# Patient Record
Sex: Female | Born: 1965 | Race: White | Hispanic: No | Marital: Single | Smoking: Never smoker
Health system: Southern US, Community
[De-identification: ages and names within clinical notes are randomized; demographics above are authoritative.]

## PROBLEM LIST (undated history)

## (undated) DIAGNOSIS — D472 Monoclonal gammopathy: Secondary | ICD-10-CM

## (undated) DIAGNOSIS — H8109 Meniere's disease, unspecified ear: Secondary | ICD-10-CM

## (undated) DIAGNOSIS — I2510 Atherosclerotic heart disease of native coronary artery without angina pectoris: Secondary | ICD-10-CM

## (undated) DIAGNOSIS — N390 Urinary tract infection, site not specified: Secondary | ICD-10-CM

## (undated) DIAGNOSIS — R7303 Prediabetes: Secondary | ICD-10-CM

## (undated) DIAGNOSIS — G5603 Carpal tunnel syndrome, bilateral upper limbs: Secondary | ICD-10-CM

## (undated) DIAGNOSIS — M791 Myalgia, unspecified site: Secondary | ICD-10-CM

## (undated) DIAGNOSIS — N200 Calculus of kidney: Secondary | ICD-10-CM

## (undated) DIAGNOSIS — G8929 Other chronic pain: Secondary | ICD-10-CM

## (undated) DIAGNOSIS — G43909 Migraine, unspecified, not intractable, without status migrainosus: Secondary | ICD-10-CM

## (undated) HISTORY — DX: Calculus of kidney: N20.0

## (undated) HISTORY — PX: CHOLECYSTECTOMY: SHX55

## (undated) HISTORY — DX: Meniere's disease, unspecified ear: H81.09

## (undated) HISTORY — DX: Myalgia, unspecified site: M79.10

## (undated) HISTORY — PX: APPENDECTOMY: SHX54

## (undated) HISTORY — DX: Monoclonal gammopathy: D47.2

## (undated) HISTORY — DX: Migraine, unspecified, not intractable, without status migrainosus: G43.909

## (undated) HISTORY — PX: ESOPHAGOGASTRODUODENOSCOPY: SHX1529

---
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
## 1998-04-22 ENCOUNTER — Other Ambulatory Visit: Admission: RE | Admit: 1998-04-22 | Discharge: 1998-04-22 | Payer: Self-pay | Admitting: Obstetrics and Gynecology

## 2000-07-09 ENCOUNTER — Other Ambulatory Visit: Admission: RE | Admit: 2000-07-09 | Discharge: 2000-07-09 | Payer: Self-pay | Admitting: Obstetrics and Gynecology

## 2005-06-30 ENCOUNTER — Ambulatory Visit (HOSPITAL_COMMUNITY): Admission: RE | Admit: 2005-06-30 | Discharge: 2005-06-30 | Payer: Self-pay | Admitting: Family Medicine

## 2005-07-02 ENCOUNTER — Ambulatory Visit (HOSPITAL_COMMUNITY): Admission: RE | Admit: 2005-07-02 | Discharge: 2005-07-02 | Payer: Self-pay | Admitting: Family Medicine

## 2005-11-09 ENCOUNTER — Other Ambulatory Visit: Admission: RE | Admit: 2005-11-09 | Discharge: 2005-11-09 | Payer: Self-pay | Admitting: Obstetrics & Gynecology

## 2005-11-17 ENCOUNTER — Encounter: Admission: RE | Admit: 2005-11-17 | Discharge: 2005-11-17 | Payer: Self-pay | Admitting: Orthopedic Surgery

## 2005-11-27 ENCOUNTER — Ambulatory Visit (HOSPITAL_COMMUNITY): Admission: RE | Admit: 2005-11-27 | Discharge: 2005-11-27 | Payer: Self-pay | Admitting: Obstetrics & Gynecology

## 2006-04-26 ENCOUNTER — Encounter: Admission: RE | Admit: 2006-04-26 | Discharge: 2006-04-26 | Payer: Self-pay | Admitting: Obstetrics & Gynecology

## 2006-07-28 ENCOUNTER — Emergency Department (HOSPITAL_COMMUNITY): Admission: EM | Admit: 2006-07-28 | Discharge: 2006-07-28 | Payer: Self-pay | Admitting: Emergency Medicine

## 2006-09-27 HISTORY — PX: COLONOSCOPY W/ ENDOSCOPIC US: SHX1379

## 2007-05-02 ENCOUNTER — Ambulatory Visit (HOSPITAL_COMMUNITY): Admission: RE | Admit: 2007-05-02 | Discharge: 2007-05-02 | Payer: Self-pay | Admitting: Family Medicine

## 2007-09-06 ENCOUNTER — Other Ambulatory Visit: Admission: RE | Admit: 2007-09-06 | Discharge: 2007-09-06 | Payer: Self-pay | Admitting: Obstetrics & Gynecology

## 2007-12-05 ENCOUNTER — Ambulatory Visit: Payer: Self-pay | Admitting: Hematology and Oncology

## 2007-12-16 LAB — CBC WITH DIFFERENTIAL/PLATELET
BASO%: 0.6 % (ref 0.0–2.0)
EOS%: 2.1 % (ref 0.0–7.0)
LYMPH%: 38.1 % (ref 14.0–48.0)
MCH: 26.5 pg (ref 26.0–34.0)
MCHC: 34.1 g/dL (ref 32.0–36.0)
MONO#: 0.4 10*3/uL (ref 0.1–0.9)
RBC: 4.97 10*6/uL (ref 3.70–5.32)
WBC: 6 10*3/uL (ref 3.9–10.0)
lymph#: 2.3 10*3/uL (ref 0.9–3.3)

## 2007-12-16 LAB — ERYTHROCYTE SEDIMENTATION RATE: Sed Rate: 5 mm/hr (ref 0–30)

## 2007-12-21 ENCOUNTER — Ambulatory Visit (HOSPITAL_COMMUNITY): Admission: RE | Admit: 2007-12-21 | Discharge: 2007-12-21 | Payer: Self-pay | Admitting: Hematology and Oncology

## 2007-12-21 LAB — SPEP & IFE WITH QIG
Albumin ELP: 63.2 % (ref 55.8–66.1)
Beta 2: 5.3 % (ref 3.2–6.5)
Beta Globulin: 5.9 % (ref 4.7–7.2)
Gamma Globulin: 11.7 % (ref 11.1–18.8)
IgA: 236 mg/dL (ref 68–378)
IgG (Immunoglobin G), Serum: 860 mg/dL (ref 694–1618)
IgM, Serum: 67 mg/dL (ref 60–263)
M-Spike, %: 0.23 g/dL

## 2007-12-21 LAB — COMPREHENSIVE METABOLIC PANEL
ALT: 28 U/L (ref 0–35)
AST: 25 U/L (ref 0–37)
Alkaline Phosphatase: 44 U/L (ref 39–117)
Calcium: 9.2 mg/dL (ref 8.4–10.5)
Chloride: 105 mEq/L (ref 96–112)
Creatinine, Ser: 0.77 mg/dL (ref 0.40–1.20)
Potassium: 4.3 mEq/L (ref 3.5–5.3)

## 2007-12-21 LAB — KAPPA/LAMBDA LIGHT CHAINS
Kappa:Lambda Ratio: 0.99 (ref 0.26–1.65)
Lambda Free Lght Chn: 1.05 mg/dL (ref 0.57–2.63)

## 2007-12-23 LAB — UIFE/LIGHT CHAINS/TP QN, 24-HR UR
Albumin, U: DETECTED
Total Protein, Urine-Ur/day: 14 mg/d (ref 10–140)

## 2007-12-25 ENCOUNTER — Emergency Department (HOSPITAL_COMMUNITY): Admission: EM | Admit: 2007-12-25 | Discharge: 2007-12-25 | Payer: Self-pay | Admitting: Emergency Medicine

## 2008-05-10 ENCOUNTER — Encounter: Admission: RE | Admit: 2008-05-10 | Discharge: 2008-05-10 | Payer: Self-pay | Admitting: Orthopedic Surgery

## 2008-05-24 ENCOUNTER — Encounter: Admission: RE | Admit: 2008-05-24 | Discharge: 2008-05-24 | Payer: Self-pay | Admitting: Orthopedic Surgery

## 2008-07-27 ENCOUNTER — Ambulatory Visit: Payer: Self-pay | Admitting: Cardiovascular Disease

## 2008-07-27 ENCOUNTER — Ambulatory Visit: Payer: Self-pay

## 2008-08-06 ENCOUNTER — Encounter: Payer: Self-pay | Admitting: Cardiovascular Disease

## 2008-08-06 ENCOUNTER — Ambulatory Visit: Payer: Self-pay

## 2008-08-10 ENCOUNTER — Ambulatory Visit (HOSPITAL_COMMUNITY): Admission: RE | Admit: 2008-08-10 | Discharge: 2008-08-10 | Payer: Self-pay | Admitting: Cardiovascular Disease

## 2008-08-10 ENCOUNTER — Ambulatory Visit: Payer: Self-pay | Admitting: Internal Medicine

## 2008-09-03 ENCOUNTER — Ambulatory Visit: Payer: Self-pay | Admitting: Cardiovascular Disease

## 2008-09-10 ENCOUNTER — Ambulatory Visit: Payer: Self-pay | Admitting: Cardiovascular Disease

## 2008-12-12 ENCOUNTER — Ambulatory Visit: Payer: Self-pay | Admitting: Hematology and Oncology

## 2008-12-19 LAB — CBC WITH DIFFERENTIAL/PLATELET
EOS%: 2.2 % (ref 0.0–7.0)
Eosinophils Absolute: 0.2 10*3/uL (ref 0.0–0.5)
MCH: 26.2 pg (ref 25.1–34.0)
MCHC: 32.8 g/dL (ref 31.5–36.0)
MONO%: 6.2 % (ref 0.0–14.0)
NEUT#: 5.3 10*3/uL (ref 1.5–6.5)
RBC: 4.85 10*6/uL (ref 3.70–5.45)
RDW: 14.9 % — ABNORMAL HIGH (ref 11.2–14.5)
WBC: 8.5 10*3/uL (ref 3.9–10.3)
lymph#: 2.5 10*3/uL (ref 0.9–3.3)

## 2008-12-21 LAB — SPEP & IFE WITH QIG
Beta 2: 4.4 % (ref 3.2–6.5)
Beta Globulin: 6.8 % (ref 4.7–7.2)
Gamma Globulin: 11.6 % (ref 11.1–18.8)
IgA: 201 mg/dL (ref 68–378)

## 2008-12-21 LAB — COMPREHENSIVE METABOLIC PANEL
ALT: 39 U/L — ABNORMAL HIGH (ref 0–35)
AST: 25 U/L (ref 0–37)
Albumin: 4.5 g/dL (ref 3.5–5.2)
BUN: 12 mg/dL (ref 6–23)
CO2: 23 mEq/L (ref 19–32)
Chloride: 103 mEq/L (ref 96–112)
Glucose, Bld: 113 mg/dL — ABNORMAL HIGH (ref 70–99)
Potassium: 4.4 mEq/L (ref 3.5–5.3)
Total Protein: 6.8 g/dL (ref 6.0–8.3)

## 2009-02-08 ENCOUNTER — Encounter: Admission: RE | Admit: 2009-02-08 | Discharge: 2009-02-08 | Payer: Self-pay | Admitting: Sports Medicine

## 2009-03-05 ENCOUNTER — Encounter: Admission: RE | Admit: 2009-03-05 | Discharge: 2009-03-05 | Payer: Self-pay | Admitting: Sports Medicine

## 2009-03-14 ENCOUNTER — Encounter (INDEPENDENT_AMBULATORY_CARE_PROVIDER_SITE_OTHER): Payer: Self-pay | Admitting: *Deleted

## 2009-07-10 ENCOUNTER — Encounter: Admission: RE | Admit: 2009-07-10 | Discharge: 2009-07-10 | Payer: Self-pay | Admitting: Sports Medicine

## 2010-02-13 ENCOUNTER — Ambulatory Visit: Payer: Self-pay | Admitting: Hematology and Oncology

## 2010-03-06 ENCOUNTER — Ambulatory Visit (HOSPITAL_COMMUNITY): Admission: RE | Admit: 2010-03-06 | Discharge: 2010-03-06 | Payer: Self-pay | Admitting: Family Medicine

## 2010-04-29 ENCOUNTER — Ambulatory Visit: Payer: Self-pay | Admitting: Hematology and Oncology

## 2010-05-01 LAB — CBC WITH DIFFERENTIAL/PLATELET
BASO%: 0.9 % (ref 0.0–2.0)
EOS%: 1.6 % (ref 0.0–7.0)
Eosinophils Absolute: 0.1 10*3/uL (ref 0.0–0.5)
HCT: 41.1 % (ref 34.8–46.6)
HGB: 13.8 g/dL (ref 11.6–15.9)
LYMPH%: 35.6 % (ref 14.0–49.7)
MCH: 27.5 pg (ref 25.1–34.0)
MCHC: 33.7 g/dL (ref 31.5–36.0)
NEUT#: 3.5 10*3/uL (ref 1.5–6.5)
RBC: 5.03 10*6/uL (ref 3.70–5.45)
WBC: 6.3 10*3/uL (ref 3.9–10.3)

## 2010-05-05 LAB — IGG, IGA, IGM
IgA: 190 mg/dL (ref 68–378)
IgG (Immunoglobin G), Serum: 900 mg/dL (ref 694–1618)
IgM, Serum: 66 mg/dL (ref 60–263)

## 2010-05-05 LAB — PROTEIN ELECTROPHORESIS, SERUM
Albumin ELP: 61.7 % (ref 55.8–66.1)
Alpha-1-Globulin: 4 % (ref 2.9–4.9)
Beta 2: 5.1 % (ref 3.2–6.5)
Beta Globulin: 6.1 % (ref 4.7–7.2)
Gamma Globulin: 12.4 % (ref 11.1–18.8)

## 2010-05-05 LAB — COMPREHENSIVE METABOLIC PANEL
AST: 20 U/L (ref 0–37)
BUN: 10 mg/dL (ref 6–23)
Calcium: 9.4 mg/dL (ref 8.4–10.5)
Potassium: 4.1 mEq/L (ref 3.5–5.3)
Total Bilirubin: 0.4 mg/dL (ref 0.3–1.2)

## 2010-11-01 ENCOUNTER — Encounter: Payer: Self-pay | Admitting: Obstetrics & Gynecology

## 2010-11-02 ENCOUNTER — Encounter: Payer: Self-pay | Admitting: Obstetrics & Gynecology

## 2010-11-02 ENCOUNTER — Encounter: Payer: Self-pay | Admitting: Cardiovascular Disease

## 2010-11-03 ENCOUNTER — Encounter: Payer: Self-pay | Admitting: Orthopedic Surgery

## 2010-12-11 ENCOUNTER — Ambulatory Visit (HOSPITAL_COMMUNITY)
Admission: RE | Admit: 2010-12-11 | Discharge: 2010-12-11 | Disposition: A | Discharge status: Home / Self Care | Payer: BC Managed Care – PPO | Source: Ambulatory Visit | Attending: Family Medicine | Admitting: Family Medicine

## 2010-12-11 ENCOUNTER — Other Ambulatory Visit (HOSPITAL_COMMUNITY): Payer: Self-pay | Admitting: Family Medicine

## 2010-12-11 DIAGNOSIS — R109 Unspecified abdominal pain: Secondary | ICD-10-CM | POA: Insufficient documentation

## 2010-12-11 DIAGNOSIS — K5909 Other constipation: Secondary | ICD-10-CM | POA: Insufficient documentation

## 2011-02-24 NOTE — Assessment & Plan Note (Signed)
[facility]                            CARDIOLOGY OFFICE NOTE   DER, GAGLIANO                      MRN:          [id number]  DATE:09/03/2008                            DOB:          12/24/1965    Lisa Li is seen today in followup.  She has been seen for  palpitations, rapid heart rate, hypertension, and atypical chest pain.  Her cardiac tests were normal.  She has normal stress test.  Her CPX  test that showed that she had no cardiopulmonary limitations to her  exercise.   Her urine test did show elevated catechols.  Her total 24-hour urine  catechols were 128 with an upper limit of normal being 100.  Her  norepinephrine level was 125 with an upper limit of normal being less  than 80.  Total urine volume was 520, which was good.  Her total urine  volume was 2500, which was good.  A 24-hour urine dopamine was also  elevated at 520.   I told the patient that we needed to work this up further since she  tends to have hypertension and palpitations.  She will get a CT scan to  check her renals.  We will try to refer her to an endocrinologist.  I  would like to repeat her 24-hour urine to make sure these results are  reproducible and accurate.   I had a long discussion with Lisa Li about this.  She tends to have a lot  of questions.  I explained to her that I was not sure of any particular  diagnosis regarding FEO, but certainly we need to look into it further.  Her chest pain and palpitations seemed benign.  She does not appear to  have significant cardiac disease.  She had been on a beta-blocker before  for her blood pressure, but it caused alopecia and this was stopped.  Her blood pressure seems stable at this point.   REVIEW OF SYSTEMS:  Otherwise negative.   MEDICATIONS:  1. [name].  2. Vitamin D.  3. Fish oil.  4. Flexeril.  5. NuvaRing.   PHYSICAL EXAMINATION:  GENERAL:  Remarkable for young white female in no  distress.  She does have a bit of a flushed face. VITAL SIGNS:  Blood  pressure is 134/88, pulse 90 and regular, respiratory rate 14, and  afebrile.  HEENT:  Unremarkable.  NECK:  Carotids are normal without bruit.  No lymphadenopathy,  thyromegaly, or JVP elevation.  LUNGS:  Clear.  Good diaphragmatic motion.  No wheezing.  S1 and S2.  Normal heart sounds.  PMI normal.  ABDOMEN:  Benign.  Bowel sounds positive.  No AAA.  No tenderness.  No  bruit.  No hepatosplenomegaly.  No hepatojugular reflux.  EXTREMITIES:  Distal pulses are intact.  No edema.  NEURO:  Nonfocal.  SKIN:  Warm and dry.  MUSCULOSKELETAL:  No muscular weakness.   IMPRESSION:  1. Chest pain, atypical.  No evidence of significant coronary artery      disease.  Continue baby aspirin a day.  2. Dyspnea with cardiopulmonary  exercise showing no cardiopulmonary      limitation.  Continue exercise program.  3. Elevated catecholamines in the urine with a tendency towards      hypertension and palpitations.  At some point, we may try to get      her back on a beta-blocker that will not cause alopecia.  She will      have a repeat 24-hour urine and followup with endocrine and we will      do a CT of her adrenals.  4. Anxiety and depression.  Followup primary care MD.  5. History of reflux.  Continue Nexium.  6. Musculoskeletal pain.  Continue Flexeril p.r.n.  7. Birth control.  Continue a NuvaRing.     Noralyn Pick. Eden Emms, MD, [facility]  Electronically Signed    PCN/MedQ  DD: 09/03/2008  DT: 09/04/2008  Job #: [id number]

## 2011-02-24 NOTE — Assessment & Plan Note (Signed)
[facility]                            CARDIOLOGY OFFICE NOTE   Lisa Li, Lisa Li                      MRN:          [id number]  DATE:07/27/2008                            DOB:          [birth year]    [name] is a 45 year old patient referred by Lilyan Punt, I  actually took care of her mother while in the hospital up at [facility],  she had end-stage COPD.  Unfortunately, her father currently has  prostate cancer.   Lisa Li is 45 years old and she has been having issues with palpitations,  rapid heart rate, and hypertension.   She dates the issues with her heart rate and blood pressure back to you  in September 2006.  At that point she noticed a pounding in her ears at  night and thought that her heart rate was higher than usual.  She  indicates that she would always been physically active working multiple  jobs, particularly in the nursery.  She has since had progressive  fatigue and shortness of breath.  She has had dizziness.   She subsequently developed some paresthesias in her legs and some  chronic hip pain.  She saw Dr. Thurston Hole for her hip issues and saw Dr.  Sandria Manly.  She had a workup for what appears to be some sort of polyclonal  myopathy, but I do not have any of the notes.   In regards to her heart, she has had some atypical chest pain.  She  feels a squeezing sensation in her chest.  It is not necessarily  exertional and can radiate to the shoulders.   She feels that she gets dyspnea which is disproportionate to her  activity levels.   She also has had palpitations.  She feels that her heart beats too  quickly.   She has been seen by Dr. Gerda Diss and her blood pressure has been on the  high side.  She seems a bit sensitive to medication.  She only tolerates  half of a metoprolol 50 mg tablet.  Apparently, on higher doses, she  felt too fatigue.  She has not been tried on other blood pressure  medicines.   Her review of systems  is otherwise remarkable for some question of  recent onset of cognitive problems in regards to forgetting transferring  money to back accounts and slower mentation.   Otherwise, she has not had any frank syncope.  She has not had focal  neurological deficits.   Her past medical history is otherwise remarkable for previous  appendectomy, previous cholecystectomy, previous tonsillectomy.   The patient is single.  She checks in on her father every day.  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  She  works part-time as Copy at a coffee shop and is currently trying to  take courses to get into the [place] tech [employer] program.   FAMILY HISTORY:  Remarkable for mother dying at age 33 of emphysema.  Father is alive and has prostate cancer.   She is currently on [name], vitamin  D, metoprolol 25 a day,  fish oil, Flexeril, and NuvaRing.   PHYSICAL EXAMINATION:  GENERAL:  Her exam is remarkable for somewhat  anxious white female with a flushed face with pressurized speech.  VITAL SIGNS:  Her blood pressure is 140/88, pulse is 95-100, respiratory  rate 14, afebrile, weight 183.  HEENT:  Unremarkable.  Carotids are without bruit.  No lymphadenopathy  or thyromegaly, JVP elevation.  LUNGS:  Clear to diaphragmatic motion.  No wheezing.  S1 and S2, normal  heart sounds.  PMI normal.  ABDOMEN:  Benign.  Bowel sounds positive.  No AAA, no tenderness, no  bruit, no hepatosplenomegaly, hepatojugular reflux, no tenderness.  EXTREMITIES:  Distal pulse was intact.  No edema.  NEURO:  Nonfocal.  SKIN:  Warm and dry.  No muscular weakness.  Dr. Lorin Picket Luking's notes  were reviewed.  Lab work from his office was reviewed.  Her EKG is  totally normal.   Her lab work shows TSH of 3.  She is not anemic.  There is no renal  disease.   IMPRESSION:  1. Palpitations.  Check event monitor for 4 weeks.  Check 24-hour      urine for metanephrines.  2. Dyspnea.   Disproportionate to exam.  Check 2-D echocardiogram,      assess RV and LV function, rule out occult pulmonary hypertension.      Check CPX to see if she has a deconditioning response, the O2 max,      limitation or cardiac output limitation.  3. Relative tachycardia.  I think this is benign.  Her TSH is fine.      She is not anemic.  Continue low-dose beta-blocker.  4. Question recurrent neurological issues.  She may need to see Dr.      Sandria Manly again.  I wonder at some point if she should have an MRI of      her head given some of her neurological symptoms and possible more      central etiology to her high blood pressure.  5. History of reflux.  Continue [name].  6. Chronic hip pain.  Follow with Dr. Thurston Hole.  Continue Flexeril      p.r.n.  7. Likely anxiety and depression.  In talking to the patient she      really does seem that she has some anxiety about her health.  It      seems disproportionate to any actual physical findings.  I wonder      if this is part of the issue in regards to her blood pressure and      heart rate swings, hopefully Dr. Gerda Diss will address this with her      further after her testing is done.  I will see her back in 6 weeks.     Noralyn Pick. Eden Emms, MD, [facility]  Electronically Signed    PCN/MedQ  DD: 07/27/2008  DT: 07/28/2008  Job #: [id number]

## 2011-06-17 ENCOUNTER — Other Ambulatory Visit: Payer: Self-pay | Admitting: Hematology and Oncology

## 2011-06-17 ENCOUNTER — Encounter (HOSPITAL_BASED_OUTPATIENT_CLINIC_OR_DEPARTMENT_OTHER): Payer: BC Managed Care – PPO | Admitting: Hematology and Oncology

## 2011-06-17 DIAGNOSIS — D472 Monoclonal gammopathy: Secondary | ICD-10-CM

## 2011-06-17 DIAGNOSIS — G609 Hereditary and idiopathic neuropathy, unspecified: Secondary | ICD-10-CM

## 2011-06-17 LAB — CBC WITH DIFFERENTIAL/PLATELET
BASO%: 0.2 % (ref 0.0–2.0)
Eosinophils Absolute: 0.2 10*3/uL (ref 0.0–0.5)
HCT: 41.4 % (ref 34.8–46.6)
LYMPH%: 30.4 % (ref 14.0–49.7)
MCHC: 33.2 g/dL (ref 31.5–36.0)
MONO#: 0.6 10*3/uL (ref 0.1–0.9)
NEUT#: 5.3 10*3/uL (ref 1.5–6.5)
NEUT%: 60.3 % (ref 38.4–76.8)
Platelets: 237 10*3/uL (ref 145–400)
WBC: 8.9 10*3/uL (ref 3.9–10.3)
lymph#: 2.7 10*3/uL (ref 0.9–3.3)

## 2011-06-19 LAB — COMPREHENSIVE METABOLIC PANEL
ALT: 27 U/L (ref 0–35)
Albumin: 4.6 g/dL (ref 3.5–5.2)
CO2: 24 mEq/L (ref 19–32)
Calcium: 9.7 mg/dL (ref 8.4–10.5)
Chloride: 103 mEq/L (ref 96–112)
Creatinine, Ser: 0.74 mg/dL (ref 0.50–1.10)
Sodium: 137 mEq/L (ref 135–145)
Total Protein: 6.9 g/dL (ref 6.0–8.3)

## 2011-06-19 LAB — SPEP & IFE WITH QIG
Alpha-2-Globulin: 10.5 % (ref 7.1–11.8)
Beta 2: 5.1 % (ref 3.2–6.5)
Beta Globulin: 6.5 % (ref 4.7–7.2)
Gamma Globulin: 12.8 % (ref 11.1–18.8)
IgG (Immunoglobin G), Serum: 999 mg/dL (ref 690–1700)
M-Spike, %: 0.46 g/dL

## 2011-06-23 ENCOUNTER — Encounter (HOSPITAL_BASED_OUTPATIENT_CLINIC_OR_DEPARTMENT_OTHER): Payer: BC Managed Care – PPO | Admitting: Hematology and Oncology

## 2011-06-23 DIAGNOSIS — D472 Monoclonal gammopathy: Secondary | ICD-10-CM

## 2011-07-06 LAB — CBC
HCT: 33.8 — ABNORMAL LOW
Hemoglobin: 11.7 — ABNORMAL LOW
RBC: 4.3
RDW: 14
WBC: 7.6

## 2011-07-06 LAB — BASIC METABOLIC PANEL
GFR calc Af Amer: >60
GFR calc non Af Amer: >60
Glucose, Bld: 153 — ABNORMAL HIGH
Potassium: 3.3 — ABNORMAL LOW
Sodium: 139

## 2011-07-06 LAB — DIFFERENTIAL
Eosinophils Relative: 2
Lymphocytes Relative: 36
Lymphs Abs: 2.8
Monocytes Absolute: 0.5
Monocytes Relative: 7
Neutro Abs: 4.2

## 2011-07-06 LAB — URINE MICROSCOPIC-ADD ON

## 2011-07-06 LAB — URINALYSIS, ROUTINE W REFLEX MICROSCOPIC
Bilirubin Urine: NEGATIVE
Ketones, ur: NEGATIVE
Nitrite: NEGATIVE
Urobilinogen, UA: 0.2
pH: 5.5

## 2011-07-06 LAB — PREGNANCY, URINE: Preg Test, Ur: NEGATIVE

## 2012-03-24 ENCOUNTER — Encounter (INDEPENDENT_AMBULATORY_CARE_PROVIDER_SITE_OTHER): Payer: Self-pay | Admitting: Surgery

## 2012-03-28 ENCOUNTER — Encounter (INDEPENDENT_AMBULATORY_CARE_PROVIDER_SITE_OTHER): Payer: Self-pay | Admitting: Surgery

## 2012-03-28 ENCOUNTER — Ambulatory Visit (INDEPENDENT_AMBULATORY_CARE_PROVIDER_SITE_OTHER): Payer: BC Managed Care – PPO | Admitting: Surgery

## 2012-03-28 VITALS — BP 130/88 | HR 76 | Temp 98.2°F | Resp 14 | Ht 63.0 in | Wt 178.0 lb

## 2012-03-28 DIAGNOSIS — K648 Other hemorrhoids: Secondary | ICD-10-CM | POA: Insufficient documentation

## 2012-03-28 DIAGNOSIS — K601 Chronic anal fissure: Secondary | ICD-10-CM

## 2012-03-28 DIAGNOSIS — K602 Anal fissure, unspecified: Secondary | ICD-10-CM

## 2012-03-28 NOTE — Progress Notes (Signed)
Patient ID: Lisa Li, female   DOB: 11-13-1965, 46 y.o.   MRN: [id number]  Chief Complaint  Patient presents with  . Rectal Problems    hemorrhoids    HPI Lisa Li is a 46 y.o. female.  This is a pleasant female referred by Dr. Gerda Diss for evaluation of bleeding hemorrhoids. She has had hemorrhoids and fissures for at least 10 years. She has chronic constipation. Most of time he will improve with steroids. She still has intermittent bleeding but minimal discomfort so she came in for evaluation. She has no issues with continence. She has improved with her last dose of steroid suppositories. HPI  Past Medical History  Diagnosis Date  . Migraine headache     Past Surgical History  Procedure Date  . Appendectomy   . Cholecystectomy   . Tonsillectomy 1973    Family History  Problem Relation Age of Onset  . Cancer Father     Prostate    Social History History  Substance Use Topics  . Smoking status: Never Smoker   . Smokeless tobacco: Never Used  . Alcohol Use: No    Allergies  Allergen Reactions  . Triptans Hives    Current Outpatient Prescriptions  Medication Sig Dispense Refill  . AMITIZA 24 MCG capsule       . DEXILANT 60 MG capsule       . diazepam (VALIUM) 5 MG tablet       . docusate sodium (COLACE) 100 MG capsule Take 100 mg by mouth 2 (two) times daily.      Marland Kitchen ERRIN 0.35 MG tablet       . FLUoxetine (PROZAC) 10 MG tablet Take 10 mg by mouth daily.      . hydrocortisone (ANUSOL-HC) 25 MG suppository Place 25 mg rectally 2 (two) times daily.      Marland Kitchen triamcinolone (NASACORT) 55 MCG/ACT nasal inhaler         Review of Systems Review of Systems  Constitutional: Negative for fever, chills and unexpected weight change.  HENT: Negative for hearing loss, congestion, sore throat, trouble swallowing and voice change.   Eyes: Negative for visual disturbance.  Respiratory: Negative for cough and wheezing.   Cardiovascular: Negative for chest pain,  palpitations and leg swelling.  Gastrointestinal: Negative for nausea, vomiting, abdominal pain, diarrhea, constipation, blood in stool, abdominal distention and anal bleeding.  Genitourinary: Negative for hematuria, vaginal bleeding and difficulty urinating.  Musculoskeletal: Negative for arthralgias.  Skin: Negative for rash and wound.  Neurological: Negative for seizures, syncope and headaches.  Hematological: Negative for adenopathy. Does not bruise/bleed easily.  Psychiatric/Behavioral: Negative for confusion.    Blood pressure 130/88, pulse 76, temperature 98.2 F (36.8 C), temperature source Temporal, resp. rate 14, height 5\' 3"  (1.6 m), weight 178 lb (80.74 kg).  Physical Exam Physical Exam  Constitutional: She is oriented to person, place, and time. She appears well-developed and well-nourished. No distress.  HENT:  Head: Normocephalic and atraumatic.  Right Ear: External ear normal.  Left Ear: External ear normal.  Nose: Nose normal.  Mouth/Throat: Oropharynx is clear and moist. No oropharyngeal exudate.  Eyes: Conjunctivae are normal. Pupils are equal, round, and reactive to light.  Neck: Normal range of motion. Neck supple. No tracheal deviation present. No thyromegaly present.  Cardiovascular: Normal rate, regular rhythm, normal heart sounds and intact distal pulses.   No murmur heard. Pulmonary/Chest: Effort normal and breath sounds normal. No respiratory distress. She has no wheezes. She has no rales.  Genitourinary:       An external, distal, and Anoscopy exam were performed.  She has a chronic anterior fissure as well as a hemorrhoid at the 5:00 position. This was an internal hemorrhoid. I was able to band it.  Musculoskeletal: Normal range of motion. She exhibits no edema and no tenderness.  Lymphadenopathy:    She has no cervical adenopathy.  Neurological: She is alert and oriented to person, place, and time.  Skin: Skin is warm and dry. No rash noted. No erythema.   Psychiatric: Her behavior is normal. Thought content normal.    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 She will continue her stool softeners. I will see her back in a month to see if the banding his health. We will hold on any surgery regarding a fissure or hemorrhoid for now.       Leaf Kernodle A 03/28/2012, 8:55 AM

## 2012-05-03 ENCOUNTER — Encounter (INDEPENDENT_AMBULATORY_CARE_PROVIDER_SITE_OTHER): Payer: Self-pay | Admitting: Surgery

## 2012-05-03 ENCOUNTER — Ambulatory Visit (INDEPENDENT_AMBULATORY_CARE_PROVIDER_SITE_OTHER): Payer: BC Managed Care – PPO | Admitting: Surgery

## 2012-05-03 ENCOUNTER — Encounter (INDEPENDENT_AMBULATORY_CARE_PROVIDER_SITE_OTHER): Payer: BC Managed Care – PPO | Admitting: Surgery

## 2012-05-03 VITALS — BP 130/84 | HR 78 | Temp 97.2°F | Resp 16 | Ht 63.0 in | Wt 180.6 lb

## 2012-05-03 DIAGNOSIS — K602 Anal fissure, unspecified: Secondary | ICD-10-CM

## 2012-05-03 DIAGNOSIS — K601 Chronic anal fissure: Secondary | ICD-10-CM

## 2012-05-03 NOTE — Progress Notes (Signed)
Subjective:     Patient ID: Lisa Li, female   DOB: [birth year], 46 y.o.   MRN: [id number]  HPI She is here for a followup of her anal fissure and hemorrhoids. She still has discomfort and intermittent bleeding  Review of Systems     Objective:   Physical Exam On exam, the posterior anal fissure remains present with a little bit of bleeding scan. On digital exam the internal hemorrhoid has regressed    Assessment:     Chronic anal fissure and internal hemorrhoids    Plan:     We again had discussion about her rectal problems. We again discussed conservative measures for surgery. She was to continue conservatively or to fill is reasonable given her mild discomfort and symptoms. I am going to try her on nitroglycerin ointment and lidocaine cream. I will see her back in one month

## 2012-05-10 ENCOUNTER — Encounter (INDEPENDENT_AMBULATORY_CARE_PROVIDER_SITE_OTHER): Payer: BC Managed Care – PPO | Admitting: Surgery

## 2012-06-03 ENCOUNTER — Encounter (INDEPENDENT_AMBULATORY_CARE_PROVIDER_SITE_OTHER): Payer: BC Managed Care – PPO | Admitting: Surgery

## 2012-06-09 ENCOUNTER — Telehealth: Payer: Self-pay | Admitting: Hematology and Oncology

## 2012-06-09 NOTE — Telephone Encounter (Signed)
lmonvm for pt re appts for 9/18 and 9/20.

## 2012-06-09 NOTE — Telephone Encounter (Signed)
Add to previous note. Schedule mailed.

## 2012-06-29 ENCOUNTER — Other Ambulatory Visit: Payer: BC Managed Care – PPO | Admitting: Lab

## 2012-06-29 ENCOUNTER — Other Ambulatory Visit: Payer: Self-pay | Admitting: Hematology and Oncology

## 2012-07-01 ENCOUNTER — Ambulatory Visit: Payer: BC Managed Care – PPO | Admitting: Hematology and Oncology

## 2012-07-19 ENCOUNTER — Encounter (INDEPENDENT_AMBULATORY_CARE_PROVIDER_SITE_OTHER): Payer: BC Managed Care – PPO | Admitting: Surgery

## 2012-09-01 ENCOUNTER — Telehealth: Payer: Self-pay | Admitting: *Deleted

## 2012-09-01 NOTE — Telephone Encounter (Signed)
Received call from pt requesting f/u appt with md.   Pt stated she missed f/u appt in Sept. Due to school issue.   Pt stated she would be out of school  On 09/26/12.   Pt would like to reschedule f/u appt . Pt's   Cell   Phone      [phone]    ;      Home      [phone].

## 2012-09-06 ENCOUNTER — Telehealth: Payer: Self-pay | Admitting: Hematology and Oncology

## 2012-09-06 NOTE — Telephone Encounter (Signed)
Called cell and lmonvm re appts for 10/10/13 and 10/14/12. Not able to reach pt on home phone or leave complete message. Schedule also mailed.

## 2012-10-01 ENCOUNTER — Telehealth: Payer: Self-pay | Admitting: Oncology

## 2012-10-01 NOTE — Telephone Encounter (Signed)
lmonvm adviisng the pt to call me back regarding her jan appt needs to be rescheduled.

## 2012-10-07 ENCOUNTER — Other Ambulatory Visit: Payer: Self-pay | Admitting: *Deleted

## 2012-10-10 ENCOUNTER — Telehealth: Payer: Self-pay | Admitting: Oncology

## 2012-10-10 ENCOUNTER — Other Ambulatory Visit: Payer: Self-pay | Admitting: Oncology

## 2012-10-10 ENCOUNTER — Other Ambulatory Visit (HOSPITAL_BASED_OUTPATIENT_CLINIC_OR_DEPARTMENT_OTHER): Payer: BC Managed Care – PPO | Admitting: Lab

## 2012-10-10 DIAGNOSIS — D472 Monoclonal gammopathy: Secondary | ICD-10-CM

## 2012-10-10 LAB — CBC WITH DIFFERENTIAL/PLATELET
BASO%: 0.5 % (ref 0.0–2.0)
Basophils Absolute: 0 10*3/uL (ref 0.0–0.1)
EOS%: 1.8 % (ref 0.0–7.0)
HCT: 36.9 % (ref 34.8–46.6)
HGB: 12.3 g/dL (ref 11.6–15.9)
LYMPH%: 32.3 % (ref 14.0–49.7)
MCH: 26.2 pg (ref 25.1–34.0)
MCHC: 33.3 g/dL (ref 31.5–36.0)
MCV: 78.8 fL — ABNORMAL LOW (ref 79.5–101.0)
NEUT%: 59.2 % (ref 38.4–76.8)
Platelets: 224 10*3/uL (ref 145–400)

## 2012-10-10 LAB — COMPREHENSIVE METABOLIC PANEL (CC13)
ALT: 16 U/L (ref 0–55)
AST: 13 U/L (ref 5–34)
BUN: 11 mg/dL (ref 7.0–26.0)
Calcium: 9.3 mg/dL (ref 8.4–10.4)
Chloride: 108 mEq/L — ABNORMAL HIGH (ref 98–107)
Creatinine: 0.9 mg/dL (ref 0.6–1.1)
Total Bilirubin: 0.36 mg/dL (ref 0.20–1.20)

## 2012-10-10 NOTE — Telephone Encounter (Signed)
gv and printed appt schedule for pt for Dec and Jan ..the patient needed to r/s Jan 2014 appt due to school.Marland KitchenMarland KitchenDone

## 2012-10-13 LAB — SPEP & IFE WITH QIG
Alpha-1-Globulin: 3.8 % (ref 2.9–4.9)
Alpha-2-Globulin: 9.4 % (ref 7.1–11.8)
Gamma Globulin: 14 % (ref 11.1–18.8)
IgG (Immunoglobin G), Serum: 1100 mg/dL (ref 690–1700)
IgM, Serum: 64 mg/dL (ref 52–322)
M-Spike, %: 0.52 g/dL
Total Protein, Serum Electrophoresis: 6.6 g/dL (ref 6.0–8.3)

## 2012-10-13 LAB — KAPPA/LAMBDA LIGHT CHAINS
Kappa free light chain: 1.3 mg/dL (ref 0.33–1.94)
Lambda Free Lght Chn: 0.87 mg/dL (ref 0.57–2.63)

## 2012-10-14 ENCOUNTER — Ambulatory Visit: Payer: BC Managed Care – PPO | Admitting: Hematology and Oncology

## 2012-10-18 ENCOUNTER — Ambulatory Visit: Payer: BC Managed Care – PPO | Admitting: Oncology

## 2012-10-20 ENCOUNTER — Ambulatory Visit: Payer: BC Managed Care – PPO | Admitting: Oncology

## 2012-10-20 ENCOUNTER — Telehealth: Payer: Self-pay | Admitting: Oncology

## 2012-10-20 NOTE — Telephone Encounter (Signed)
Pt came in and needed to r/s appt ....done

## 2012-10-21 ENCOUNTER — Ambulatory Visit: Payer: BC Managed Care – PPO | Admitting: Oncology

## 2012-11-15 ENCOUNTER — Telehealth: Payer: Self-pay | Admitting: Oncology

## 2012-11-16 ENCOUNTER — Ambulatory Visit: Payer: BC Managed Care – PPO | Admitting: Oncology

## 2012-11-18 ENCOUNTER — Encounter (HOSPITAL_COMMUNITY): Payer: Self-pay

## 2012-11-18 ENCOUNTER — Ambulatory Visit (HOSPITAL_COMMUNITY)
Admission: RE | Admit: 2012-11-18 | Discharge: 2012-11-18 | Disposition: A | Discharge status: Home / Self Care | Payer: BC Managed Care – PPO | Source: Ambulatory Visit | Attending: Family Medicine | Admitting: Family Medicine

## 2012-11-18 ENCOUNTER — Other Ambulatory Visit: Payer: Self-pay | Admitting: Family Medicine

## 2012-11-18 DIAGNOSIS — R319 Hematuria, unspecified: Secondary | ICD-10-CM

## 2012-11-18 DIAGNOSIS — R109 Unspecified abdominal pain: Secondary | ICD-10-CM

## 2012-11-18 DIAGNOSIS — N201 Calculus of ureter: Secondary | ICD-10-CM | POA: Insufficient documentation

## 2012-11-18 DIAGNOSIS — R1032 Left lower quadrant pain: Secondary | ICD-10-CM | POA: Insufficient documentation

## 2012-11-18 MED ORDER — IOHEXOL 300 MG/ML  SOLN
125.0000 mL | Freq: Once | INTRAMUSCULAR | Status: AC | PRN
  Administered 2012-11-18: 125 mL via INTRAVENOUS

## 2012-12-26 ENCOUNTER — Other Ambulatory Visit: Payer: Self-pay | Admitting: Gynecology

## 2012-12-26 DIAGNOSIS — R1030 Lower abdominal pain, unspecified: Secondary | ICD-10-CM

## 2013-02-10 ENCOUNTER — Encounter: Payer: Self-pay | Admitting: *Deleted

## 2013-02-14 ENCOUNTER — Other Ambulatory Visit: Payer: Self-pay | Admitting: Gynecology

## 2013-02-14 ENCOUNTER — Ambulatory Visit (INDEPENDENT_AMBULATORY_CARE_PROVIDER_SITE_OTHER): Payer: BC Managed Care – PPO

## 2013-02-14 ENCOUNTER — Ambulatory Visit (INDEPENDENT_AMBULATORY_CARE_PROVIDER_SITE_OTHER): Payer: BC Managed Care – PPO | Admitting: Gynecology

## 2013-02-14 DIAGNOSIS — R1030 Lower abdominal pain, unspecified: Secondary | ICD-10-CM

## 2013-02-14 DIAGNOSIS — D3912 Neoplasm of uncertain behavior of left ovary: Secondary | ICD-10-CM

## 2013-02-14 DIAGNOSIS — N838 Other noninflammatory disorders of ovary, fallopian tube and broad ligament: Secondary | ICD-10-CM

## 2013-02-14 DIAGNOSIS — N839 Noninflammatory disorder of ovary, fallopian tube and broad ligament, unspecified: Secondary | ICD-10-CM

## 2013-02-14 DIAGNOSIS — R51 Headache: Secondary | ICD-10-CM

## 2013-02-14 DIAGNOSIS — D252 Subserosal leiomyoma of uterus: Secondary | ICD-10-CM

## 2013-02-14 DIAGNOSIS — N8300 Follicular cyst of ovary, unspecified side: Secondary | ICD-10-CM

## 2013-02-14 DIAGNOSIS — D259 Leiomyoma of uterus, unspecified: Secondary | ICD-10-CM

## 2013-02-14 DIAGNOSIS — R109 Unspecified abdominal pain: Secondary | ICD-10-CM

## 2013-02-14 DIAGNOSIS — G43909 Migraine, unspecified, not intractable, without status migrainosus: Secondary | ICD-10-CM

## 2013-02-14 MED ORDER — PROPRANOLOL HCL 10 MG PO TABS: 10.0000 mg | ORAL_TABLET | Freq: Three times a day (TID) | ORAL | Status: DC

## 2013-02-14 NOTE — Progress Notes (Signed)
Pt here for u/s done for pelvic pain.  Pt reports pain is intermittent but unchanged in severity.   U/S findings were reviewed with Pt.  A posterior subsersal fibroid was noted.  The left adnexa was remarkable for a cyst rooughly 3x3x3cm with single septation and an echogenic foci noted.  Mass is avascular, normal ovarian blood flow was noted, no free fluid in cul de sac. 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  Discussed surgical removal of left cyst or adnexa. Pt is agreeable.  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  Pt would prefer to have surgery after graduation in [facility] 2014.  We discussed signs and symptoms of torsion which would make delay impossible, she understands.  She is aware if her Ca125 is elevated, surgery should not be delayed as well.  Pt will present back to office for more formal discussion of risks and benefits of surgery. Questions were addressed.  In addition pt reports on errin for management of her headaches, she is allergic to tripans, pt also states that she has had borderline elevated BP'S at her other MD's offices, we suggested she try inderal to treat both, she is agreeable, we have asked to f/u her for a BP/headache check in 3w Length of time, >50% face to face discussing differential diagnosis and treatment of adnexal mass 

## 2013-02-15 DIAGNOSIS — N838 Other noninflammatory disorders of ovary, fallopian tube and broad ligament: Secondary | ICD-10-CM | POA: Insufficient documentation

## 2013-02-15 DIAGNOSIS — D252 Subserosal leiomyoma of uterus: Secondary | ICD-10-CM | POA: Insufficient documentation

## 2013-02-15 DIAGNOSIS — G43909 Migraine, unspecified, not intractable, without status migrainosus: Secondary | ICD-10-CM | POA: Insufficient documentation

## 2013-02-20 ENCOUNTER — Telehealth: Payer: Self-pay | Admitting: Gynecology

## 2013-02-20 NOTE — Telephone Encounter (Signed)
Patient wants to schedule a consultation with Dr. Hyacinth Li about recent ultrasound results she had with Dr. Farrel Gobble. Please advise patient and schedule.

## 2013-02-20 NOTE — Telephone Encounter (Signed)
Patient returning Lisa Li's call.

## 2013-02-21 ENCOUNTER — Ambulatory Visit (INDEPENDENT_AMBULATORY_CARE_PROVIDER_SITE_OTHER): Payer: BC Managed Care – PPO | Admitting: Obstetrics & Gynecology

## 2013-02-21 ENCOUNTER — Encounter: Payer: Self-pay | Admitting: Obstetrics & Gynecology

## 2013-02-21 VITALS — BP 118/80 | HR 60 | Resp 16

## 2013-02-21 DIAGNOSIS — D250 Submucous leiomyoma of uterus: Secondary | ICD-10-CM

## 2013-02-21 DIAGNOSIS — R198 Other specified symptoms and signs involving the digestive system and abdomen: Secondary | ICD-10-CM

## 2013-02-21 DIAGNOSIS — M25559 Pain in unspecified hip: Secondary | ICD-10-CM

## 2013-02-26 ENCOUNTER — Encounter: Payer: Self-pay | Admitting: Obstetrics & Gynecology

## 2013-02-26 NOTE — Patient Instructions (Signed)
We will try to schedule the ultrasound on June 20th to work with your scheduled

## 2013-02-26 NOTE — Progress Notes (Signed)
47 y.o. Partnered Caucasian female G0P0 here for follow up of recent ultrasound.  She recently saw Dr. Farrel Gobble for discussion of results but wants my opinion as well.  Patient has been experiencing some pelvic pain/pressure as well as some rectal pressure.  She feels that her stool has changed as well.  It seems more ribbon-like, almost like there is pressure on her rectum.    Images of ultrasound reviewed with patient and her partner.  She has a 4.7cmm posterior subserosal fibroid coming off the backside of the uterus that could account for her symptoms.  Her most recent pelvic ultrasound was 10/11 where she had what looked like two intramural fibroids--13 x 17mm and 13 x 14mm.  There has been growth since this ultrasound.    Also noted was a 3cm left septated cystic mass and an adjacent small, solid, avascular area.  Ca-125 was normal.  Follow-up ultrasound was recommended.  I reviewed images with patient.  I agree with current recommendation.  Questions answered.    Patient reports she has stopped her micronor and she feels like maybe her headaches are better .  Unfortunately, her cycles are worse.  She has been intolerant of estrogen containing methods for cycle control due to headaches.  She is not interested in an IUD.  We have discussed endometrial ablations in the past.    She is contemplating hysterectomy due to the fibroid.  She is not interested in a myomectomy.  She wants to discuss the surgery.  Laparoscopic approach discussed.  Risks including bleeding, infection, DVT/PE, bowel/bladder/ureteral/vascular injury discussed.  Hospital stay, recovery discussed.  Patient would not be able to do this until later in August.  We discussed pros/cons of ovary removal.  Current literature reviewed with patient recommending keeping ovaries.  She is very concerned about ovarian cancer and has been for years.  I do think her headaches would get better as well but I feel she would benefit from HRT at least for  a few years if she decided to have both ovaries removed.  We also discussed removal of tubes only and keeping ovaries.  Of course all of this would depend on whether the ovarian finding resolves or not.  She will think about all of this.    O: Healthy WD,WN female Affect:  No other physical exam performed  Assessment:  enlarging uterine fibroid Left septated ovarian cyst Pelvic pain/pressure DUB Headaches  Plan:  Repeat ultrasound in June to see if any change has occurred.  If ovary looks the same or fibroid enlarges, patient is definitely going to proceed.  For now, she will stay off her micronor and manage any pain with OTC meds.  Partner asked for RX for narcotics for patient but patient declines.  Feels she doesn't need this.  ~30 minutes spent with patient >50% of time was in face to face discussion of above.

## 2013-03-08 ENCOUNTER — Other Ambulatory Visit: Payer: Self-pay | Admitting: *Deleted

## 2013-03-08 ENCOUNTER — Telehealth: Payer: Self-pay | Admitting: *Deleted

## 2013-03-08 DIAGNOSIS — N83209 Unspecified ovarian cyst, unspecified side: Secondary | ICD-10-CM

## 2013-03-08 NOTE — Telephone Encounter (Signed)
Call to patient to schedule PUS appt. LMTCB

## 2013-03-14 ENCOUNTER — Telehealth: Payer: Self-pay | Admitting: Obstetrics & Gynecology

## 2013-03-14 NOTE — Telephone Encounter (Signed)
Patient was returning call to [facility]. She is not available Friday 6/20 in the AM due to her school schedule. She is available after noon on 6/20. Please call the patient.

## 2013-03-16 NOTE — Telephone Encounter (Signed)
Patient states she discussed with "Rosalita Chessman" that she is in class till 1pm.  Advised that DR Hyacinth Meeker has made an exception for her and opened the ultrasound schedule on a Friday to assist patient with schedule but that 12pm is the latest we can do unless she would like a MON, Tues or WED.  Patient states she will be here for 1200 PUS on 03-31-13.

## 2013-03-17 ENCOUNTER — Ambulatory Visit: Payer: BC Managed Care – PPO | Admitting: Gynecology

## 2013-03-23 ENCOUNTER — Other Ambulatory Visit: Payer: Self-pay | Admitting: Family Medicine

## 2013-03-23 NOTE — Telephone Encounter (Signed)
Ok for both plus 2 ref

## 2013-03-31 ENCOUNTER — Ambulatory Visit (INDEPENDENT_AMBULATORY_CARE_PROVIDER_SITE_OTHER): Payer: BC Managed Care – PPO

## 2013-03-31 ENCOUNTER — Ambulatory Visit (INDEPENDENT_AMBULATORY_CARE_PROVIDER_SITE_OTHER): Payer: BC Managed Care – PPO | Admitting: Obstetrics & Gynecology

## 2013-03-31 DIAGNOSIS — N926 Irregular menstruation, unspecified: Secondary | ICD-10-CM

## 2013-03-31 DIAGNOSIS — N83209 Unspecified ovarian cyst, unspecified side: Secondary | ICD-10-CM

## 2013-03-31 DIAGNOSIS — D252 Subserosal leiomyoma of uterus: Secondary | ICD-10-CM

## 2013-03-31 DIAGNOSIS — N939 Abnormal uterine and vaginal bleeding, unspecified: Secondary | ICD-10-CM

## 2013-03-31 DIAGNOSIS — R51 Headache: Secondary | ICD-10-CM

## 2013-03-31 DIAGNOSIS — N949 Unspecified condition associated with female genital organs and menstrual cycle: Secondary | ICD-10-CM

## 2013-03-31 DIAGNOSIS — R102 Pelvic and perineal pain: Secondary | ICD-10-CM

## 2013-03-31 DIAGNOSIS — D259 Leiomyoma of uterus, unspecified: Secondary | ICD-10-CM

## 2013-03-31 DIAGNOSIS — D251 Intramural leiomyoma of uterus: Secondary | ICD-10-CM

## 2013-03-31 MED ORDER — PROPRANOLOL HCL 10 MG PO TABS: 20.0000 mg | ORAL_TABLET | Freq: Two times a day (BID) | ORAL | Status: DC

## 2013-03-31 NOTE — Progress Notes (Signed)
47 y.o.Singlefemale here for a pelvic ultrasound to recheck ovary and fibroid uterus.  Patient still contemplating a hysterectomy.  Bleeding has been more irregular off of the Micronor.  She doesn't want to restart it at this time.If ovary still abnormal, she is definitively going to proceed.  Major issue with patient at this time is rectal pressure and concern about headaches.  She feels these are hormonally mediated and would consider removal of both ovaries if proceeds.  Would go on HRT at that time because of age.  Just not completely decided.  Partner broke femur last week--5 hour surgery and is non-weight bearing for 12 weeks.  Patient could not proceed with surgery for weeks if decides.  Also has question about propranolol.  Would like ER medication because of ease of use but I feel dosage is too big.  Will try to change to 20mg  BID.  Pt will let me know if having any side effects.  Patient's last menstrual period was 03/23/2013.  Sexually active:  yes  Contraception: no method, but not necessary  FINDINGS: UTERUS:  8.9 x 4.5 x 4.9cm with subserosal posterior fibroid ~4.5cm (no change) EMS: 3.38mm ADNEXA:   Left ovary:  2.7 x 1.7 x 1.8cm with 16 x 15mm avascular cyst, decreased in size, appears collapsed.  Periferal calcifications present but also avascular   Right ovary 2.6 x 1.9 x 1.9cm with corpus luteal cyst 18 x 13mm CUL DE SAC: neg  Images and findings reviewed personally with patient.  All questions answered.  She would like hormones checked and feels she will proceed with surgery.  Will precert with insurance.  Planning mid September  Assessment:  Fibroid uterus, ovarian calcifications, rectal pressure Plan: Robotic assisted TLH with possible BSO  ~15 minutes spent with patient >50% of time was in face to face discussion of above.

## 2013-04-02 ENCOUNTER — Encounter: Payer: Self-pay | Admitting: Obstetrics & Gynecology

## 2013-04-02 NOTE — Patient Instructions (Signed)
Kennon Rounds will call you with further information regarding surgery.

## 2013-04-03 LAB — FOLLICLE STIMULATING HORMONE: FSH: 10.1 m[IU]/mL

## 2013-04-04 ENCOUNTER — Telehealth: Payer: Self-pay

## 2013-04-04 NOTE — Telephone Encounter (Signed)
Patient notified

## 2013-04-04 NOTE — Telephone Encounter (Signed)
6/24 [email or web address]//[email or web address] 

## 2013-04-04 NOTE — Telephone Encounter (Signed)
Message copied by Elisha Headland on Tue Apr 04, 2013 11:10 AM ------      Message from: Jerene Bears      Created: Tue Apr 04, 2013 10:41 AM       Please inform this is not showing menopause.  If cycle is heavy when starts, she needs to call and I will start progesterone to make it lessen. ------

## 2013-04-05 ENCOUNTER — Telehealth: Payer: Self-pay | Admitting: Obstetrics & Gynecology

## 2013-04-05 NOTE — Telephone Encounter (Signed)
LMTCB to discuss insurance benefits for surgery and to proceed with scheduling.

## 2013-04-26 ENCOUNTER — Telehealth: Payer: Self-pay | Admitting: Obstetrics & Gynecology

## 2013-04-26 NOTE — Telephone Encounter (Signed)
LMTCB to discuss insurance benefits and set payment date for surgery prepayment.

## 2013-04-28 ENCOUNTER — Telehealth: Payer: Self-pay | Admitting: *Deleted

## 2013-04-28 NOTE — Telephone Encounter (Signed)
LMTCB to confirm date for surgery.

## 2013-05-02 NOTE — Telephone Encounter (Signed)
Call to patient regarding surgery date of 07-03-13. LMTCB.

## 2013-05-12 NOTE — Telephone Encounter (Signed)
Patient notified of surgery date of 07-03-13 based on her request of mid Sept to October date.  Patient states her partner has broken femur and that date was dependent on partner's recovery.  She will need to see if this date will work or if will need to delay till Oct.  Advised that we can leave it scheduled for now and she will call back end of next week with update.

## 2013-05-12 NOTE — Telephone Encounter (Signed)
Pt returning your call

## 2013-05-17 ENCOUNTER — Ambulatory Visit (INDEPENDENT_AMBULATORY_CARE_PROVIDER_SITE_OTHER): Payer: BC Managed Care – PPO | Admitting: Family Medicine

## 2013-05-17 ENCOUNTER — Encounter: Payer: Self-pay | Admitting: Family Medicine

## 2013-05-17 VITALS — BP 108/80 | Temp 98.3°F | Ht 63.0 in | Wt 186.0 lb

## 2013-05-17 DIAGNOSIS — I10 Essential (primary) hypertension: Secondary | ICD-10-CM

## 2013-05-17 DIAGNOSIS — R7303 Prediabetes: Secondary | ICD-10-CM

## 2013-05-17 DIAGNOSIS — N390 Urinary tract infection, site not specified: Secondary | ICD-10-CM

## 2013-05-17 DIAGNOSIS — Z79899 Other long term (current) drug therapy: Secondary | ICD-10-CM

## 2013-05-17 DIAGNOSIS — R7309 Other abnormal glucose: Secondary | ICD-10-CM

## 2013-05-17 DIAGNOSIS — E785 Hyperlipidemia, unspecified: Secondary | ICD-10-CM

## 2013-05-17 LAB — POCT URINALYSIS DIPSTICK: Glucose, UA: 50

## 2013-05-17 MED ORDER — FLUOXETINE HCL 10 MG PO TABS: 10.0000 mg | ORAL_TABLET | Freq: Every day | ORAL | Status: DC

## 2013-05-17 MED ORDER — SULFAMETHOXAZOLE-TRIMETHOPRIM 800-160 MG PO TABS: 1.0000 | ORAL_TABLET | Freq: Two times a day (BID) | ORAL | Status: DC

## 2013-05-17 MED ORDER — TRIAMCINOLONE ACETONIDE(NASAL) 55 MCG/ACT NA INHA: 2.0000 | Freq: Every day | NASAL | Status: DC

## 2013-05-17 MED ORDER — HYDROCODONE-ACETAMINOPHEN 5-325 MG PO TABS: 1.0000 | ORAL_TABLET | Freq: Four times a day (QID) | ORAL | Status: DC | PRN

## 2013-05-17 NOTE — Progress Notes (Signed)
  Subjective:    Patient ID: Lisa Li, female    DOB: [birth year], 47 y.o.   MRN: [id number]  Urinary Tract Infection  This is a new problem. The current episode started 1 to 4 weeks ago. The problem occurs intermittently. The problem has been unchanged. The pain is at a severity of 8/10 (abdominal pain). The pain is mild. There has been no fever. Associated symptoms comments: Abdominal pain. She has tried home medications for the symptoms. The treatment provided mild relief.  Also needs refills on medications She also has complex findings on uterine ultrasound showing complex ovarian problems as well as fibroids her gynecologist recommended that she have a robotic-assisted hysterectomy she is considering this. She also needs to do her lab work him followup for regular checkup but she does need refills on her medicines.   Review of Systems See above.    Objective:   Physical Exam Lungs are clear hearts regular abdomen soft flanks nontender UA with wbc's       Assessment & Plan:  UTI-Cefzil 7 days as directed plus also urine culture. Complex uterine findings-I. believe it is in the patient's best interest to get a hysterectomy she will consider it further and followup with gynecology Patient will get her lab work and followup with [place] regarding her chronic health problems refills on her medicines were given. She does use hydrocodone for intermittent pain but she does not abuse it.

## 2013-05-18 ENCOUNTER — Encounter: Payer: Self-pay | Admitting: *Deleted

## 2013-05-19 LAB — URINE CULTURE: Colony Count: NO GROWTH

## 2013-06-05 ENCOUNTER — Other Ambulatory Visit: Payer: Self-pay

## 2013-06-08 ENCOUNTER — Telehealth: Payer: Self-pay | Admitting: *Deleted

## 2013-06-08 NOTE — Telephone Encounter (Signed)
Call to patient to follow up on decesion for surgery that is currently scheduled for 07-03-13.  LMTCB.

## 2013-06-09 ENCOUNTER — Other Ambulatory Visit: Payer: Self-pay

## 2013-06-09 DIAGNOSIS — Z1231 Encounter for screening mammogram for malignant neoplasm of breast: Secondary | ICD-10-CM

## 2013-06-09 LAB — BASIC METABOLIC PANEL
BUN: 14 mg/dL (ref 6–23)
Calcium: 9.1 mg/dL (ref 8.4–10.5)
Potassium: 4.1 mEq/L (ref 3.5–5.3)
Sodium: 136 mEq/L (ref 135–145)

## 2013-06-09 LAB — HEPATIC FUNCTION PANEL
ALT: 25 U/L (ref 0–35)
AST: 22 U/L (ref 0–37)
Indirect Bilirubin: 0.4 mg/dL (ref 0.0–0.9)
Total Protein: 6.8 g/dL (ref 6.0–8.3)

## 2013-06-09 LAB — LIPID PANEL
Cholesterol: 185 mg/dL (ref 0–200)
Triglycerides: 160 mg/dL — ABNORMAL HIGH (ref <150)
VLDL: 32 mg/dL (ref 0–40)

## 2013-06-09 LAB — HEMOGLOBIN A1C: Mean Plasma Glucose: 131 mg/dL — ABNORMAL HIGH (ref <117)

## 2013-06-13 ENCOUNTER — Encounter (HOSPITAL_COMMUNITY): Payer: Self-pay | Admitting: Pharmacist

## 2013-06-13 ENCOUNTER — Encounter: Payer: Self-pay | Admitting: Family Medicine

## 2013-06-19 ENCOUNTER — Telehealth: Payer: Self-pay | Admitting: *Deleted

## 2013-06-19 NOTE — Telephone Encounter (Signed)
Patient calling and confirms surgery on 07-03-13 will work for her.  Robotic TLH with poss BSO scheduled for 07-03-13 at 0730 at [facility]/  Surgery instructions reviewed and pre/post op appts scheduled.  Will mail instruction sheet.

## 2013-06-26 ENCOUNTER — Encounter (HOSPITAL_COMMUNITY): Payer: Self-pay

## 2013-06-26 ENCOUNTER — Encounter (HOSPITAL_COMMUNITY)
Admission: RE | Admit: 2013-06-26 | Discharge: 2013-06-26 | Disposition: A | Discharge status: Home / Self Care | Payer: BC Managed Care – PPO | Source: Ambulatory Visit | Attending: Obstetrics & Gynecology | Admitting: Obstetrics & Gynecology

## 2013-06-26 ENCOUNTER — Ambulatory Visit (INDEPENDENT_AMBULATORY_CARE_PROVIDER_SITE_OTHER): Payer: BC Managed Care – PPO | Admitting: Obstetrics & Gynecology

## 2013-06-26 VITALS — BP 138/88 | HR 60 | Resp 16 | Ht 63.75 in | Wt 183.4 lb

## 2013-06-26 DIAGNOSIS — Z01812 Encounter for preprocedural laboratory examination: Secondary | ICD-10-CM | POA: Insufficient documentation

## 2013-06-26 DIAGNOSIS — D219 Benign neoplasm of connective and other soft tissue, unspecified: Secondary | ICD-10-CM

## 2013-06-26 DIAGNOSIS — N9489 Other specified conditions associated with female genital organs and menstrual cycle: Secondary | ICD-10-CM

## 2013-06-26 DIAGNOSIS — R102 Pelvic and perineal pain: Secondary | ICD-10-CM

## 2013-06-26 DIAGNOSIS — D259 Leiomyoma of uterus, unspecified: Secondary | ICD-10-CM

## 2013-06-26 DIAGNOSIS — Z01818 Encounter for other preprocedural examination: Secondary | ICD-10-CM | POA: Insufficient documentation

## 2013-06-26 HISTORY — DX: Urinary tract infection, site not specified: N39.0

## 2013-06-26 HISTORY — DX: Prediabetes: R73.03

## 2013-06-26 LAB — CBC
MCH: 26.2 pg (ref 26.0–34.0)
MCHC: 32.5 g/dL (ref 30.0–36.0)
Platelets: 214 10*3/uL (ref 150–400)
RDW: 13.7 % (ref 11.5–15.5)

## 2013-06-26 MED ORDER — MUPIROCIN 2 % EX OINT: TOPICAL_OINTMENT | Freq: Every day | CUTANEOUS | Status: DC

## 2013-06-26 NOTE — Progress Notes (Signed)
47 y.o. G0P0 SingleCaucasian female here for discussion of upcoming procedure.  Robotic TLH/BSO planned due to enlarging uterine fibroid, pelvic pressure, pelvic pain, and heavier cycles.  As well, pt had ultrasound 02/14/13 with  4.3 x 3.5cm posterior fibroid (uterus 8.9 x 4.5 x 4.5cm) and 3cm left septate cystic area with echogenic foci.  Ca-25 was normal.  Patient has h/o menstrual related migraines that has required narcotic treatment.  She has been on multiple regimens for headache treatment.  She had thought seriously about options and would like ovaries removed.  She has been clearly counseled about increased risk of cardiovascular disease with women with ovary removal before 65.  She really feels that ovary removal will help her headaches and I am planning on starting her on HRT post-operatively.   Procedure discussed with patient.  Recovery and pain management discussed.  Risks discussed including but not limited to bleeding, rare risk of transfusion, infection, 1% risk of uterine perforation with risks of fluid deficit causing cardiac arrythmia, cerebral swelling and/or need to stop procedure early.  Fluid emboli and rare risk of death discussed.  DVT/PE, rare risk of risk of bowel/bladder/ureteral/vascular injury.  Patient aware if pathology abnormal she may need additional treatment.  All questions answered.    Considerations for this surgery:  Patient would like pictures of specimen taken.  Patient would like estradiol level drawn pre-op.  Advised I will not treat her hormonal symptoms to a level but she still desires this to be done.  Ob Hx:   Patient's last menstrual period was 05/31/2013.          Sexually active: yes Birth control: not needed due to sexual preference Last pap: 1/12.  Normal.  No hx of abnormal. Tobacco: no  Past Medical History  Diagnosis Date  . Migraine headache   . Myalgia     Chronic  . Frequent UTI   . Pre-diabetes     Past Surgical History  Procedure  Laterality Date  . Appendectomy    . Cholecystectomy    . Tonsillectomy  1973  . Colonoscopy w/ endoscopic [place]  09/27/06    02/2011   . Esophagogastroduodenoscopy      Allergies: Triptans  Current Outpatient Prescriptions  Medication Sig Dispense Refill  . AMITIZA 24 MCG capsule Take 24 mcg by mouth daily with breakfast.       . diazepam (VALIUM) 5 MG tablet TAKE ONE TABLET BY MOUTH AT BEDTIME AS NEEDED.  30 tablet  1  . docusate sodium (COLACE) 100 MG capsule Take 100 mg by mouth 2 (two) times daily.      Marland Kitchen ERRIN 0.35 MG tablet Take 1 tablet by mouth daily.       Marland Kitchen FLUoxetine (PROZAC) 10 MG tablet Take 10 mg by mouth daily. Pt. Sometimes takes 5mg .      . HYDROcodone-acetaminophen (NORCO/VICODIN) 5-325 MG per tablet Take 1 tablet by mouth every 6 (six) hours as needed for pain.  40 tablet  1  . lansoprazole (PREVACID) 15 MG capsule Take 15-30 mg by mouth daily.      Marland Kitchen loratadine (CLARITIN) 10 MG tablet Take 10 mg by mouth daily.      Marland Kitchen triamcinolone (NASACORT) 55 MCG/ACT nasal inhaler Place 2 sprays into the nose daily.  1 Inhaler  12  . lidocaine (XYLOCAINE) 5 % ointment Apply 1 application topically daily as needed.       . naproxen (NAPROSYN) 500 MG tablet TAKE 1 TABLET BY MOUTH TWICE DAILY AS  NEEDED FOR HEADACHE. (STOP MOBIC WHEN USING NAPROXEN.)  60 tablet  1  . propranolol (INDERAL) 10 MG tablet Take 20 mg by mouth 2 (two) times daily as needed.       No current facility-administered medications for this visit.    ROS: A comprehensive review of systems was negative.  Exam:    BP 138/88  Pulse 60  Resp 16  Ht 5' 3.75" (1.619 m)  Wt 183 lb 6.4 oz (83.19 kg)  BMI 31.74 kg/m2  LMP 05/31/2013  General appearance: alert and cooperative Head: Normocephalic, without obvious abnormality, atraumatic Neck: no adenopathy, supple, symmetrical, trachea midline and thyroid not enlarged, symmetric, no tenderness/mass/nodules Lungs: clear to auscultation bilaterally Heart: regular  rate and rhythm, S1, S2 normal, no murmur, click, rub or gallop Abdomen: soft, non-tender; bowel sounds normal; no masses,  no organomegaly Extremities: extremities normal, atraumatic, no cyanosis or edema Skin: Skin color, texture, turgor normal. No rashes or lesions Lymph nodes: Cervical, supraclavicular, and axillary nodes normal. no inguinal nodes palpated Neurologic: Alert and oriented X 3, normal strength and tone. Normal symmetric reflexes. Normal coordination and gait  Pelvic: External genitalia:  no lesions              Urethra: normal appearing urethra with no masses, tenderness or lesions              Bartholins and Skenes: normal                 Vagina: normal appearing vagina with normal color and discharge, no lesions              Cervix: normal appearance              Pap taken: no        Bimanual Exam:  Uterus:  uterus is normal size, shape, consistency and nontender, enlarged to 8 week's size                                      Adnexa:    normal adnexa in size, nontender and no masses                                      Rectovaginal: Deferred                                      Anus:  normal sphincter tone, no lesions  A: Enlarging uterine fibroid, symptomatic Migraine headaches HbA1C 6.2 8/29 with PCP     P: Robotic TLH/BSO planned Rx for Naproxin and Percocet given. 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  Requested MRSA testing pre-operatively but was informed not needed.  Rx given. Will check estradiol pre-operatively.  Lengthy visit with pt and partner.  ~45 minutes spent with patient >50% of time was in face to face discussion of above.

## 2013-06-26 NOTE — Patient Instructions (Signed)
Your procedure is scheduled on:07/03/13  Enter through the Main Entrance at :6am Pick up desk phone and dial [phone] and inform us of your arrival.  Please call [phone] if you have any problems the morning of surgery.  Remember: Do not eat food or drink liquids, including water, after midnight: Sunday  You may brush your teeth the morning of surgery.  Take these meds the morning of surgery with a sip of water: Prevacid  DO NOT wear jewelry, eye make-up, lipstick,body lotion, or dark fingernail polish.  (Polished toes are ok) You may wear deodorant.  If you are to be admitted after surgery, leave suitcase in car until your room has been assigned. Patients discharged on the day of surgery will not be allowed to drive home. Wear loose fitting, comfortable clothes for your ride home.

## 2013-06-29 ENCOUNTER — Ambulatory Visit
Admission: RE | Admit: 2013-06-29 | Discharge: 2013-06-29 | Disposition: A | Discharge status: Home / Self Care | Payer: BC Managed Care – PPO | Source: Ambulatory Visit

## 2013-06-29 DIAGNOSIS — Z1231 Encounter for screening mammogram for malignant neoplasm of breast: Secondary | ICD-10-CM

## 2013-07-02 ENCOUNTER — Encounter: Payer: Self-pay | Admitting: Obstetrics & Gynecology

## 2013-07-02 MED ORDER — DEXTROSE 5 % IV SOLN
2.0000 g | INTRAVENOUS | Status: AC
  Administered 2013-07-03: 2 g via INTRAVENOUS
  Filled 2013-07-02: qty 2

## 2013-07-02 NOTE — Patient Instructions (Signed)
Review brochure regarding surgery.

## 2013-07-03 ENCOUNTER — Encounter (HOSPITAL_COMMUNITY)
Admission: RE | Disposition: A | Discharge status: Home / Self Care | Payer: Self-pay | Source: Ambulatory Visit | Attending: Obstetrics & Gynecology

## 2013-07-03 ENCOUNTER — Encounter (HOSPITAL_COMMUNITY): Payer: Self-pay | Admitting: Anesthesiology

## 2013-07-03 ENCOUNTER — Ambulatory Visit (HOSPITAL_COMMUNITY): Payer: BC Managed Care – PPO | Admitting: Anesthesiology

## 2013-07-03 ENCOUNTER — Encounter (HOSPITAL_COMMUNITY): Payer: Self-pay

## 2013-07-03 ENCOUNTER — Ambulatory Visit (HOSPITAL_COMMUNITY)
Admission: RE | Admit: 2013-07-03 | Discharge: 2013-07-04 | Disposition: A | Discharge status: Home / Self Care | Payer: BC Managed Care – PPO | Source: Ambulatory Visit | Attending: Obstetrics & Gynecology | Admitting: Obstetrics & Gynecology

## 2013-07-03 DIAGNOSIS — N8000 Endometriosis of the uterus, unspecified: Secondary | ICD-10-CM | POA: Insufficient documentation

## 2013-07-03 DIAGNOSIS — D259 Leiomyoma of uterus, unspecified: Secondary | ICD-10-CM

## 2013-07-03 DIAGNOSIS — N949 Unspecified condition associated with female genital organs and menstrual cycle: Secondary | ICD-10-CM | POA: Insufficient documentation

## 2013-07-03 DIAGNOSIS — D251 Intramural leiomyoma of uterus: Secondary | ICD-10-CM | POA: Insufficient documentation

## 2013-07-03 DIAGNOSIS — N8300 Follicular cyst of ovary, unspecified side: Secondary | ICD-10-CM | POA: Insufficient documentation

## 2013-07-03 DIAGNOSIS — N72 Inflammatory disease of cervix uteri: Secondary | ICD-10-CM | POA: Insufficient documentation

## 2013-07-03 HISTORY — PX: ROBOTIC ASSISTED TOTAL HYSTERECTOMY WITH BILATERAL SALPINGO OOPHERECTOMY: SHX6086

## 2013-07-03 HISTORY — PX: CYSTOSCOPY: SHX5120

## 2013-07-03 LAB — CBC
HCT: 39.4 % (ref 36.0–46.0)
Hemoglobin: 12.8 g/dL (ref 12.0–15.0)
MCH: 26.5 pg (ref 26.0–34.0)
MCHC: 32.5 g/dL (ref 30.0–36.0)
MCV: 81.6 fL (ref 78.0–100.0)
Platelets: 219 10*3/uL (ref 150–400)
RBC: 4.83 MIL/uL (ref 3.87–5.11)

## 2013-07-03 LAB — PREGNANCY, URINE: Preg Test, Ur: NEGATIVE

## 2013-07-03 SURGERY — ROBOTIC ASSISTED TOTAL HYSTERECTOMY WITH BILATERAL SALPINGO OOPHORECTOMY
Anesthesia: General | Site: Urethra | Wound class: Clean Contaminated

## 2013-07-03 MED ORDER — ONDANSETRON HCL 4 MG/2ML IJ SOLN
INTRAMUSCULAR | Status: DC | PRN
  Administered 2013-07-03: 4 mg via INTRAVENOUS

## 2013-07-03 MED ORDER — EPHEDRINE SULFATE 50 MG/ML IJ SOLN
INTRAMUSCULAR | Status: DC | PRN
  Administered 2013-07-03: 5 mg via INTRAVENOUS
  Administered 2013-07-03 (×2): 10 mg via INTRAVENOUS

## 2013-07-03 MED ORDER — PROPOFOL 10 MG/ML IV BOLUS
INTRAVENOUS | Status: DC | PRN
  Administered 2013-07-03: 180 mg via INTRAVENOUS

## 2013-07-03 MED ORDER — ROPIVACAINE HCL 5 MG/ML IJ SOLN
INTRAMUSCULAR | Status: AC
  Filled 2013-07-03: qty 60

## 2013-07-03 MED ORDER — NAPROXEN 500 MG PO TABS: 500.0000 mg | ORAL_TABLET | Freq: Two times a day (BID) | ORAL | Status: DC

## 2013-07-03 MED ORDER — DIAZEPAM 5 MG PO TABS
2.5000 mg | ORAL_TABLET | Freq: Four times a day (QID) | ORAL | Status: DC | PRN
  Administered 2013-07-03: 2.5 mg via ORAL
  Filled 2013-07-03: qty 1

## 2013-07-03 MED ORDER — DEXTROSE-NACL 5-0.45 % IV SOLN: INTRAVENOUS | Status: DC

## 2013-07-03 MED ORDER — MIDAZOLAM HCL 2 MG/2ML IJ SOLN
INTRAMUSCULAR | Status: AC
  Filled 2013-07-03: qty 2

## 2013-07-03 MED ORDER — INDIGOTINDISULFONATE SODIUM 8 MG/ML IJ SOLN
INTRAMUSCULAR | Status: AC
  Filled 2013-07-03: qty 5

## 2013-07-03 MED ORDER — HYDROMORPHONE HCL PF 1 MG/ML IJ SOLN
INTRAMUSCULAR | Status: AC
  Administered 2013-07-03: 0.5 mg via INTRAVENOUS
  Filled 2013-07-03: qty 1

## 2013-07-03 MED ORDER — LIDOCAINE 5 % EX OINT
TOPICAL_OINTMENT | Freq: Four times a day (QID) | CUTANEOUS | Status: DC | PRN
  Administered 2013-07-03: 19:00:00 via TOPICAL
  Filled 2013-07-03: qty 35.44

## 2013-07-03 MED ORDER — ARTIFICIAL TEARS OP OINT
TOPICAL_OINTMENT | OPHTHALMIC | Status: AC
  Filled 2013-07-03: qty 3.5

## 2013-07-03 MED ORDER — LIDOCAINE HCL (CARDIAC) 20 MG/ML IV SOLN
INTRAVENOUS | Status: AC
  Filled 2013-07-03: qty 5

## 2013-07-03 MED ORDER — STERILE WATER FOR IRRIGATION IR SOLN
Status: DC | PRN
  Administered 2013-07-03: 1000 mL

## 2013-07-03 MED ORDER — LUBIPROSTONE 24 MCG PO CAPS
24.0000 ug | ORAL_CAPSULE | Freq: Every day | ORAL | Status: DC
  Filled 2013-07-03: qty 1

## 2013-07-03 MED ORDER — GLYCOPYRROLATE 0.2 MG/ML IJ SOLN
INTRAMUSCULAR | Status: AC
  Filled 2013-07-03: qty 1

## 2013-07-03 MED ORDER — PANTOPRAZOLE SODIUM 40 MG PO TBEC
40.0000 mg | DELAYED_RELEASE_TABLET | Freq: Every day | ORAL | Status: DC
  Administered 2013-07-03 – 2013-07-04 (×2): 40 mg via ORAL
  Filled 2013-07-03 (×3): qty 1

## 2013-07-03 MED ORDER — KETOROLAC TROMETHAMINE 30 MG/ML IJ SOLN
INTRAMUSCULAR | Status: AC
  Filled 2013-07-03: qty 1

## 2013-07-03 MED ORDER — LUBIPROSTONE 24 MCG PO CAPS
24.0000 ug | ORAL_CAPSULE | Freq: Every day | ORAL | Status: DC
  Administered 2013-07-03: 24 ug via ORAL
  Filled 2013-07-03 (×2): qty 1

## 2013-07-03 MED ORDER — LIDOCAINE HCL (CARDIAC) 20 MG/ML IV SOLN
INTRAVENOUS | Status: DC | PRN
  Administered 2013-07-03: 50 mg via INTRAVENOUS

## 2013-07-03 MED ORDER — DEXAMETHASONE SODIUM PHOSPHATE 10 MG/ML IJ SOLN
INTRAMUSCULAR | Status: DC | PRN
  Administered 2013-07-03: 10 mg via INTRAVENOUS

## 2013-07-03 MED ORDER — FENTANYL CITRATE 0.05 MG/ML IJ SOLN
INTRAMUSCULAR | Status: AC
  Filled 2013-07-03: qty 5

## 2013-07-03 MED ORDER — NEOSTIGMINE METHYLSULFATE 1 MG/ML IJ SOLN
INTRAMUSCULAR | Status: DC | PRN
  Administered 2013-07-03: 1 mg via INTRAVENOUS

## 2013-07-03 MED ORDER — PROMETHAZINE HCL 25 MG/ML IJ SOLN
INTRAMUSCULAR | Status: AC
  Administered 2013-07-03: 6.25 mg via INTRAVENOUS
  Filled 2013-07-03: qty 1

## 2013-07-03 MED ORDER — PROMETHAZINE HCL 25 MG/ML IJ SOLN
6.2500 mg | Freq: Once | INTRAMUSCULAR | Status: AC
  Administered 2013-07-03: 6.25 mg via INTRAVENOUS

## 2013-07-03 MED ORDER — FENTANYL CITRATE 0.05 MG/ML IJ SOLN
INTRAMUSCULAR | Status: DC | PRN
  Administered 2013-07-03: 100 ug via INTRAVENOUS
  Administered 2013-07-03: 25 ug via INTRAVENOUS
  Administered 2013-07-03: 100 ug via INTRAVENOUS
  Administered 2013-07-03: 25 ug via INTRAVENOUS

## 2013-07-03 MED ORDER — ALUM & MAG HYDROXIDE-SIMETH 200-200-20 MG/5ML PO SUSP: 30.0000 mL | ORAL | Status: DC | PRN

## 2013-07-03 MED ORDER — OXYCODONE-ACETAMINOPHEN 5-325 MG PO TABS
1.0000 | ORAL_TABLET | ORAL | Status: DC | PRN
  Administered 2013-07-03 – 2013-07-04 (×4): 2 via ORAL
  Filled 2013-07-03: qty 1
  Filled 2013-07-03 (×4): qty 2

## 2013-07-03 MED ORDER — DIAZEPAM 2 MG PO TABS: 2.0000 mg | ORAL_TABLET | Freq: Four times a day (QID) | ORAL | Status: DC | PRN

## 2013-07-03 MED ORDER — GLYCOPYRROLATE 0.2 MG/ML IJ SOLN
INTRAMUSCULAR | Status: DC | PRN
  Administered 2013-07-03: 0.2 mg via INTRAVENOUS

## 2013-07-03 MED ORDER — HYDROMORPHONE HCL PF 1 MG/ML IJ SOLN
0.2500 mg | INTRAMUSCULAR | Status: DC | PRN
  Administered 2013-07-03 (×3): 0.5 mg via INTRAVENOUS

## 2013-07-03 MED ORDER — KETOROLAC TROMETHAMINE 30 MG/ML IJ SOLN
30.0000 mg | Freq: Four times a day (QID) | INTRAMUSCULAR | Status: DC
  Administered 2013-07-03: 30 mg via INTRAVENOUS
  Filled 2013-07-03: qty 1

## 2013-07-03 MED ORDER — IBUPROFEN 800 MG PO TABS
800.0000 mg | ORAL_TABLET | Freq: Three times a day (TID) | ORAL | Status: DC
  Administered 2013-07-03 – 2013-07-04 (×2): 800 mg via ORAL
  Filled 2013-07-03 (×2): qty 1

## 2013-07-03 MED ORDER — ACETAMINOPHEN 325 MG PO TABS: 650.0000 mg | ORAL_TABLET | ORAL | Status: DC | PRN

## 2013-07-03 MED ORDER — ROPIVACAINE HCL 5 MG/ML IJ SOLN
INTRAMUSCULAR | Status: DC | PRN
  Administered 2013-07-03: 70 mL

## 2013-07-03 MED ORDER — LACTATED RINGERS IV SOLN
INTRAVENOUS | Status: DC
  Administered 2013-07-03 (×3): via INTRAVENOUS

## 2013-07-03 MED ORDER — KETOROLAC TROMETHAMINE 30 MG/ML IJ SOLN
INTRAMUSCULAR | Status: DC | PRN
  Administered 2013-07-03: 30 mg via INTRAVENOUS

## 2013-07-03 MED ORDER — PROPRANOLOL HCL 20 MG PO TABS
20.0000 mg | ORAL_TABLET | Freq: Two times a day (BID) | ORAL | Status: DC
  Administered 2013-07-04: 20 mg via ORAL
  Filled 2013-07-03 (×4): qty 1

## 2013-07-03 MED ORDER — ONDANSETRON HCL 4 MG/2ML IJ SOLN
INTRAMUSCULAR | Status: AC
  Filled 2013-07-03: qty 2

## 2013-07-03 MED ORDER — HYDROMORPHONE HCL PF 1 MG/ML IJ SOLN
1.0000 mg | INTRAMUSCULAR | Status: DC | PRN
  Administered 2013-07-03: 1 mg via INTRAVENOUS
  Filled 2013-07-03: qty 1

## 2013-07-03 MED ORDER — PROPOFOL 10 MG/ML IV EMUL
INTRAVENOUS | Status: AC
  Filled 2013-07-03: qty 20

## 2013-07-03 MED ORDER — LACTATED RINGERS IR SOLN
Status: DC | PRN
  Administered 2013-07-03: 3000 mL

## 2013-07-03 MED ORDER — PROMETHAZINE HCL 25 MG PO TABS: 12.5000 mg | ORAL_TABLET | ORAL | Status: DC | PRN

## 2013-07-03 MED ORDER — TEMAZEPAM 15 MG PO CAPS: 15.0000 mg | ORAL_CAPSULE | Freq: Every evening | ORAL | Status: DC | PRN

## 2013-07-03 MED ORDER — MENTHOL 3 MG MT LOZG: 1.0000 | LOZENGE | OROMUCOSAL | Status: DC | PRN

## 2013-07-03 MED ORDER — PANTOPRAZOLE SODIUM 40 MG IV SOLR
40.0000 mg | Freq: Every day | INTRAVENOUS | Status: DC
  Filled 2013-07-03: qty 40

## 2013-07-03 MED ORDER — KETOROLAC TROMETHAMINE 30 MG/ML IJ SOLN: 30.0000 mg | Freq: Four times a day (QID) | INTRAMUSCULAR | Status: DC

## 2013-07-03 MED ORDER — SIMETHICONE 80 MG PO CHEW: 80.0000 mg | CHEWABLE_TABLET | Freq: Four times a day (QID) | ORAL | Status: DC | PRN

## 2013-07-03 MED ORDER — NEOSTIGMINE METHYLSULFATE 1 MG/ML IJ SOLN
INTRAMUSCULAR | Status: AC
  Filled 2013-07-03: qty 1

## 2013-07-03 MED ORDER — INDIGOTINDISULFONATE SODIUM 8 MG/ML IJ SOLN
INTRAMUSCULAR | Status: DC | PRN
  Administered 2013-07-03: 5 mL via INTRAVENOUS

## 2013-07-03 MED ORDER — DEXAMETHASONE SODIUM PHOSPHATE 10 MG/ML IJ SOLN
INTRAMUSCULAR | Status: AC
  Filled 2013-07-03: qty 1

## 2013-07-03 MED ORDER — ROCURONIUM BROMIDE 100 MG/10ML IV SOLN
INTRAVENOUS | Status: DC | PRN
  Administered 2013-07-03: 40 mg via INTRAVENOUS
  Administered 2013-07-03: 10 mg via INTRAVENOUS
  Administered 2013-07-03: 20 mg via INTRAVENOUS

## 2013-07-03 MED ORDER — MIDAZOLAM HCL 2 MG/2ML IJ SOLN
INTRAMUSCULAR | Status: DC | PRN
  Administered 2013-07-03: 2 mg via INTRAVENOUS

## 2013-07-03 SURGICAL SUPPLY — 62 items
ADH SKN CLS APL DERMABOND .7 (GAUZE/BANDAGES/DRESSINGS) ×2
APL SKNCLS STERI-STRIP NONHPOA (GAUZE/BANDAGES/DRESSINGS) ×2
BAG URINE DRAINAGE (UROLOGICAL SUPPLIES) ×3 IMPLANT
BARRIER ADHS 3X4 INTERCEED (GAUZE/BANDAGES/DRESSINGS) ×3 IMPLANT
BENZOIN TINCTURE PRP APPL 2/3 (GAUZE/BANDAGES/DRESSINGS) ×3 IMPLANT
BRR ADH 4X3 ABS CNTRL BYND (GAUZE/BANDAGES/DRESSINGS) ×2
CHLORAPREP W/TINT 26ML (MISCELLANEOUS) ×3 IMPLANT
CLOTH BEACON ORANGE TIMEOUT ST (SAFETY) ×3 IMPLANT
CONT PATH 16OZ SNAP LID 3702 (MISCELLANEOUS) ×3 IMPLANT
COVER MAYO STAND STRL (DRAPES) ×3 IMPLANT
COVER TABLE BACK 60X90 (DRAPES) ×6 IMPLANT
COVER TIP SHEARS 8 DVNC (MISCELLANEOUS) ×2 IMPLANT
COVER TIP SHEARS 8MM DA VINCI (MISCELLANEOUS) ×1
DECANTER SPIKE VIAL GLASS SM (MISCELLANEOUS) ×3 IMPLANT
DERMABOND ADVANCED (GAUZE/BANDAGES/DRESSINGS) ×1
DERMABOND ADVANCED .7 DNX12 (GAUZE/BANDAGES/DRESSINGS) ×2 IMPLANT
DRAPE HUG U DISPOSABLE (DRAPE) ×3 IMPLANT
DRAPE LG THREE QUARTER DISP (DRAPES) ×6 IMPLANT
DRAPE WARM FLUID 44X44 (DRAPE) ×3 IMPLANT
ELECT REM PT RETURN 9FT ADLT (ELECTROSURGICAL) ×3
ELECTRODE REM PT RTRN 9FT ADLT (ELECTROSURGICAL) ×2 IMPLANT
EVACUATOR SMOKE 8.L (FILTER) ×3 IMPLANT
GAUZE VASELINE 3X9 (GAUZE/BANDAGES/DRESSINGS) IMPLANT
GLOVE BIOGEL PI IND STRL 7.0 (GLOVE) ×4 IMPLANT
GLOVE BIOGEL PI INDICATOR 7.0 (GLOVE) ×2
GLOVE ECLIPSE 6.5 STRL STRAW (GLOVE) ×12 IMPLANT
GOWN STRL REIN XL XLG (GOWN DISPOSABLE) ×18 IMPLANT
KIT ACCESSORY DA VINCI DISP (KITS) ×1
KIT ACCESSORY DVNC DISP (KITS) ×2 IMPLANT
LEGGING LITHOTOMY PAIR STRL (DRAPES) ×3 IMPLANT
NEEDLE INSUFFLATION 120MM (ENDOMECHANICALS) ×3 IMPLANT
OCCLUDER COLPOPNEUMO (BALLOONS) ×1 IMPLANT
PACK LAVH (CUSTOM PROCEDURE TRAY) ×3 IMPLANT
PAD PREP 24X48 CUFFED NSTRL (MISCELLANEOUS) ×6 IMPLANT
PLUG CATH AND CAP STER (CATHETERS) ×3 IMPLANT
PROTECTOR NERVE ULNAR (MISCELLANEOUS) ×6 IMPLANT
SCISSORS LAP 5X35 DISP (ENDOMECHANICALS) ×1 IMPLANT
SET CYSTO W/LG BORE CLAMP LF (SET/KITS/TRAYS/PACK) ×3 IMPLANT
SET IRRIG TUBING LAPAROSCOPIC (IRRIGATION / IRRIGATOR) ×3 IMPLANT
SOLUTION ELECTROLUBE (MISCELLANEOUS) ×3 IMPLANT
STRIP CLOSURE SKIN 1/4X4 (GAUZE/BANDAGES/DRESSINGS) ×3 IMPLANT
SUT VIC AB 0 CT1 27 (SUTURE) ×15
SUT VIC AB 0 CT1 27XBRD ANBCTR (SUTURE) ×10 IMPLANT
SUT VICRYL 0 UR6 27IN ABS (SUTURE) ×3 IMPLANT
SUT VICRYL RAPIDE 4/0 PS 2 (SUTURE) ×6 IMPLANT
SUT VLOC 180 2-0 9IN GS21 (SUTURE) ×1 IMPLANT
SYR 50ML LL SCALE MARK (SYRINGE) ×3 IMPLANT
SYSTEM CONVERTIBLE TROCAR (TROCAR) IMPLANT
TIP RUMI ORANGE 6.7MMX12CM (TIP) IMPLANT
TIP UTERINE 5.1X6CM LAV DISP (MISCELLANEOUS) IMPLANT
TIP UTERINE 6.7X10CM GRN DISP (MISCELLANEOUS) IMPLANT
TIP UTERINE 6.7X6CM WHT DISP (MISCELLANEOUS) IMPLANT
TIP UTERINE 6.7X8CM BLUE DISP (MISCELLANEOUS) ×1 IMPLANT
TOWEL OR 17X24 6PK STRL BLUE (TOWEL DISPOSABLE) ×6 IMPLANT
TRAY FOLEY CATH 14FR (SET/KITS/TRAYS/PACK) ×3 IMPLANT
TROCAR DILATING TIP 12MM 150MM (ENDOMECHANICALS) ×3 IMPLANT
TROCAR DISP BLADELESS 8 DVNC (TROCAR) ×2 IMPLANT
TROCAR DISP BLADELESS 8MM (TROCAR) ×1
TROCAR XCEL NON-BLD 5MMX100MML (ENDOMECHANICALS) ×3 IMPLANT
TUBING FILTER THERMOFLATOR (ELECTROSURGICAL) ×3 IMPLANT
WARMER LAPAROSCOPE (MISCELLANEOUS) ×3 IMPLANT
WATER STERILE IRR 1000ML POUR (IV SOLUTION) ×9 IMPLANT

## 2013-07-03 NOTE — Anesthesia Preprocedure Evaluation (Addendum)
Anesthesia Evaluation  Patient identified by MRN, date of birth, ID band Patient awake    Reviewed: Allergy & Precautions, H&P , Patient's Chart, lab work & pertinent test results, reviewed documented beta blocker date and time   Airway Mallampati: III TM Distance: >3 FB Neck ROM: full    Dental no notable dental hx.    Pulmonary  breath sounds clear to auscultation  Pulmonary exam normal       Cardiovascular Rhythm:regular Rate:Normal     Neuro/Psych  Headaches,    GI/Hepatic GERD-  Medicated,  Endo/Other    Renal/GU      Musculoskeletal   Abdominal   Peds  Hematology   Anesthesia Other Findings   Reproductive/Obstetrics                          Anesthesia Physical Anesthesia Plan  ASA: II  Anesthesia Plan: General   Post-op Pain Management:    Induction: Intravenous  Airway Management Planned: Oral ETT  Additional Equipment:   Intra-op Plan:   Post-operative Plan: Extubation in OR  Informed Consent: I have reviewed the patients History and Physical, chart, labs and discussed the procedure including the risks, benefits and alternatives for the proposed anesthesia with the patient or authorized representative who has indicated his/her understanding and acceptance.   Dental Advisory Given and Dental advisory given  Plan Discussed with: CRNA and Surgeon  Anesthesia Plan Comments: (  Discussed general anesthesia, including possible nausea, instrumentation of airway, sore throat,pulmonary aspiration, etc. I asked if the were any outstanding questions, or  concerns before we proceeded. )        Anesthesia Quick Evaluation

## 2013-07-03 NOTE — Preoperative (Signed)
Beta Blockers   Reason not to administer Beta Blockers:Not Applicable 

## 2013-07-03 NOTE — Transfer of Care (Signed)
Immediate Anesthesia Transfer of Care Note  Patient: Lisa Li  Procedure(s) Performed: Procedure(s): ROBOTIC ASSISTED TOTAL HYSTERECTOMY WITH BILATERAL SALPINGO OOPHORECTOMY (Bilateral) CYSTOSCOPY (N/A)  Patient Location: PACU  Anesthesia Type:General  Level of Consciousness: awake  Airway & Oxygen Therapy: Patient Spontanous Breathing  Post-op Assessment: Report given to PACU RN  Post vital signs: stable  Filed Vitals:   07/03/13 0628  BP: 136/86  Pulse: 70  Temp: 36.7 C  Resp: 18    Complications: No apparent anesthesia complications

## 2013-07-03 NOTE — H&P (Signed)
Lisa Li is an 47 y.o. female with enlarging uterine fibroids here for robotic assisted TLH/BSO.  Risks and benefits have been discussed with patient.  She has menstrual related migraines.  She has done research and really feels that ovary removal will help her.  She has been counseled about lack of research for this.  She also has been clearly counseled about increased risk of cardiovascular disease with early ovary removal.  She still desires ovary removal.  Pertinent Gynecological History: Menses: regular Bleeding: heavy Contraception: none and but unneccesary due to personal choices DES exposure: denies Blood transfusions: none Sexually transmitted diseases: no past history Previous GYN Procedures: none  Last mammogram: normal Date: 9/14 Last pap: normal Date: 2012 OB History: G0, P0   Menstrual History: No LMP recorded.    Past Medical History  Diagnosis Date  . Migraine headache   . Myalgia     Chronic  . Frequent UTI   . Pre-diabetes     Past Surgical History  Procedure Laterality Date  . Appendectomy    . Cholecystectomy    . Tonsillectomy  1973  . Colonoscopy w/ endoscopic [place]  09/27/06    02/2011   . Esophagogastroduodenoscopy      Family History  Problem Relation Age of Onset  . Cancer Father     Prostate    Social History:  reports that she has never smoked. She has never used smokeless tobacco. She reports that she does not drink alcohol or use illicit drugs.  Allergies:  Allergies  Allergen Reactions  . Triptans Hives    Prescriptions prior to admission  Medication Sig Dispense Refill  . AMITIZA 24 MCG capsule Take 24 mcg by mouth daily with breakfast.       . diazepam (VALIUM) 5 MG tablet TAKE ONE TABLET BY MOUTH AT BEDTIME AS NEEDED.  30 tablet  1  . docusate sodium (COLACE) 100 MG capsule Take 100 mg by mouth 2 (two) times daily.      Marland Kitchen ERRIN 0.35 MG tablet Take 1 tablet by mouth daily.       Marland Kitchen FLUoxetine (PROZAC) 10 MG tablet Take 10 mg  by mouth daily. Pt. Sometimes takes 5mg .      . HYDROcodone-acetaminophen (NORCO/VICODIN) 5-325 MG per tablet Take 1 tablet by mouth every 6 (six) hours as needed for pain.  40 tablet  1  . lansoprazole (PREVACID) 15 MG capsule Take 15-30 mg by mouth daily.      Marland Kitchen lidocaine (XYLOCAINE) 5 % ointment Apply 1 application topically daily as needed.       . loratadine (CLARITIN) 10 MG tablet Take 10 mg by mouth daily.      . mupirocin ointment (BACTROBAN) 2 % Apply topically daily.  22 g  0  . naproxen (NAPROSYN) 500 MG tablet TAKE 1 TABLET BY MOUTH TWICE DAILY AS NEEDED FOR HEADACHE. (STOP MOBIC WHEN USING NAPROXEN.)  60 tablet  1  . propranolol (INDERAL) 10 MG tablet Take 20 mg by mouth 2 (two) times daily as needed.      . triamcinolone (NASACORT) 55 MCG/ACT nasal inhaler Place 2 sprays into the nose daily.  1 Inhaler  12    Review of Systems  All other systems reviewed and are negative.    Blood pressure 136/86, pulse 70, temperature 98.1 F (36.7 C), temperature source Oral, resp. rate 18, SpO2 100.00%. Physical Exam  Constitutional: She is oriented to person, place, and time. She appears well-developed and well-nourished.  Neck:  Normal range of motion.  Cardiovascular: Normal rate and regular rhythm.   Respiratory: Effort normal and breath sounds normal.  GI: Soft. Bowel sounds are normal.  Neurological: She is alert and oriented to person, place, and time.  Skin: Skin is warm and dry.  Psychiatric: She has a normal mood and affect.    Results for orders placed during the hospital encounter of 07/03/13 (from the past 24 hour(s))  PREGNANCY, URINE     Status: None   Collection Time    07/03/13  6:15 AM      Result Value Range   Preg Test, Ur NEGATIVE  NEGATIVE    No results found.  Assessment/Plan: 47 yo G0 SWF here for robotic TLH/BSO due to enlarging symptomatic uterine fibroids.  Risks and benefits have been dicussed with patient and she is here and ready to  proceed.  Valentina Shaggy [facility] 07/03/2013, 7:04 AM

## 2013-07-03 NOTE — Anesthesia Postprocedure Evaluation (Signed)
Anesthesia Post Note  Patient: Amayia Ciano Pals  Procedure(s) Performed: Procedure(s): ROBOTIC ASSISTED TOTAL HYSTERECTOMY WITH BILATERAL SALPINGO OOPHORECTOMY (Bilateral) CYSTOSCOPY (N/A)  Anesthesia type: General  Patient location: Women's Unit  Post pain: Pain level controlled  Post assessment: Post-op Vital signs reviewed  Last Vitals: BP 144/82  Pulse 83  Temp(Src) 36.4 C (Oral)  Resp 16  SpO2 98%  Post vital signs: Reviewed  Level of consciousness: awake  Complications: No apparent anesthesia complications

## 2013-07-03 NOTE — Progress Notes (Signed)
Day of Surgery Procedure(s) (LRB): ROBOTIC ASSISTED TOTAL HYSTERECTOMY WITH BILATERAL SALPINGO OOPHORECTOMY (Bilateral) CYSTOSCOPY (N/A)  Subjective: Patient reports incisional pain, tolerating PO and no problems voiding.    Objective: I have reviewed patient's vital signs, intake and output, medications and labs.  General: alert and cooperative Resp: clear to auscultation bilaterally Cardio: regular rate and rhythm, S1, S2 normal, no murmur, click, rub or gallop GI: soft, non-tender; bowel sounds normal; no masses,  no organomegaly and incision: clean, dry and intact Extremities: extremities normal, atraumatic, no cyanosis or edema Vaginal Bleeding: minimal  Assessment: s/p Procedure(s): ROBOTIC ASSISTED TOTAL HYSTERECTOMY WITH BILATERAL SALPINGO OOPHORECTOMY (Bilateral) CYSTOSCOPY (N/A): stable and progressing well  Plan: Advance diet Encourage ambulation Advance to PO medication Discontinue IV fluids  LOS: 0 days    Lisa Li 07/03/2013, 6:36 PM

## 2013-07-03 NOTE — Anesthesia Postprocedure Evaluation (Signed)
Anesthesia Post Note  Patient: Lisa Li  Procedure(s) Performed: Procedure(s) (LRB): ROBOTIC ASSISTED TOTAL HYSTERECTOMY WITH BILATERAL SALPINGO OOPHORECTOMY (Bilateral) CYSTOSCOPY (N/A)  Anesthesia type: General  Patient location: PACU  Post pain: Pain level controlled  Post assessment: Post-op Vital signs reviewed  Last Vitals:  Filed Vitals:   07/03/13 1100  BP: 152/80  Pulse: 82  Temp:   Resp: 14    Post vital signs: Reviewed  Level of consciousness: sedated  Complications: No apparent anesthesia complications

## 2013-07-03 NOTE — Op Note (Addendum)
07/03/2013  10:23 AM  PATIENT:  Lisa Li  47 y.o. female  PRE-OPERATIVE DIAGNOSIS:  Symptomatic fibroid uterus  POST-OPERATIVE DIAGNOSIS: Same  PROCEDURE:  Procedure(s): ROBOTIC ASSISTED TOTAL LAPAROSCOPIC HYSTERECTOMY WITH BILATERAL SALPINGO OOPHORECTOMY CYSTOSCOPY  SURGEON:  Dala Breault SUZANNE  ASSISTANTS: Conley Simmonds   ANESTHESIA:   general  ESTIMATED BLOOD LOSS: 50cc  BLOOD ADMINISTERED:none   FLUIDS: 2000cc LR  UOP: 400 cc clear  SPECIMEN:  Uterus, bilateral tubes and ovaries  DISPOSITION OF SPECIMEN:  PATHOLOGY  FINDINGS: omental adhesions beneath umbilicus, large liver, absent gall bladder and appendix, 5cm posterior fibroid, bowel adhesions to left IP ligament, normal appearing ovaries  DESCRIPTION OF OPERATION: Patient is taken to the operating room. She is placed in the supine position. She is a running IV in place. Informed consent was present on the chart. SCDs on her lower extremities and functioning properly. General endotracheal anesthesia was administered by the anesthesia staff without difficulty. Dr. Cristela Blue oversaw case. Once adequate anesthesia was confirmed the legs are placed in the low lithotomy position in Marvel stirrups. The patient was already on a beanbag. Her arms were tucked by the side. The beanbag was inflated to ensure that there would be no movement during the Trendelenburg placement.  Chlor prep was then used to prep the abdomen and Betadine was used to prep the inner thighs, perineum and vagina. Once 3 minutes had past the patient was draped in a normal standard fashion. The legs were lifted to the high lithotomy position. The cervix was visualized by placing a heavy weighted speculum in the posterior aspect of the vagina and using a curved Deaver retractor to the retract anteriorly. The anterior lip of the cervix was grasped with single-tooth tenaculum. The cervix sounded to 9 cm. Pratt dilators were used to dilate the cervix up to  a #21. A RUMI uterine manipulator was obtained. A #8 disposable tip was placed on the RUMI manipulator as well as a medium (3.0) KOH ring. This was passed through the cervix and the bulb of the disposable tip was inflated with 10 cc of normal saline. There was a good fit of the KOH ring around the cervix. The tenaculum was removed. There is also good manipulation of the uterus. The speculum and retractor were removed as well. A Foley catheter was placed to straight drain. The concentrated urine was noted. Legs were lowered to the low lithotomy position and attention was turned the abdomen.  Ropivacaine mixture (0.5% mixed one-to-one with normal saline) was used anesthetize the skin above the umbilicus.  A left upper quadrant approach was used due to prior surgery.  Skin was anesthetized with the Ropivacaine mixture.  5mm skin incision was made with #11 blade.  5mm laparoscope with optiview trochar attached was passed through the incision.  Once intraperitoneal placement was confirmed, pneumoperitoneum was achieved with low flow CO2 gas.    Omental adhesions were beneath the umbilicus interfering with where the laparoscope would be placed.  The locations for #1 and #2 ports were chosen.  Skin anesthetized and 8mm skin incision were made.  Non bladed 8mm trochar and ports were passed directly and good placement was confirmed.  Then using Cobra grasper and endoscopic scissors, the omental adhesions were taken down sharply.  Hemostasis was made with the monopolar cautery at one location on the adhesions were bleeding was present.    The umbilical incision was then made and the 12 mm laparoscopic port was placed.  The table was placed on  the floor and the patient was placed in steep Trendelenburg.  The robot was docked in a normal standard fashion to the left of the table. In the #1 arm was placed endoscopic scissors with monopolar cautery attached and then the #2 arm was placed PK [place] with bipolar cautery  attached. The systems are right side of the table for the right lower quadrant incision was located.  Attention was turned to the right side. With uterus on stretch the right ureter was noted.  Then the right IP was clamped, cauterized, and incised. Right round ligament was serially clamped cauterized and incised. The anterior and posterior peritoneum of the inferior leaf of the broad ligament were opened.  The bladder was taken down with sharp and blunt dissection below the level of the KOH ring. The right uterine artery skeletonized and then just superior to the KOH ring this vessel was serially clamped, cauterized, and incised.  Attention was turned the left side.  The bowel adhesions to the left IP ligament were incised sharply and carefully.  Then the left IP ligament was serially clamped, cauterized, and incised.  Next the left round ligament was serially clamped cauterized and incised. The anterior posterior peritoneum of the inferiorly for the broad ligament were opened. The anterior peritoneum was carried across to the dissection on the right side. The remainder of the bladder flap was created using sharp dissection. The bladder was well below the level of the KOH ring. The left uterine artery skeletonized. Then the left uterine artery, above the level of the KOH ring, was serially clamped cauterized and incised. The uterus was devascularized at this point.  The colpotomy was performed a starting in the midline and using monopolar cautery with an open edge of the scissors. This was carried around a circumferential fashion until the vaginal mucosa was completely incised in the specimen was freed.  The specimen was then delivered to the vagina.  A vaginal occlusive device was used to maintain the pneumoperitoneum.  Instruments were changed with a needle cut suture driver placed in arm 1 and a Cobra grasper placed #2. A V. lock suture was passed through the middle port. Starting in the right angle, the  cuff was closed incorporating the anterior and posterior vaginal mucosa in each stitch. This was carried across all the way to the left corner and a running fashion. To stitches were brought back towards the midline and the suture was cut flush with the vagina. The needle was brought out the pelvis. The pelvis was irrigated. All pedicles were inspected. No bleeding was noted. In Interceed was placed across vaginal cuff. Ureters were noted deep in the pelvis to be peristalsing. The instruments were removed. The robot was undocked. The patient was taken out of Trendelenburg positioning.   Because the cuff closure was close to the bladder, decision was made to proceed with cystoscopy at this point.  Indigo carmine was given IV.  The cystoscopy showed blue urine from the ureteral orifices, normal bladder mucosa, and no stiches in the bladder.    At this point the procedure was completed. The ports were removed under direct vision shove laparoscope and the pneumoperitoneum was relieved. Several deep breaths were given to the patient's trying to any gas out of the abdomen and finally the midline port was removed.  The midline port was closed at the fascial level with figure-of-eight suture of #0 Vicryl. The skin was then closed with subcuticular stitches of 3-0 Vicryl. The skin was cleansed Dermabond  was applied. Attention was then turned the vagina and the cuff was inspected. No bleeding was noted. The anterior posterior vaginal mucosa was incorporated in each stitch. The Foley catheter was removed. Sponge, lap, needle, initially counts were correct x2. Patient tolerated the procedure very well. She was awakened from anesthesia, extubated and taken to recovery in stable condition.   COUNTS:  YES  PLAN OF CARE: Transfer to PACU

## 2013-07-04 ENCOUNTER — Encounter (HOSPITAL_COMMUNITY): Payer: Self-pay | Admitting: Obstetrics & Gynecology

## 2013-07-04 ENCOUNTER — Other Ambulatory Visit: Payer: Self-pay | Admitting: Obstetrics & Gynecology

## 2013-07-04 MED ORDER — EST ESTROGENS-METHYLTEST 0.625-1.25 MG PO TABS: 1.0000 | ORAL_TABLET | Freq: Every day | ORAL | Status: DC

## 2013-07-04 MED ORDER — AZITHROMYCIN 250 MG PO TABS: ORAL_TABLET | ORAL | Status: DC

## 2013-07-04 NOTE — Progress Notes (Addendum)
Pt ambulated out teaching complete   Pt will get flu vaccine in [facility] office

## 2013-07-04 NOTE — Discharge Summary (Signed)
Physician Discharge Summary  Patient ID: Lisa Li MRN: [id number] DOB/AGE: 11/04/1965 47 y.o.  Admit date: 07/03/2013 Discharge date: 07/04/2013  Admission Diagnoses: uterine fibroid, pelvic pressure  Discharge Diagnoses:  Active Problems:   * No active hospital problems. *   Discharged Condition: good  Hospital Course: Patient admitted through same day surgery.  She was taken to OR where robotic assisted TLH/BSO/cystoscopy was performed.  Surgical findings included large posterior fibroid, omental adhesions at umbilicus, adhesions to left ovary/IP ligament.  Surgery was uneventful.  EBL 50cc.  Foley catheter was removed before leaving OR.  Patient transferred to PACU where she was stable and then to 3rd floor for the remainder of her hospitalization.  During her post-op recovery, her vitals and stable and she was AF.  In evening of POD#0, she was able to transition to oral pain medications and regular diet.  She was able to ambulate and she had good pain control.  She was also able to void on her own.  Patient seen both in the evening of POD#0 and AM of POD#1.  In the AM of POD#1, she was without complaint.  Post op hb was 12.8, decreased from 13.2, pre-operatively.  At this point, patient was voiding, walking, having excellent pain control, had no nausea, and minimal vaginal bleeding.  She was ready for D/C.   Consults: None  Significant Diagnostic Studies: labs: post op hb 12.8  Treatments: surgery: robotic assisted TLH/BSO/cystoscopy  Discharge Exam: Blood pressure 129/57, pulse 85, temperature 98.3 F (36.8 C), temperature source Oral, resp. rate 18, SpO2 100.00%. General appearance: alert and cooperative Resp: clear to auscultation bilaterally Cardio: regular rate and rhythm, S1, S2 normal, no murmur, click, rub or gallop GI: soft, non-tender; bowel sounds normal; no masses,  no organomegaly Extremities: extremities normal, atraumatic, no cyanosis or  edema Incision/Wound:clean/dry/intact  Disposition: Final discharge disposition not confirmed   Future Appointments Provider Department Dept Phone   07/10/2013 3:45 PM Annamaria Boots, MD [facility] C[facility] [phone]   08/03/2013 11:15 AM Annamaria Boots, MD [facility] HEALTH CARE [phone]   12/15/2013 10:45 AM Annamaria Boots, MD [facility] [phone]       Medication List    STOP taking these medications       ERRIN 0.35 MG tablet  Generic drug:  norethindrone     mupirocin ointment 2 %  Commonly known as:  BACTROBAN      TAKE these medications       AMITIZA 24 MCG capsule  Generic drug:  lubiprostone  Take 24 mcg by mouth daily with breakfast.     diazepam 5 MG tablet  Commonly known as:  VALIUM  TAKE ONE TABLET BY MOUTH AT BEDTIME AS NEEDED.     docusate sodium 100 MG capsule  Commonly known as:  COLACE  Take 100 mg by mouth 2 (two) times daily.     FLUoxetine 10 MG tablet  Commonly known as:  PROZAC  Take 10 mg by mouth daily. Pt. Sometimes takes 5mg .     HYDROcodone-acetaminophen 5-325 MG per tablet  Commonly known as:  NORCO/VICODIN  Take 1 tablet by mouth every 6 (six) hours as needed for pain.     lansoprazole 15 MG capsule  Commonly known as:  PREVACID  Take 15-30 mg by mouth daily.     lidocaine 5 % ointment  Commonly known as:  XYLOCAINE  Apply 1 application topically daily as needed.     loratadine 10  MG tablet  Commonly known as:  CLARITIN  Take 10 mg by mouth daily.     naproxen 500 MG tablet  Commonly known as:  NAPROSYN  TAKE 1 TABLET BY MOUTH TWICE DAILY AS NEEDED FOR HEADACHE. (STOP MOBIC WHEN USING NAPROXEN.)     propranolol 10 MG tablet  Commonly known as:  INDERAL  Take 20 mg by mouth 2 (two) times daily as needed.     triamcinolone 55 MCG/ACT nasal inhaler  Commonly known as:  NASACORT  Place 2 sprays into the nose daily.           Follow-up Information   Follow  up with Annamaria Boots, MD On 07/10/2013. (appt time 3:45pm)    Specialty:  Gynecology   Contact information:   [address] [place] [address] [phone]       Signed: Annamaria Boots 07/04/2013, 11:55 AM

## 2013-07-04 NOTE — Progress Notes (Signed)
Called in covaryx 0.625/1.25mg  q day.  #30/2 RF.

## 2013-07-07 ENCOUNTER — Encounter: Payer: Self-pay | Admitting: Obstetrics & Gynecology

## 2013-07-10 ENCOUNTER — Ambulatory Visit (INDEPENDENT_AMBULATORY_CARE_PROVIDER_SITE_OTHER): Payer: BC Managed Care – PPO | Admitting: Obstetrics & Gynecology

## 2013-07-10 ENCOUNTER — Encounter: Payer: Self-pay | Admitting: Obstetrics & Gynecology

## 2013-07-10 VITALS — BP 120/62 | HR 64 | Resp 16 | Ht 63.5 in | Wt 186.0 lb

## 2013-07-10 DIAGNOSIS — Z9889 Other specified postprocedural states: Secondary | ICD-10-CM

## 2013-07-10 MED ORDER — NYSTATIN 100000 UNIT/ML MT SUSP: 500000.0000 [IU] | Freq: Four times a day (QID) | OROMUCOSAL | Status: DC

## 2013-07-10 NOTE — Patient Instructions (Signed)
Please call and give me an update about hormonal symptoms.

## 2013-07-10 NOTE — Progress Notes (Signed)
Post Operative Visit  Procedure:Robotic Assisted Total Hysterectomy/BSO Days Post-op: 8  Subjective: Here for follow-up after robotic TLH/BSO.  Took nothing today for pain mediction.  Went to pumpkin patch and rode on hay ride.  Bladder function is normal.  No fevers.  Minimal spotting. Went to work today for about three hours.  Drove her car home from work today--just a couple of miles.  Started on bisacodyl once she got home after discharge.  Having regular bowel movement.  Reports she has felt stomach cramping  Objective: BP 120/62  Pulse 64  Resp 16  Ht 5' 3.5" (1.613 m)  Wt 186 lb (84.369 kg)  BMI 32.43 kg/m2  LMP 05/31/2013  EXAM General: alert and cooperative Resp: clear to auscultation bilaterally Cardio: regular rate and rhythm, S1, S2 normal, no murmur, click, rub or gallop GI: soft, non-tender; bowel sounds normal; no masses,  no organomegaly Extremities: extremities normal, atraumatic, no cyanosis or edema Vaginal Bleeding: none  Assessment: s/p robotic assisted TLH/BSO  Plan: Recheck 3 weeks Switch to Minivelle 0.1mg  patches.  Samples given.

## 2013-07-13 ENCOUNTER — Encounter: Payer: Self-pay | Admitting: Obstetrics and Gynecology

## 2013-07-13 ENCOUNTER — Ambulatory Visit (INDEPENDENT_AMBULATORY_CARE_PROVIDER_SITE_OTHER): Payer: BC Managed Care – PPO | Admitting: Obstetrics and Gynecology

## 2013-07-13 ENCOUNTER — Telehealth: Payer: Self-pay | Admitting: Obstetrics & Gynecology

## 2013-07-13 VITALS — BP 110/70 | HR 70 | Ht 63.75 in | Wt 186.5 lb

## 2013-07-13 DIAGNOSIS — M549 Dorsalgia, unspecified: Secondary | ICD-10-CM

## 2013-07-13 DIAGNOSIS — N760 Acute vaginitis: Secondary | ICD-10-CM

## 2013-07-13 LAB — CBC
HCT: 39.5 % (ref 36.0–46.0)
Hemoglobin: 13.1 g/dL (ref 12.0–15.0)
MCH: 26.5 pg (ref 26.0–34.0)
MCHC: 33.2 g/dL (ref 30.0–36.0)
Platelets: 242 10*3/uL (ref 150–400)
WBC: 8.1 10*3/uL (ref 4.0–10.5)

## 2013-07-13 LAB — COMPREHENSIVE METABOLIC PANEL
ALT: 28 U/L (ref 0–35)
AST: 22 U/L (ref 0–37)
Albumin: 4.4 g/dL (ref 3.5–5.2)
Alkaline Phosphatase: 42 U/L (ref 39–117)
BUN: 14 mg/dL (ref 6–23)
CO2: 28 mEq/L (ref 19–32)
Calcium: 9.5 mg/dL (ref 8.4–10.5)
Chloride: 102 mEq/L (ref 96–112)
Creat: 0.84 mg/dL (ref 0.50–1.10)
Glucose, Bld: 93 mg/dL (ref 70–99)
Potassium: 4.2 mEq/L (ref 3.5–5.3)

## 2013-07-13 LAB — LIPASE: Lipase: 41 U/L (ref 0–75)

## 2013-07-13 NOTE — Progress Notes (Signed)
Patient ID: Lisa Li, female   DOB: [birth year], 47 y.o.   MRN: [id number] GYNECOLOGY PROBLEM VISIT  PCP:  Lilyan Punt, MD  Referring provider:   HPI: 47 y.o.   Single  Caucasian  female   G0P0 with Patient's last menstrual period was 05/31/2013.   Status post robotic total laparoscopic hysterectomy with bilateral salpingo-oophorectomy and cystoscopy 07/03/13. here 10 days post op with back discomfort and vaginal itching.   Back pain started 4 days ago.   2 kinds of back pain. General Mid upper pain and travels down. Some stabbinging focal pain as well. Taking pain medication - percocet - to treat it.  Feels like her normal cramps but much worse. History of kidney stones.  Passed one this summer.  No dysuria.  Some increased activity this week.  Stepped up on a trailer to see some pumpkins.  Back at work a few hours a day.   Had a little bit of spotting yesterday.   Normal BMs every day.   Took off Estrogen patch after took a shower today.  Feeling hot.   Patient took a Z pack for respiratory infection she got while inside hospital. Scratchy throat and some cough.   Overall feeling better.   Some vagina itching and tenderness. Just had thrush.   GYNECOLOGIC HISTORY: Patient's last menstrual period was 05/31/2013. Sexually active: not currently  Contraception:   hysterectomy Menopausal hormone therapy: minivelle DES exposure:  no  Blood transfusions:   no Sexually transmitted diseases:   no GYN Procedures: Robotic-TLH/BSO/Cystoscopy Mammogram: last week at [facility]                 OB History   Grav Para Term Preterm Abortions TAB SAB Ect Mult Living   0                  Family History  Problem Relation Age of Onset  . Cancer Father     Prostate    Patient Active Problem List   Diagnosis Date Noted  . Migraine 02/15/2013  . 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 Bleeding internal hemorrhoids 03/28/2012    Past Medical History  Diagnosis Date   . Migraine headache   . Myalgia     Chronic  . Frequent UTI   . Pre-diabetes     Past Surgical History  Procedure Laterality Date  . Appendectomy    . Cholecystectomy    . Tonsillectomy  1973  . Colonoscopy w/ endoscopic [place]  09/27/06    02/2011   . Esophagogastroduodenoscopy    . Robotic assisted total hysterectomy with bilateral salpingo oopherectomy Bilateral 07/03/2013    Procedure: ROBOTIC ASSISTED TOTAL HYSTERECTOMY WITH BILATERAL SALPINGO OOPHORECTOMY;  Surgeon: Annamaria Boots, MD;  Location: [facility];  Service: Gynecology;  Laterality: Bilateral;  . Cystoscopy N/A 07/03/2013    Procedure: CYSTOSCOPY;  Surgeon: Annamaria Boots, MD;  Location: [facility] OR[facility];  Service: Gynecology;  Laterality: N/A;    ALLERGIES: Triptans  Current Outpatient Prescriptions  Medication Sig Dispense Refill  . AMITIZA 24 MCG capsule Take 24 mcg by mouth daily with breakfast.       . diazepam (VALIUM) 5 MG tablet TAKE ONE TABLET BY MOUTH AT BEDTIME AS NEEDED.  30 tablet  1  . docusate sodium (COLACE) 100 MG capsule Take 100 mg by mouth as needed.       Marland Kitchen estradiol (VIVELLE-DOT) 0.0375 MG/24HR Place 1 patch onto the skin 2 (two) times a week.      Marland Kitchen  FLUoxetine (PROZAC) 10 MG tablet Take 10 mg by mouth daily. Pt. Sometimes takes 5mg .      . HYDROcodone-acetaminophen (NORCO/VICODIN) 5-325 MG per tablet Take 1 tablet by mouth every 6 (six) hours as needed for pain.  40 tablet  1  . lansoprazole (PREVACID) 15 MG capsule Take 15-30 mg by mouth daily.      Marland Kitchen loratadine (CLARITIN) 10 MG tablet Take 10 mg by mouth daily.      . naproxen (NAPROSYN) 500 MG tablet TAKE 1 TABLET BY MOUTH TWICE DAILY AS NEEDED FOR HEADACHE. (STOP MOBIC WHEN USING NAPROXEN.)  60 tablet  1  . nystatin (MYCOSTATIN) 100000 UNIT/ML suspension Take 5 mLs (500,000 Units total) by mouth 4 (four) times daily.  60 mL  0  . oxyCODONE-acetaminophen (PERCOCET/ROXICET) 5-325 MG per tablet as needed.      . propranolol (INDERAL) 10 MG tablet  Take 20 mg by mouth 2 (two) times daily as needed.      . triamcinolone (NASACORT) 55 MCG/ACT nasal inhaler Place 2 sprays into the nose daily.  1 Inhaler  12   No current facility-administered medications for this visit.     ROS:  Pertinent items are noted in HPI.  PHYSICAL EXAMINATION:    BP 110/70  Pulse 70  Ht 5' 3.75" (1.619 m)  Wt 186 lb 8 oz (84.596 kg)  BMI 32.27 kg/m2  LMP 05/31/2013   Wt Readings from Last 3 Encounters:  07/13/13 186 lb 8 oz (84.596 kg)  07/10/13 186 lb (84.369 kg)  06/26/13 183 lb 6.4 oz (83.19 kg)     Ht Readings from Last 3 Encounters:  07/13/13 5' 3.75" (1.619 m)  07/10/13 5' 3.5" (1.613 m)  06/26/13 5' 3.75" (1.619 m)    General appearance: alert, cooperative and appears stated age Head: Normocephalic, without obvious abnormality, atraumatic Lungs: clear to auscultation bilaterally Heart: regular rate and rhythm Abdomen: soft, non-tender; no masses,  no organomegaly Back:  No CVA tenderness. Extremities: extremities normal, atraumatic, no cyanosis or edema Skin: Skin color, texture, turgor normal. No rashes or lesions Lymph nodes: Cervical, supraclavicular, and axillary nodes normal. No abnormal inguinal nodes palpated Neurologic: Grossly normal  Pelvic: External genitalia:  no lesions              Urethra:  normal appearing urethra with no masses, tenderness or lesions              Bartholins and Skenes: normal                 Vagina:  Cuff intact.               Cervix: absent.              Bimanual Exam:  Uterus:  absent                                      Adnexa: no masses         Wet prep - pH 4.5, negative for yeast, clue cells, trichomonas.                               ASSESSMENT  Post op back pain. I suspect musculoskeletal. No vaginitis.    PLAN  Will check CMP, CBC, amylase, lipase. Decrease activity.   Return for persistent or worsening pain.  Keep op  appointment with Dr. Hyacinth Meeker.  An After Visit Summary was  printed and given to the patient.

## 2013-07-13 NOTE — Telephone Encounter (Addendum)
Spoke with patient. She is currently at work. States she is working mornings and then coming home in afternoons and resting.   Wanted to update Dr. Hyacinth Meeker.   States that back pain that started on Sunday is not "easing up", having pain "in the middle of my back, feels like it is breaking into two". Taking percocet with some relief, but states that the relief doesn't last for long. Wants to know if this is normal for post op healing. How long should she expect pain? Any other treatment?   Was recently on the Zpak and treated for oral thrush, now having vaginal itching that is ongoing since Tuesday evening. No discharge, no dysuria, no fevers, no abdominal pain. Would like treatment for vaginal itching and irritation.   Patient noticed pain on R side yesterday and then had some slight spotting, then spotting stopped.   Will route to Dr. Hyacinth Meeker for review, D/W Dr. Edward Jolly who advised patient should have OV today with Dr. Edward Jolly. Patient agreeable and advised to come now for appointment.

## 2013-07-13 NOTE — Telephone Encounter (Signed)
Pt says dr Hyacinth Meeker wanted her to call and give an update on how she is doing. She is having some problems and would like nurse to call. If can't reach her on cell please call [phone].

## 2013-07-13 NOTE — Patient Instructions (Signed)
Please reduce your activity and call if your pain worsens.  We will contact you with your blood test results tomorrow.

## 2013-07-15 ENCOUNTER — Encounter: Payer: Self-pay | Admitting: Obstetrics and Gynecology

## 2013-08-03 ENCOUNTER — Ambulatory Visit (INDEPENDENT_AMBULATORY_CARE_PROVIDER_SITE_OTHER): Payer: BC Managed Care – PPO | Admitting: Obstetrics & Gynecology

## 2013-08-03 ENCOUNTER — Encounter: Payer: Self-pay | Admitting: Obstetrics & Gynecology

## 2013-08-03 VITALS — BP 122/78 | HR 78 | Resp 16 | Wt 190.0 lb

## 2013-08-03 DIAGNOSIS — Z9889 Other specified postprocedural states: Secondary | ICD-10-CM

## 2013-08-03 MED ORDER — ESTRADIOL 0.0375 MG/24HR TD PTTW: 1.0000 | MEDICATED_PATCH | TRANSDERMAL | Status: DC

## 2013-08-03 NOTE — Progress Notes (Signed)
Post Operative Visit  Procedure: robotic TLH/BSO Days Post-op: 31  Subjective: About three weeks ago, she stepped up on a trailer and felt a little pulling.  Back started to bother her.  She had some spotting.  She called and was seen by Dr. Edward Jolly.  Did restart pain medication but she has weaned off again.  Back pain is resolved.  Doing well from hormonal standpoint.  Still having some headaches.  We had hoped taking ovaries would help with this.  Patient and I had lengthy discussion about this so she knew that this might not fix headaches.  Urinating fine.  Also reports less pressure on rectum.  Does report some tenderness at patch site.   Helped a friend move out of his house recently.  Feels like she overdid it a little with helping.  Did not life anything helping.    Objective: BP 122/78  Pulse 78  Resp 16  Wt 190 lb (86.183 kg)  BMI 32.88 kg/m2  LMP 05/31/2013  EXAM General: alert and cooperative GI: soft, non-tender; bowel sounds normal; no masses,  no organomegaly and incision: clean, dry and intact Extremities: extremities normal, atraumatic, no cyanosis or edema Vaginal Bleeding: none  Assessment: s/p TLH/BSO  Plan: One year follow-up. Continue Vivelle dot 0.037mg  patches twice weekly.  Rx for year given.  Pt will check to see if Minivelle is cheaper

## 2013-08-03 NOTE — Patient Instructions (Signed)
Please call if you have any new problems/issues 

## 2013-08-17 ENCOUNTER — Other Ambulatory Visit: Payer: Self-pay

## 2013-09-05 ENCOUNTER — Other Ambulatory Visit: Payer: Self-pay

## 2013-09-05 NOTE — Telephone Encounter (Signed)
Please advise if ok to refill. It looks like we filled 06/21/13 for her. Chart is in your box for review.

## 2013-09-06 MED ORDER — PROPRANOLOL HCL 10 MG PO TABS
20.0000 mg | ORAL_TABLET | Freq: Two times a day (BID) | ORAL | Status: DC | PRN
Start: 2013-09-05 — End: 2014-09-14

## 2013-09-27 ENCOUNTER — Ambulatory Visit (INDEPENDENT_AMBULATORY_CARE_PROVIDER_SITE_OTHER): Payer: BC Managed Care – PPO | Admitting: Family Medicine

## 2013-09-27 ENCOUNTER — Encounter: Payer: Self-pay | Admitting: Family Medicine

## 2013-09-27 VITALS — BP 136/98 | Ht 63.0 in | Wt 194.4 lb

## 2013-09-27 DIAGNOSIS — R7309 Other abnormal glucose: Secondary | ICD-10-CM

## 2013-09-27 DIAGNOSIS — G5600 Carpal tunnel syndrome, unspecified upper limb: Secondary | ICD-10-CM

## 2013-09-27 DIAGNOSIS — R7303 Prediabetes: Secondary | ICD-10-CM

## 2013-09-27 DIAGNOSIS — G43909 Migraine, unspecified, not intractable, without status migrainosus: Secondary | ICD-10-CM

## 2013-09-27 MED ORDER — HYDROCODONE-ACETAMINOPHEN 5-325 MG PO TABS: 1.0000 | ORAL_TABLET | Freq: Four times a day (QID) | ORAL | Status: DC | PRN

## 2013-09-27 MED ORDER — CANAGLIFLOZIN 100 MG PO TABS: 1.0000 | ORAL_TABLET | Freq: Every morning | ORAL | Status: DC

## 2013-09-27 NOTE — Progress Notes (Signed)
   Subjective:    Patient ID: Lisa Li, female    DOB: 1966/05/02, 47 y.o.   MRN: [id number]  HPI Patient is here today to go over blood work that she had done in August. Labs reviewed  Pt c/o numbness in hands wakes her up some radiation up into the neck  She would also like to talk to you about her migraines. 3 bad ha per week some nausea, awakens at night with ha, ha worse past 2 months, trying beta blocker w/o help pmh reviewed 25 min with pt   Review of Systems  Constitutional: Negative for fever, activity change, appetite change and fatigue.  HENT: Negative for congestion.   Respiratory: Negative for cough and shortness of breath.   Cardiovascular: Negative for chest pain.  Gastrointestinal: Negative for abdominal pain.  Genitourinary: Negative for dysuria.  Neurological: Negative for dizziness, tremors and weakness.       Objective:   Physical Exam  Vitals reviewed. Constitutional: She is oriented to person, place, and time. She appears well-developed and well-nourished.  HENT:  Head: Normocephalic.  Right Ear: External ear normal.  Left Ear: External ear normal.  Neck: Normal range of motion. Neck supple. No thyromegaly present.  Cardiovascular: Normal rate, regular rhythm and normal heart sounds.   No murmur heard. Pulmonary/Chest: Effort normal and breath sounds normal. No respiratory distress.  Abdominal: Soft. Bowel sounds are normal. She exhibits no distension.  Musculoskeletal: Normal range of motion. She exhibits no edema.  Neurological: She is alert and oriented to person, place, and time.     25 min with pt     Assessment & Plan:  DM- start Invokana(can't tolerate metformin), recheclk a1c in 3 months htn diet exercise lose wt Migraine severe, check MRI see above, can't tolerat topamax, increase propanolol Numbness in hands check NCV in hsands

## 2013-10-03 ENCOUNTER — Ambulatory Visit (HOSPITAL_COMMUNITY)
Admission: RE | Admit: 2013-10-03 | Discharge: 2013-10-03 | Disposition: A | Discharge status: Home / Self Care | Payer: BC Managed Care – PPO | Source: Ambulatory Visit | Attending: Family Medicine | Admitting: Family Medicine

## 2013-10-03 ENCOUNTER — Encounter: Payer: Self-pay | Admitting: Neurology

## 2013-10-03 ENCOUNTER — Ambulatory Visit (INDEPENDENT_AMBULATORY_CARE_PROVIDER_SITE_OTHER): Payer: BC Managed Care – PPO | Admitting: Neurology

## 2013-10-03 VITALS — BP 122/90 | HR 65 | Temp 97.8°F | Resp 14 | Ht 63.0 in | Wt 192.8 lb

## 2013-10-03 DIAGNOSIS — IMO0002 Reserved for concepts with insufficient information to code with codable children: Secondary | ICD-10-CM

## 2013-10-03 DIAGNOSIS — D472 Monoclonal gammopathy: Secondary | ICD-10-CM

## 2013-10-03 DIAGNOSIS — G43709 Chronic migraine without aura, not intractable, without status migrainosus: Secondary | ICD-10-CM

## 2013-10-03 DIAGNOSIS — R209 Unspecified disturbances of skin sensation: Secondary | ICD-10-CM

## 2013-10-03 DIAGNOSIS — G43909 Migraine, unspecified, not intractable, without status migrainosus: Secondary | ICD-10-CM | POA: Insufficient documentation

## 2013-10-03 DIAGNOSIS — R52 Pain, unspecified: Secondary | ICD-10-CM

## 2013-10-03 DIAGNOSIS — G8929 Other chronic pain: Secondary | ICD-10-CM

## 2013-10-03 LAB — POCT I-STAT CREATININE: Creatinine, Ser: 1 mg/dL (ref 0.50–1.10)

## 2013-10-03 LAB — TSH: TSH: 3.4 u[IU]/mL (ref 0.35–5.50)

## 2013-10-03 MED ORDER — GADOBENATE DIMEGLUMINE 529 MG/ML IV SOLN
18.0000 mL | Freq: Once | INTRAVENOUS | Status: AC | PRN
  Administered 2013-10-03: 18 mL via INTRAVENOUS

## 2013-10-03 NOTE — Progress Notes (Signed)
[facility] Clinic Note - Initial Visit   Date: 10/03/2013    Lisa Li MRN: [id number] DOB: 25-Jul-1966   Dear Dr Gerda Diss:  Thank you for your kind referral of Lisa Li for consultation of paresthesias of her hands. Although her history is well known to you, please allow [place] to reiterate it for the purpose of our medical record. The patient was accompanied to the clinic by self.   History of Present Illness: Lisa Li is a 47 y.o. right-handed Caucasian female with history of GERD, depression, pre-diabetes, constipation, MGUS and migraines presenting for evaluation of numbness/tingling of her hands.    Since early 2000s, she started experiencing numbness of her hands and legs especially at bedtime.  Over the years, symptoms have progressed because it occurs more often, now occuring daily.  Symptoms are constant and exacerbated by activity and rest.  She has throbbing sensation and tingling of the arms and hands, but also has generalized pain.  Denies any alleviating factors.  She complains of extensive generalized whole body pain (shoulder, elbows, hands, wrist, fingers, back, rib cage, hips, knees, ankles, feet).  She also has history of migraines (since her teen years) and is taking inderal 20mg  twice daily.  She has previously been on amitriptyline, nortriptyline, topamax, and nerve blocks.  She has not had botox.  She reports having headaches 5-15 per month.  She tells me that headaches were hormone related so had a hysterectomy, but has no noticed any change in symptoms.  She has about three headache free days per month.  Pain is at the base of her head, characterized as throbbing.  She gets photosensitivity.  No nausea, vomiting, or phonophobia.  Bending over triggers her migraine.  She is scheduled to have a MRI brain later today.  No prior brain imaging. For acute pain, she takes vicodin and valium which helps.  She was seen by headache clinic  in the past.    She has previously seen Dr. Sandria Manly for the above complaints.  She had extensive lab testing which revealed MGUS. She was seeing Oncology for his, last seen by Dr. Dalene Carrow who has since left the practice.   Out-side paper records, electronic medical record, and images have been reviewed where available and summarized as:  SPEP with IFE 10/10/2012:  Monoclonal IgG kappa protein   Component     Latest Ref Rng 12/16/2007  Vitamin B-12     211 - 911 pg/mL 605  ANA     NEGATIVE NEG  Rheumatoid Factor     0 - 20 IU/mL < 20    Past Medical History  Diagnosis Date  . Migraine headache   . Myalgia     Chronic  . Frequent UTI   . Pre-diabetes   . MGUS (monoclonal gammopathy of unknown significance)     Past Surgical History  Procedure Laterality Date  . Appendectomy    . Cholecystectomy    . Tonsillectomy  1973  . Colonoscopy w/ endoscopic [place]  09/27/06    02/2011   . Esophagogastroduodenoscopy    . Robotic assisted total hysterectomy with bilateral salpingo oopherectomy Bilateral 07/03/2013    Procedure: ROBOTIC ASSISTED TOTAL HYSTERECTOMY WITH BILATERAL SALPINGO OOPHORECTOMY;  Surgeon: Annamaria Boots, MD;  Location: [facility];  Service: Gynecology;  Laterality: Bilateral;  . Cystoscopy N/A 07/03/2013    Procedure: CYSTOSCOPY;  Surgeon: Annamaria Boots, MD;  Location: [facility];  Service: Gynecology;  Laterality: N/A;  Medications:  Current Outpatient Prescriptions on File Prior to Visit  Medication Sig Dispense Refill  . AMITIZA 24 MCG capsule Take 24 mcg by mouth daily with breakfast.       . Canagliflozin 100 MG TABS Take 1 tablet (100 mg total) by mouth every morning.  30 tablet  6  . diazepam (VALIUM) 5 MG tablet TAKE ONE TABLET BY MOUTH AT BEDTIME AS NEEDED.  30 tablet  1  . docusate sodium (COLACE) 100 MG capsule Take 100 mg by mouth as needed.       Marland Kitchen estradiol (VIVELLE-DOT) 0.0375 MG/24HR Place 1 patch onto the skin 2 (two) times a week.  24 patch  4  .  FLUoxetine (PROZAC) 10 MG tablet Take 10 mg by mouth daily. Pt. Sometimes takes 5mg .      . HYDROcodone-acetaminophen (NORCO/VICODIN) 5-325 MG per tablet Take 1 tablet by mouth every 6 (six) hours as needed.  40 tablet  0  . lansoprazole (PREVACID) 15 MG capsule Take 15-30 mg by mouth daily.      Marland Kitchen loratadine (CLARITIN) 10 MG tablet Take 10 mg by mouth daily.      . naproxen (NAPROSYN) 500 MG tablet TAKE 1 TABLET BY MOUTH TWICE DAILY AS NEEDED FOR HEADACHE. (STOP MOBIC WHEN USING NAPROXEN.)  60 tablet  1  . propranolol (INDERAL) 10 MG tablet Take 2 tablets (20 mg total) by mouth 2 (two) times daily as needed.  60 tablet  12  . triamcinolone (NASACORT) 55 MCG/ACT nasal inhaler Place 2 sprays into the nose daily.  1 Inhaler  12   No current facility-administered medications on file prior to visit.    Allergies:  Allergies  Allergen Reactions  . Triptans Hives    Family History: Family History  Problem Relation Age of Onset  . Prostate cancer Father     Deceased, 27  . COPD Mother     Deceased, 45  . Emphysema Sister   . Healthy Brother     Social History: History   Social History  . Marital Status: Single    Spouse Name: N/A    Number of Children: N/A  . Years of Education: N/A   Occupational History  . Not on file.   Social History Main Topics  . Smoking status: Never Smoker   . Smokeless tobacco: Never Used  . Alcohol Use: No  . Drug Use: No  . Sexual Activity: Yes    Partners: Female    Pharmacist, hospital Protection: Surgical     Comment: R-TLH/BSO   Other Topics Concern  . 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   Lives alone.  No children.    Review of Systems:  CONSTITUTIONAL: No fevers, chills, night sweats, or weight loss.   EYES: No visual changes or eye pain ENT: No hearing changes.  No history of nose bleeds.   RESPIRATORY: No cough, wheezing and shortness of breath.     CARDIOVASCULAR: Negative for chest pain, and palpitations.   GI: Negative for abdominal discomfort, blood in stools or black stools.  No recent change in bowel habits.   GU:  No history of incontinence.   MUSCLOSKELETAL: No history of joint pain or swelling.  No myalgias.   SKIN: Negative for lesions, rash, and itching.   HEMATOLOGY/ONCOLOGY: Negative for prolonged bleeding, bruising easily, and swollen nodes.  ENDOCRINE: Negative for cold or heat intolerance, polydipsia or  goiter.   PSYCH:  +depression or anxiety symptoms.   NEURO: As Above.   Vital Signs:  BP 122/90  Pulse 65  Temp(Src) 97.8 F (36.6 C)  Resp 14  Ht 5\' 3"  (1.6 m)  Wt 192 lb 12.8 oz (87.454 kg)  BMI 34.16 kg/m2  LMP 05/31/2013   General Medical Exam:   General:  Well appearing, comfortable.  Fibromyalgia tenderpoints at all 18/18 sites   Neurological Exam: MENTAL STATUS including orientation to time, place, person, recent and remote memory, attention span and concentration, language, and fund of knowledge is normal.  Speech is not dysarthric.  CRANIAL NERVES: II:  No visual field defects.  Unremarkable fundi.   III-IV-VI: Pupils equal round and reactive to light.  Normal conjugate, extra-ocular eye movements in all directions of gaze.  No nystagmus.  No ptosis.   V:  Normal facial sensation. VII:  Normal facial symmetry and movements.    VIII:  Normal hearing and vestibular function.   IX-X:  Normal palatal movement.   XI:  Normal shoulder shrug and head rotation.   XII:  Normal tongue strength and range of motion, no deviation or fasciculation.  MOTOR:  No atrophy, fasciculations or abnormal movements.  No pronator drift.  Tone is normal.    Right Upper Extremity:    Left Upper Extremity:    Deltoid  5/5   Deltoid  5/5   Biceps  5/5   Biceps  5/5   Triceps  5/5   Triceps  5/5   Wrist extensors  5/5   Wrist extensors  5/5   Wrist flexors  5/5   Wrist flexors  5/5   Finger extensors  5/5   Finger  extensors  5/5   Finger flexors  5/5   Finger flexors  5/5   Dorsal interossei  5/5   Dorsal interossei  5/5   Abductor pollicis  5/5   Abductor pollicis  5/5   Tone (Ashworth scale)  0  Tone (Ashworth scale)  0   Right Lower Extremity:    Left Lower Extremity:    Hip flexors  5/5   Hip flexors  5/5   Hip extensors  5/5   Hip extensors  5/5   Knee flexors  5/5   Knee flexors  5/5   Knee extensors  5/5   Knee extensors  5/5   Dorsiflexors  5/5   Dorsiflexors  5/5   Plantarflexors  5/5   Plantarflexors  5/5   Toe extensors  5/5   Toe extensors  5/5   Toe flexors  5/5   Toe flexors  5/5   Tone (Ashworth scale)  0  Tone (Ashworth scale)  0   MSRs:  Right                                                                 Left brachioradialis 2+  brachioradialis 2+  biceps 2+  biceps 2+  triceps 2+  triceps 2+  patellar 2+  patellar 2+  ankle jerk 2+  ankle jerk 2+  Hoffman no  Hoffman no  plantar response down  plantar response down   SENSORY:  Normal and symmetric perception of light touch, pinprick, vibration, and proprioception.   COORDINATION/GAIT: Normal finger-to- nose-finger and  heel-to-shin.  Intact rapid alternating movements bilaterally.  Able to rise from a chair without using arms.  Gait narrow based and stable. Tandem and stressed gait intact.    IMPRESSION: 1.  Paresthesias of her upper extremities  - Normal sensation on exam  - Long history of parethesias, previously evaluated by Dr. Sandria Manly and found to have MGUS. No prior EMG  - Etiology unclear at this time, CTS vs radiculopathy vs complex migraine.  Will obtain EMG to better characterize symptoms 2.  Chronic migraine  - No benefit with topamax, TCAs, or nerve blocks  - Continue inderal 20mg  BID  - Discussed option of Botox going forward 3.  MGUS  - Doubt this is contributing to her paresthesias 4.  Generalized pain of her arms and legs, ?possible fibromyalgia  - She has 18/18 tenderpoints     PLAN/RECOMMENDATIONS:  1.  Check vitamin B12, copper, TSH  2.  EMG of the upper extremities (R >L) 3.  Start taking magnesium supplements 400mg  daily 4.  Limit abortive headache medication to no more than 9 days per month to prevent medication overuse headaches 5.  Follow-up with oncologist for MGUS 6.  Return to clinic in 28-month  The duration of this appointment visit was 60 minutes of face-to-face time with the patient.  Greater than 50% of this time was spent in counseling, explanation of diagnosis, planning of further management, and coordination of care.   Thank you for allowing me to participate in patient's care.  If I can answer any additional questions, I would be pleased to do so.    Sincerely,    Abdirahman Chittum K. Allena Katz, DO

## 2013-10-03 NOTE — Patient Instructions (Signed)
1.  Check blood work today 2.  EMG of the upper extremities (R >L) 3.  Start taking magnesium supplements 400mg  daily 4.  Limit abortive headache medication to no more than 9 days per month 5.  Follow-up with oncologist for MGUS 6.  Return to clinic in 12-month

## 2013-10-09 ENCOUNTER — Telehealth: Payer: Self-pay

## 2013-10-09 NOTE — Telephone Encounter (Signed)
Called pt and relayed your message. She agreed to start taking B12 supplements.

## 2013-10-20 ENCOUNTER — Other Ambulatory Visit: Payer: Self-pay | Admitting: Family Medicine

## 2013-10-31 ENCOUNTER — Telehealth: Payer: Self-pay | Admitting: Neurology

## 2013-10-31 ENCOUNTER — Telehealth: Payer: Self-pay | Admitting: Obstetrics & Gynecology

## 2013-10-31 NOTE — Telephone Encounter (Signed)
Yes.  Absolutely. She can go out further if she needs.  Had surgery in the fall so is ok to even wait that long.  That is normally what I do.  We just didn't cancel her appt because she already had one.

## 2013-10-31 NOTE — Telephone Encounter (Signed)
LMTCB.   (Is pt using Vivelle or minivelle?)

## 2013-10-31 NOTE — Telephone Encounter (Signed)
Spoke with pt who is currently working full time until near the end of April. Pt wondering if she can wait until then for her recheck and Aex. When asked if she was having any problems, she replied that she sometimes had a bloated feeling, or just a feeling of "being bigger." Denies pain or bleeding. Pt thinks her body is adjusting to post op status, and doesn't necessarily feel the bloating is a "problem." Only happens sometimes. Pt is currently using minivelle, as she had a discount card for it and that is what the pharmacist selected. Pt will do whatever Dr. Sabra Heck thinks is best, but will not be working full time after 01-31-14. Please advise on return visit.

## 2013-10-31 NOTE — Telephone Encounter (Signed)
Pt would like nurse to check with dr Lisa Li to see if it's ok for her to wait until after April 22 for her reck and aex because she is working now and that time will be better for her.  If so she will need a refill for the minivelle before that time.

## 2013-10-31 NOTE — Telephone Encounter (Signed)
Pt has started a new position and had to move her EMG until April / Sherri

## 2013-10-31 NOTE — Telephone Encounter (Signed)
Noted.  Jeovani Weisenburger K. Kalyiah Saintil, DO   

## 2013-11-01 NOTE — Telephone Encounter (Signed)
Spoke with patient. Message from Dr. Sabra Heck given.  AEX changed from 3/6 to May 19. Patient agreeable. Will call back prn.

## 2013-11-01 NOTE — Telephone Encounter (Signed)
See next phone and surgical notes.    Routed to provider for signature, encounter closed.

## 2013-11-02 ENCOUNTER — Encounter: Payer: BC Managed Care – PPO | Admitting: Neurology

## 2013-11-07 ENCOUNTER — Ambulatory Visit: Payer: BC Managed Care – PPO | Admitting: Neurology

## 2013-11-07 ENCOUNTER — Ambulatory Visit: Payer: BC Managed Care – PPO | Admitting: Obstetrics & Gynecology

## 2013-11-13 ENCOUNTER — Ambulatory Visit: Payer: BC Managed Care – PPO | Admitting: Family Medicine

## 2013-12-13 ENCOUNTER — Ambulatory Visit: Payer: Self-pay | Admitting: Obstetrics & Gynecology

## 2013-12-15 ENCOUNTER — Ambulatory Visit: Payer: Self-pay | Admitting: Obstetrics & Gynecology

## 2013-12-26 ENCOUNTER — Ambulatory Visit: Payer: BC Managed Care – PPO | Admitting: Family Medicine

## 2013-12-28 ENCOUNTER — Ambulatory Visit: Payer: BC Managed Care – PPO | Admitting: Family Medicine

## 2014-01-09 ENCOUNTER — Other Ambulatory Visit: Payer: Self-pay | Admitting: Family Medicine

## 2014-02-05 ENCOUNTER — Encounter: Payer: Self-pay | Admitting: Family Medicine

## 2014-02-05 ENCOUNTER — Other Ambulatory Visit: Payer: Self-pay | Admitting: Family Medicine

## 2014-02-05 ENCOUNTER — Ambulatory Visit (INDEPENDENT_AMBULATORY_CARE_PROVIDER_SITE_OTHER): Payer: BC Managed Care – PPO | Admitting: Family Medicine

## 2014-02-05 VITALS — BP 134/86 | Temp 98.4°F | Ht 63.0 in | Wt 190.0 lb

## 2014-02-05 DIAGNOSIS — G43909 Migraine, unspecified, not intractable, without status migrainosus: Secondary | ICD-10-CM

## 2014-02-05 DIAGNOSIS — E781 Pure hyperglyceridemia: Secondary | ICD-10-CM | POA: Insufficient documentation

## 2014-02-05 DIAGNOSIS — Z79899 Other long term (current) drug therapy: Secondary | ICD-10-CM

## 2014-02-05 DIAGNOSIS — K219 Gastro-esophageal reflux disease without esophagitis: Secondary | ICD-10-CM

## 2014-02-05 DIAGNOSIS — M549 Dorsalgia, unspecified: Secondary | ICD-10-CM

## 2014-02-05 DIAGNOSIS — K760 Fatty (change of) liver, not elsewhere classified: Secondary | ICD-10-CM

## 2014-02-05 DIAGNOSIS — K7689 Other specified diseases of liver: Secondary | ICD-10-CM

## 2014-02-05 DIAGNOSIS — E119 Type 2 diabetes mellitus without complications: Secondary | ICD-10-CM | POA: Insufficient documentation

## 2014-02-05 DIAGNOSIS — N200 Calculus of kidney: Secondary | ICD-10-CM

## 2014-02-05 LAB — POCT URINALYSIS DIPSTICK: pH, UA: 5

## 2014-02-05 MED ORDER — HYDROCODONE-ACETAMINOPHEN 5-325 MG PO TABS: 1.0000 | ORAL_TABLET | Freq: Four times a day (QID) | ORAL | Status: DC | PRN

## 2014-02-05 MED ORDER — DIAZEPAM 5 MG PO TABS: ORAL_TABLET | ORAL | Status: DC

## 2014-02-05 MED ORDER — LUBIPROSTONE 24 MCG PO CAPS: 24.0000 ug | ORAL_CAPSULE | Freq: Every day | ORAL | Status: DC

## 2014-02-05 NOTE — Progress Notes (Signed)
Subjective:    Patient ID: Lisa Li, female    DOB: 12-08-1965, 48 y.o.   MRN: [id number]  HPIDiabetic check up. Patient wants to do A1C on bloodwork because she states she will pass out doing a finger stick. She does not check blood sugar at home. Not currently taking any meds for diabetes. Metformin and invokana pt had side effects. Patient would like to try Metformin again.   Invokana - caused nausea. Metformin - caused indigestion  Kidney stone. Having back and left side pain. Started 8 days ago. Taking flomax. These symptoms are not severe. They're tolerable it stays in the left side does not radiate into the groin  Needs refill on diazepam and hydrocodone. She uses these medicines intermittently for headaches as well as for muscle spasms.  Nasal lesion - Tafeen  Family History- liver cancer in 2 cousins she has personal history of fatty liver she worries about getting liver cirrhosis as well as cancer     Review of Systems  Constitutional: Negative for activity change, appetite change and fatigue.  Respiratory: Negative for cough, choking and chest tightness.   Cardiovascular: Negative for chest pain.  Gastrointestinal: Negative for abdominal pain.  Endocrine: Negative for polydipsia and polyphagia.  Genitourinary: Negative for frequency.  Neurological: Negative for weakness.  Psychiatric/Behavioral: Negative for confusion.       Objective:   Physical Exam  Vitals reviewed. Constitutional: She appears well-nourished. No distress.  HENT:  Head: Normocephalic and atraumatic.  Cardiovascular: Normal rate, regular rhythm and normal heart sounds.   No murmur heard. Pulmonary/Chest: Effort normal and breath sounds normal. No respiratory distress.  Abdominal: Soft. There is no tenderness. There is no rebound.  Musculoskeletal: She exhibits no edema.  Lymphadenopathy:    She has no cervical adenopathy.  Neurological: She is alert. She exhibits normal muscle tone.    Skin:  Small erythematous patch on the nose that scales  Psychiatric: Her behavior is normal.          Assessment & Plan:  1. Diabetes type 2, controlled Patient's diabetes under fairly good control she does need to have lab work she does not tolerate having fingersticks done states it just causes her immense pain and she passes out with this and therefore she never checks her sugars she does try to watch her diet she is to try to lose weight  - Hemoglobin A1c - Microalbumin, urine  2. Fatty liver disease, nonalcoholic She does have fatty liver disease she knows the importance of losing weight and exercise. We will be checking a liver ultrasound along with the elastrography. I doubt she has liver cancer based on her previous test apparently there is some sort of genetic aspects of this with her family members if this proves out through the genetic testing or cousins are undergoing she will let us know - Hepatic function panel - US ABDOMEN COMPLETE W/ELASTOGRAPHY  3. Back pain She has back pain that I think is probably nephrolithiasis I don't recommend a CAT scan currently if it still persists beyond the next 7 days then we will do a scan renal stone protocol - POCT urinalysis dipstick  4. Migraine She has intermittent migraines I cautioned her against pain medications other than when necessary  5. GERD (gastroesophageal reflux disease) Under fairly good control with her medication she would prefer omeprazole 40 she feels it would do a better job  6. Hypertriglyceridemia We will check her cholesterol profile she is trying to watch starch is  in the diet  7. Nephrolithiasis See discussion above - CBC with Differential  8. Encounter for long-term (current) use of other medications She has a history of HTN we will check metabolic 7 - Basic metabolic panel  Followup again in more than likely 4 months time

## 2014-02-07 ENCOUNTER — Telehealth: Payer: Self-pay | Admitting: Neurology

## 2014-02-07 NOTE — Telephone Encounter (Signed)
Please advise. Thank you

## 2014-02-07 NOTE — Telephone Encounter (Signed)
Please let her know that we will be opening new appointments for EMGs, so it will be sooner than July.  She can follow-up with me after EMG.  Donika K. Posey Pronto, DO

## 2014-02-07 NOTE — Telephone Encounter (Signed)
Pt r/s tomorrow's EMG to 04/12/14 d/t her work schedule. She cancelled her follow up. She did not r/s, she wants to know if she should come back in for a follow up sooner than the EMG appt since it is out so far or is it OK to follow up after EMG? / Sherri S.

## 2014-02-08 ENCOUNTER — Encounter: Payer: BC Managed Care – PPO | Admitting: Neurology

## 2014-02-08 ENCOUNTER — Ambulatory Visit (HOSPITAL_COMMUNITY)
Admission: RE | Admit: 2014-02-08 | Discharge: 2014-02-08 | Disposition: A | Discharge status: Home / Self Care | Payer: BC Managed Care – PPO | Source: Ambulatory Visit | Attending: Family Medicine | Admitting: Family Medicine

## 2014-02-08 DIAGNOSIS — K7689 Other specified diseases of liver: Secondary | ICD-10-CM | POA: Insufficient documentation

## 2014-02-08 LAB — HEPATIC FUNCTION PANEL
ALBUMIN: 4.4 g/dL (ref 3.5–5.2)
ALT: 51 U/L — AB (ref 0–35)
AST: 31 U/L (ref 0–37)
Alkaline Phosphatase: 41 U/L (ref 39–117)
Bilirubin, Direct: 0.1 mg/dL (ref 0.0–0.3)
Indirect Bilirubin: 0.2 mg/dL (ref 0.2–1.2)
Total Bilirubin: 0.3 mg/dL (ref 0.2–1.2)
Total Protein: 6.8 g/dL (ref 6.0–8.3)

## 2014-02-08 LAB — HEMOGLOBIN A1C
HEMOGLOBIN A1C: 6.6 % — AB (ref <5.7)
MEAN PLASMA GLUCOSE: 143 mg/dL — AB (ref <117)

## 2014-02-08 LAB — BASIC METABOLIC PANEL
BUN: 12 mg/dL (ref 6–23)
CHLORIDE: 104 meq/L (ref 96–112)
CO2: 25 mEq/L (ref 19–32)
Calcium: 9.2 mg/dL (ref 8.4–10.5)
Creat: 0.79 mg/dL (ref 0.50–1.10)
Glucose, Bld: 133 mg/dL — ABNORMAL HIGH (ref 70–99)
POTASSIUM: 4.5 meq/L (ref 3.5–5.3)
SODIUM: 138 meq/L (ref 135–145)

## 2014-02-08 LAB — CBC WITH DIFFERENTIAL/PLATELET
BASOS PCT: 1 % (ref 0–1)
Basophils Absolute: 0.1 10*3/uL (ref 0.0–0.1)
EOS ABS: 0.2 10*3/uL (ref 0.0–0.7)
EOS PCT: 3 % (ref 0–5)
HEMATOCRIT: 39.5 % (ref 36.0–46.0)
Hemoglobin: 13.2 g/dL (ref 12.0–15.0)
Lymphocytes Relative: 37 % (ref 12–46)
Lymphs Abs: 2 10*3/uL (ref 0.7–4.0)
MCH: 26.6 pg (ref 26.0–34.0)
MCHC: 33.4 g/dL (ref 30.0–36.0)
MCV: 79.5 fL (ref 78.0–100.0)
MONO ABS: 0.4 10*3/uL (ref 0.1–1.0)
Monocytes Relative: 7 % (ref 3–12)
Neutro Abs: 2.8 10*3/uL (ref 1.7–7.7)
Neutrophils Relative %: 52 % (ref 43–77)
PLATELETS: 202 10*3/uL (ref 150–400)
RBC: 4.97 MIL/uL (ref 3.87–5.11)
RDW: 14.5 % (ref 11.5–15.5)
WBC: 5.3 10*3/uL (ref 4.0–10.5)

## 2014-02-08 LAB — MICROALBUMIN, URINE: MICROALB UR: 0.5 mg/dL (ref 0.00–1.89)

## 2014-02-16 ENCOUNTER — Ambulatory Visit: Payer: BC Managed Care – PPO | Admitting: Neurology

## 2014-02-22 ENCOUNTER — Other Ambulatory Visit: Payer: Self-pay | Admitting: Family Medicine

## 2014-02-23 ENCOUNTER — Telehealth: Payer: Self-pay | Admitting: Obstetrics & Gynecology

## 2014-02-23 NOTE — Telephone Encounter (Signed)
LMTCB re: dr cx/rs from 5/19 to 03/07/14 instead. Please confirm.

## 2014-02-26 NOTE — Telephone Encounter (Signed)
Patient Confirmed/td

## 2014-02-27 ENCOUNTER — Ambulatory Visit: Payer: BC Managed Care – PPO | Admitting: Obstetrics & Gynecology

## 2014-03-07 ENCOUNTER — Ambulatory Visit (INDEPENDENT_AMBULATORY_CARE_PROVIDER_SITE_OTHER): Payer: BC Managed Care – PPO | Admitting: Obstetrics & Gynecology

## 2014-03-07 ENCOUNTER — Encounter: Payer: Self-pay | Admitting: Obstetrics & Gynecology

## 2014-03-07 VITALS — BP 120/78 | HR 60 | Resp 16 | Ht 63.75 in | Wt 190.2 lb

## 2014-03-07 DIAGNOSIS — Z01419 Encounter for gynecological examination (general) (routine) without abnormal findings: Secondary | ICD-10-CM

## 2014-03-07 DIAGNOSIS — Z0000 Encounter for general adult medical examination without abnormal findings: Secondary | ICD-10-CM

## 2014-03-07 LAB — POCT URINALYSIS DIPSTICK
Bilirubin, UA: NEGATIVE
Blood, UA: NEGATIVE
GLUCOSE UA: 50
Ketones, UA: NEGATIVE
NITRITE UA: NEGATIVE
PROTEIN UA: NEGATIVE
UROBILINOGEN UA: NEGATIVE
pH, UA: 5

## 2014-03-07 MED ORDER — ESTRADIOL 0.075 MG/24HR TD PTTW: 1.0000 | MEDICATED_PATCH | TRANSDERMAL | Status: DC

## 2014-03-07 MED ORDER — AMOXICILLIN-POT CLAVULANATE 875-125 MG PO TABS: 1.0000 | ORAL_TABLET | Freq: Two times a day (BID) | ORAL | Status: DC

## 2014-03-07 MED ORDER — FLUCONAZOLE 150 MG PO TABS: ORAL_TABLET | ORAL | Status: DC

## 2014-03-07 NOTE — Patient Instructions (Signed)

## 2014-03-07 NOTE — Progress Notes (Signed)
48 y.o. G0P0 SingleCaucasianF here for annual exam.  Reports she is now working at [employer] in [place].  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  She is enjoying this but the drive is really hard on her.  She is continuing to look for work closer.    Frustrated that she has gained weight since her surgery.  Up 7 pounds.  Hair has been ok.  It has thinned a little.  Has borderline diabetes.  Dr. Wolfgang Phoenix is following her for this.  Has been on metformin.    Has two family members--cousins with liver cancer.  Patient had abdominal ultrasound as well as mildly elevated liver enzymes.  Had some issues with increased urgency and urinary leakage.  Had urgency that was a lot more significantly after surgery.  Has improved a lot for her.  Does sometimes leak with cough or laugh.    Complaint of sinus drainage.    Patient's last menstrual period was 05/31/2013.          Sexually active: yes  The current method of family planning is status post hysterectomy.    Exercising: no  not regularly Smoker:  no  Health Maintenance: Pap:  09/30/12 WNL/negative HR HPV History of abnormal Pap:  no MMG:  06/29/13 3D-normal Colonoscopy:  5/12-repeat in 5 years BMD:   none TDaP:  8/13  Screening Labs: today, Hb today: pending, Urine today: WBC-1+, GLUCOSE-50   reports that she has never smoked. She has never used smokeless tobacco. She reports that she does not drink alcohol or use illicit drugs.  Past Medical History  Diagnosis Date  . Migraine headache   . Myalgia     Chronic  . Frequent UTI   . Pre-diabetes   . MGUS (monoclonal gammopathy of unknown significance)   . Kidney stone     Past Surgical History  Procedure Laterality Date  . Appendectomy    . Cholecystectomy    . Tonsillectomy  1973  . Colonoscopy w/ endoscopic [place]  09/27/06    02/2011   . Esophagogastroduodenoscopy    . Robotic assisted total hysterectomy with bilateral  salpingo oopherectomy Bilateral 07/03/2013    Procedure: ROBOTIC ASSISTED TOTAL HYSTERECTOMY WITH BILATERAL SALPINGO OOPHORECTOMY;  Surgeon: Lyman Speller, MD;  Location: [facility];  Service: Gynecology;  Laterality: Bilateral;  . Cystoscopy N/A 07/03/2013    Procedure: CYSTOSCOPY;  Surgeon: Lyman Speller, MD;  Location: [facility];  Service: Gynecology;  Laterality: N/A;    Current Outpatient Prescriptions  Medication Sig Dispense Refill  . AMITIZA 24 MCG capsule TAKE 1 CAPSULE BY MOUTH TWICE DAILY.  60 capsule  2  . diazepam (VALIUM) 5 MG tablet TAKE ONE TABLET BY MOUTH AT BEDTIME AS NEEDED.  30 tablet  4  . docusate sodium (COLACE) 100 MG capsule Take 100 mg by mouth as needed.       Marland Kitchen estradiol (VIVELLE-DOT) 0.0375 MG/24HR Place 1 patch onto the skin 2 (two) times a week.  24 patch  4  . FLUoxetine (PROZAC) 10 MG tablet Take 10 mg by mouth daily. Pt. Sometimes takes 5mg .      . HYDROcodone-acetaminophen (NORCO/VICODIN) 5-325 MG per tablet Take 1 tablet by mouth every 6 (six) hours as needed.  40 tablet  0  . lansoprazole (PREVACID) 15 MG capsule Take 15-30 mg by mouth daily.      Marland Kitchen loratadine (CLARITIN) 10 MG tablet Take 10 mg by mouth daily.      Marland Kitchen  meloxicam (MOBIC) 15 MG tablet Take 15 mg by mouth daily.      . naproxen (NAPROSYN) 500 MG tablet TAKE ONE TABLET BY MOUTH TWICE DAILY AS NEEDED FOR HEADACHE. STOP USING MELOXICAM WHILE TAKING THIS MEDICATION.  60 tablet  0  . propranolol (INDERAL) 10 MG tablet Take 2 tablets (20 mg total) by mouth 2 (two) times daily as needed.  60 tablet  12  . triamcinolone (NASACORT) 55 MCG/ACT nasal inhaler Place 2 sprays into the nose daily.  1 Inhaler  12  . Tamsulosin HCl (FLOMAX PO) Take by mouth.       No current facility-administered medications for this visit.    Family History  Problem Relation Age of Onset  . Prostate cancer Father     Deceased, 69  . COPD Mother     Deceased, 82  . Emphysema Sister   . Healthy Brother   . Liver  cancer Other     and pancreatic cancer-paternal cousin  . Liver cancer Other     paternal cousin    ROS:  Pertinent items are noted in HPI.  Otherwise, a comprehensive ROS was negative.  Exam:   BP 120/78  Pulse 60  Resp 16  Ht 5' 3.75" (1.619 m)  Wt 190 lb 3.2 oz (86.274 kg)  BMI 32.91 kg/m2  LMP 05/31/2013  Weight change: -7#  Height: 5' 3.75" (161.9 cm)  Ht Readings from Last 3 Encounters:  03/07/14 5' 3.75" (1.619 m)  02/05/14 5\' 3"  (1.6 m)  10/03/13 5\' 3"  (1.6 m)    General appearance: alert, cooperative and appears stated age Head: Normocephalic, without obvious abnormality, atraumatic Neck: no adenopathy, supple, symmetrical, trachea midline and thyroid normal to inspection and palpation Lungs: clear to auscultation bilaterally Breasts: normal appearance, no masses or tenderness Heart: regular rate and rhythm Abdomen: soft, non-tender; bowel sounds normal; no masses,  no organomegaly Extremities: extremities normal, atraumatic, no cyanosis or edema Skin: Skin color, texture, turgor normal. No rashes or lesions Lymph nodes: Cervical, supraclavicular, and axillary nodes normal. No abnormal inguinal nodes palpated Neurologic: Grossly normal   Pelvic: External genitalia:  no lesions              Urethra:  normal appearing urethra with no masses, tenderness or lesions              Bartholins and Skenes: normal                 Vagina: normal appearing vagina with normal color and discharge, no lesions              Cervix: absent              Pap taken: no Bimanual Exam:  Uterus:  uterus absent              Adnexa: normal adnexa and no mass, fullness, tenderness               Rectovaginal: Confirms               Anus:  normal sphincter tone, no lesions  A:  Well Woman with normal exam Pre-diabetes Migraines GERD, gastric polyps, IBS, fatty liver disease Sinusitis  P:   Mammogram yearly pap smear not indicated Vivelle dot 0.075mg  patches twice weekly.  90 day  supply/4RF Augmentin 875mg  bid x 7 days.  Diflucan 150mg  po x 1 repeat 48-72 hours return annually or prn  An After Visit Summary was printed and given to the  patient.

## 2014-03-09 ENCOUNTER — Other Ambulatory Visit: Payer: Self-pay | Admitting: *Deleted

## 2014-03-09 DIAGNOSIS — R209 Unspecified disturbances of skin sensation: Secondary | ICD-10-CM

## 2014-04-09 ENCOUNTER — Ambulatory Visit (INDEPENDENT_AMBULATORY_CARE_PROVIDER_SITE_OTHER): Payer: BC Managed Care – PPO | Admitting: Family Medicine

## 2014-04-09 ENCOUNTER — Other Ambulatory Visit: Payer: Self-pay | Admitting: Family Medicine

## 2014-04-09 ENCOUNTER — Telehealth: Payer: Self-pay | Admitting: Family Medicine

## 2014-04-09 ENCOUNTER — Encounter: Payer: Self-pay | Admitting: Family Medicine

## 2014-04-09 VITALS — BP 124/88 | HR 76 | Temp 98.3°F | Ht 63.0 in | Wt 194.0 lb

## 2014-04-09 DIAGNOSIS — K760 Fatty (change of) liver, not elsewhere classified: Secondary | ICD-10-CM

## 2014-04-09 DIAGNOSIS — J019 Acute sinusitis, unspecified: Secondary | ICD-10-CM

## 2014-04-09 DIAGNOSIS — K7689 Other specified diseases of liver: Secondary | ICD-10-CM

## 2014-04-09 DIAGNOSIS — Z1159 Encounter for screening for other viral diseases: Secondary | ICD-10-CM

## 2014-04-09 DIAGNOSIS — Z0000 Encounter for general adult medical examination without abnormal findings: Secondary | ICD-10-CM

## 2014-04-09 MED ORDER — HYDROCOD POLST-CHLORPHEN POLST 10-8 MG/5ML PO LQCR: 5.0000 mL | Freq: Every evening | ORAL | Status: DC | PRN

## 2014-04-09 MED ORDER — METFORMIN HCL ER 500 MG PO TB24: 500.0000 mg | ORAL_TABLET | Freq: Every day | ORAL | Status: DC

## 2014-04-09 MED ORDER — CIPROFLOXACIN HCL 500 MG PO TABS: 500.0000 mg | ORAL_TABLET | Freq: Two times a day (BID) | ORAL | Status: DC

## 2014-04-09 MED ORDER — GABAPENTIN 100 MG PO CAPS: 100.0000 mg | ORAL_CAPSULE | Freq: Two times a day (BID) | ORAL | Status: DC

## 2014-04-09 MED ORDER — CIPROFLOXACIN HCL 500 MG PO TABS
500.0000 mg | ORAL_TABLET | Freq: Two times a day (BID) | ORAL | Status: DC
Start: 2014-04-09 — End: 2014-10-03

## 2014-04-09 NOTE — Telephone Encounter (Signed)
Patient said that Lisa Li does not have the Rx for cipro. She would like this resent in.

## 2014-04-09 NOTE — Telephone Encounter (Signed)
Left message on voicemail notifying patient cipro was resent in to pharmacy.

## 2014-04-09 NOTE — Progress Notes (Signed)
   Subjective:    Patient ID: Lisa Li, female    DOB: October 19, 1965, 48 y.o.   MRN: [id number]  HPI Patient is here today for a school physical.   She needs the formed filled out for [employer].  She does have fatty liver. Liver enzymes are elevated on last blood draw. She is trying to do better with watching her diet try and do better with exercising trying to keep her weight down. She hopes that she can get a job closer she will have less time communicating more time to the exercise. It is not quite time to check her A1c. We talked about metformin 1 she tried it did cause intestinal problems but she is open to trying it again we will go ahead and restart it. I reviewed overall of her immunizations. We will need to do a hepatitis B antibody titer for her workplace.   Review of Systems  Constitutional: Negative for fever, activity change, appetite change and fatigue.  HENT: Positive for congestion and rhinorrhea. Negative for ear pain.   Eyes: Negative for discharge.  Respiratory: Negative for cough, choking, shortness of breath and wheezing.   Cardiovascular: Negative for chest pain.  Gastrointestinal: Negative for abdominal pain.  Endocrine: Negative for polydipsia and polyphagia.  Genitourinary: Negative for frequency.  Neurological: Negative for weakness.  Psychiatric/Behavioral: Negative for confusion.       Objective:   Physical Exam  Nursing note and vitals reviewed. Constitutional: She appears well-developed and well-nourished. No distress.  HENT:  Head: Normocephalic.  Nose: Nose normal.  Mouth/Throat: Oropharynx is clear and moist. No oropharyngeal exudate.  Neck: Neck supple.  Cardiovascular: Normal rate, regular rhythm and normal heart sounds.   No murmur heard. Pulmonary/Chest: Effort normal and breath sounds normal. No respiratory distress. She has no wheezes.  Abdominal: Soft. There is no tenderness.  Musculoskeletal: She exhibits no edema.  Lymphadenopathy:     She has no cervical adenopathy.  Neurological: She is alert. She exhibits normal muscle tone.  Skin: Skin is warm and dry.  Psychiatric: Her behavior is normal.          Assessment & Plan:  Wellness-safety measures dietary measures all discussed. School form was filled out.  Diabetes-duration patient has prediabetes. Under fairly decent control. Watch diet closely. Also recommend metformin XR 500 mg one daily if it bothers her stomach to let us know  The liver-patient had elevated liver enzymes has fatty liver as evidenced by her CAT scan a few years ago. Patient was having increased liver enzyme issues and there was concerned about the possibility of developing fibrosis/cirrhosis of the liver. Liver ultrasound was ordered and was medically necessary in order to rule out progression toward cirrhosis. Her insurance company denied coverage of this which was medically necessary

## 2014-04-10 ENCOUNTER — Encounter: Payer: Self-pay | Admitting: Family Medicine

## 2014-04-10 LAB — HEPATITIS B SURFACE ANTIGEN: HEP B S AG: NEGATIVE

## 2014-04-10 LAB — HEPATITIS B SURFACE ANTIBODY, QUANTITATIVE: Hepatitis B-Post: 0 m[IU]/mL

## 2014-04-12 ENCOUNTER — Ambulatory Visit (INDEPENDENT_AMBULATORY_CARE_PROVIDER_SITE_OTHER): Payer: BC Managed Care – PPO | Admitting: Neurology

## 2014-04-12 DIAGNOSIS — R209 Unspecified disturbances of skin sensation: Secondary | ICD-10-CM

## 2014-04-12 NOTE — Procedures (Signed)
[facility]  [place], [place]  [place], [place] [address] Tel: [phone] Fax:  [phone] Test Date:  04/12/2014  Patient: Lisa Li DOB: 07/23/1966 Physician: Narda Amber, DO  Sex: Female Height: 5\' 3"  Ref Phys: Narda Amber  ID#: [id number] Temp: 35.0C Technician: Laureen Ochs R. NCS T.   Patient Complaints: Patient is a 48 year old female with bilateral arm paresthesias right greater than left for 7-8 years with recent exacerbation here to have evaluation for these symptoms.  NCV & EMG Findings: Extensive electrodiagnostic testing of the right upper extremity and additional studies of the left reveals:  1. The right median sensory nerve showed prolonged distal peak latency (4.5 ms) and reduced amplitude (15.7 V).  Palmar studies on the left side are borderline abnormal. Normal bilateral ulnar and right radial sensory responses.  2. Evaluation of the right median motor nerve showed prolonged distal onset latency (6.5 ms).  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 The left median and bilateral ulnar motor responses are within normal limits.  3. There is no evidence of active or chronic motor axon loss changes affecting any of the tested muscles.   Impression: 1. Right median neuropathy at or distal to the wrist, consistent with the clinical diagnosis of carpal tunnel syndrome. Overall, these findings are moderate-to-severe in degree electrically. 2. Left median neuropathy at or distal to the wrist, consistent with the clinical diagnosis of carpal tunnel syndrome. Overall these findings are very mild in degree electrically. 3. Incidentally, there is a right Martin-Gruber anastomosis, which is a normal variant.   ___________________________ Narda Amber, DO    Nerve Conduction Studies Anti Sensory Summary Table   Site NR Peak (ms)  Norm Peak (ms) P-T Amp (V) Norm P-T Amp  Left Median Anti Sensory (2nd Digit)  35C  Wrist    3.1 <3.4 58.1 >20  Right Median Anti Sensory (2nd Digit)  Wrist    4.5 <3.4 15.7 >20  Right Radial Anti Sensory (Base 1st Digit)  Wrist    2.0 <2.7 43.5 >18  Left Ulnar Anti Sensory (5th Digit)  35C  Wrist    2.6 <3.1 28.5 >12  Right Ulnar Anti Sensory (5th Digit)  Wrist    2.5 <3.1 47.7 >12   Motor Summary Table   Site NR Onset (ms) Norm Onset (ms) O-P Amp (mV) Norm O-P Amp Site1 Site2 Delta-0 (ms) Dist (cm) Vel (m/s) Norm Vel (m/s)  Left Median Motor (Abd Poll Brev)  35C  Wrist    3.3 <3.9 9.7 >6 Elbow Wrist 4.1 21.5 52 >50  Elbow    7.4  9.4         Right Median Motor (Abd Poll Brev)  35C  Wrist    6.5 <3.9 10.3 >6 Elbow Wrist 4.4 24.0 55 >50  Elbow    10.9  9.8  Ulnar-wrist Elbow 7.3 0.0    Ulnar-wrist    3.6  2.5         Left Ulnar Motor (Abd Dig Minimi)  35C  Wrist    1.8 <3.1 8.2 >7 B Elbow Wrist 3.7 20.0 54 >50  B Elbow    5.5  7.9  A Elbow B Elbow 2.0 10.0 50 >50  A Elbow    7.5  7.9         Right Ulnar Motor (Abd Dig Minimi)  35C  Wrist    2.0 <3.1 9.9 >7 B Elbow Wrist 3.5 20.0 57 >50  B Elbow    5.5  9.9  A Elbow B Elbow 1.7 10.0 59 >50  A Elbow    7.2  9.7          Comparison Summary Table   Site NR Peak (ms) Norm Peak (ms) P-T Amp (V) Site1 Site2 Delta-P (ms) Norm Delta (ms)  Left Median/Ulnar Palm Comparison (Wrist - 8cm)  35C  Median Palm    2.1 <2.2 64.3 Median Palm Ulnar Palm 0.4   Ulnar Palm    1.7 <2.2 17.5       F Wave Studies   NR F-Lat (ms) Lat Norm (ms) L-R F-Lat (ms)  Right Ulnar (Mrkrs) (Abd Dig Min)  35C     25.75 <33    EMG   Side Muscle Ins Act Fibs Psw Fasc Number Recrt Dur Dur. Amp Amp. Poly Poly. Comment  Right 1stDorInt Nml Nml Nml Nml Nml Nml Nml Nml Nml Nml Nml Nml N/A  Right Abd Poll Brev Nml Nml Nml Nml Nml Nml Nml Nml Nml Nml Nml Nml N/A  Right PronatorTeres Nml Nml Nml Nml Nml Nml Nml Nml Nml Nml Nml Nml N/A  Right Biceps Nml  Nml Nml Nml Nml Nml Nml Nml Nml Nml Nml Nml N/A  Right Triceps Nml Nml Nml Nml Nml Nml Nml Nml Nml Nml Nml Nml N/A  Right Deltoid Nml Nml Nml Nml Nml Nml Nml Nml Nml Nml Nml Nml N/A      Waveforms:

## 2014-05-08 ENCOUNTER — Ambulatory Visit: Payer: BC Managed Care – PPO | Admitting: Family Medicine

## 2014-05-25 ENCOUNTER — Telehealth: Payer: Self-pay | Admitting: Family Medicine

## 2014-05-25 DIAGNOSIS — E119 Type 2 diabetes mellitus without complications: Secondary | ICD-10-CM

## 2014-05-25 NOTE — Telephone Encounter (Signed)
On 02/08/14 had: CBC, Liver, Met 7, HgbA1C, urine micro

## 2014-05-25 NOTE — Telephone Encounter (Signed)
BW orders, Pt has diabetic check on 06/19/14  Please call pt when sent  She would like to know if she needs to fast please  Let her know when you call

## 2014-05-28 NOTE — Telephone Encounter (Signed)
Hemoglobin A1c, lipid profile with followup office visit

## 2014-05-28 NOTE — Telephone Encounter (Signed)
Blood work orders placed in Epic. Patient notified. 

## 2014-05-31 ENCOUNTER — Ambulatory Visit: Payer: BC Managed Care – PPO | Admitting: Neurology

## 2014-06-13 LAB — LIPID PANEL
CHOL/HDL RATIO: 4.9 ratio
Cholesterol: 190 mg/dL (ref 0–200)
HDL: 39 mg/dL — ABNORMAL LOW (ref >39)
LDL Cholesterol: 105 mg/dL — ABNORMAL HIGH (ref 0–99)
Triglycerides: 231 mg/dL — ABNORMAL HIGH (ref <150)
VLDL: 46 mg/dL — ABNORMAL HIGH (ref 0–40)

## 2014-06-13 LAB — HEMOGLOBIN A1C
HEMOGLOBIN A1C: 7 % — AB (ref <5.7)
MEAN PLASMA GLUCOSE: 154 mg/dL — AB (ref <117)

## 2014-06-19 ENCOUNTER — Ambulatory Visit (INDEPENDENT_AMBULATORY_CARE_PROVIDER_SITE_OTHER): Payer: BC Managed Care – PPO | Admitting: Family Medicine

## 2014-06-19 ENCOUNTER — Encounter: Payer: Self-pay | Admitting: Family Medicine

## 2014-06-19 VITALS — BP 128/80 | Ht 63.0 in | Wt 192.4 lb

## 2014-06-19 DIAGNOSIS — E7849 Other hyperlipidemia: Secondary | ICD-10-CM | POA: Insufficient documentation

## 2014-06-19 DIAGNOSIS — E785 Hyperlipidemia, unspecified: Secondary | ICD-10-CM

## 2014-06-19 DIAGNOSIS — E119 Type 2 diabetes mellitus without complications: Secondary | ICD-10-CM

## 2014-06-19 DIAGNOSIS — Z23 Encounter for immunization: Secondary | ICD-10-CM

## 2014-06-19 DIAGNOSIS — E781 Pure hyperglyceridemia: Secondary | ICD-10-CM

## 2014-06-19 MED ORDER — PRAVASTATIN SODIUM 20 MG PO TABS: 20.0000 mg | ORAL_TABLET | Freq: Every day | ORAL | Status: DC

## 2014-06-19 MED ORDER — HYDROCODONE-ACETAMINOPHEN 5-325 MG PO TABS: 1.0000 | ORAL_TABLET | Freq: Four times a day (QID) | ORAL | Status: DC | PRN

## 2014-06-19 MED ORDER — NAPROXEN 500 MG PO TABS: ORAL_TABLET | ORAL | Status: DC

## 2014-06-19 MED ORDER — LIDOCAINE 5 % EX OINT: 1.0000 "application " | TOPICAL_OINTMENT | CUTANEOUS | Status: DC | PRN

## 2014-06-19 NOTE — Progress Notes (Signed)
   Subjective:    Patient ID: Lisa Li, female    DOB: 1966-06-04, 48 y.o.   MRN: [id number]  Diabetes She presents for her follow-up diabetic visit. She has type 2 diabetes mellitus. There are no hypoglycemic associated symptoms. Pertinent negatives for hypoglycemia include no confusion. Pertinent negatives for diabetes include no chest pain, no fatigue, no polydipsia, no polyphagia and no weakness. There are no hypoglycemic complications. Risk factors for coronary artery disease include diabetes mellitus, dyslipidemia and hypertension. Current diabetic treatment includes oral agent (monotherapy). She is compliant with treatment all of the time. She is following a diabetic diet. She participates in exercise intermittently. She does not see a podiatrist.Eye exam is not current.   Discuss recent la results    Review of Systems  Constitutional: Negative for activity change, appetite change and fatigue.  Respiratory: Negative for shortness of breath.   Cardiovascular: Negative for chest pain.  Gastrointestinal: Negative for abdominal pain.  Endocrine: Negative for polydipsia and polyphagia.  Genitourinary: Negative for frequency.  Neurological: Negative for weakness.  Psychiatric/Behavioral: Negative for confusion.       Objective:   Physical Exam  Vitals reviewed. Constitutional: She appears well-nourished. No distress.  Cardiovascular: Normal rate, regular rhythm and normal heart sounds.   No murmur heard. Pulmonary/Chest: Effort normal and breath sounds normal. No respiratory distress.  Musculoskeletal: She exhibits no edema.  Lymphadenopathy:    She has no cervical adenopathy.  Neurological: She is alert. She exhibits normal muscle tone.  Psychiatric: Her behavior is normal.   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

## 2014-07-23 ENCOUNTER — Ambulatory Visit: Payer: BC Managed Care – PPO | Admitting: Family Medicine

## 2014-07-24 ENCOUNTER — Telehealth: Payer: Self-pay | Admitting: Neurology

## 2014-07-24 ENCOUNTER — Ambulatory Visit: Payer: BC Managed Care – PPO | Admitting: Neurology

## 2014-07-24 NOTE — Telephone Encounter (Signed)
Noted  

## 2014-07-24 NOTE — Telephone Encounter (Signed)
Pt called this morning at 8:39AM stating that she is sick and will not be able to  Make her f/u appt for today 07/24/14.

## 2014-07-25 ENCOUNTER — Telehealth: Payer: Self-pay | Admitting: Neurology

## 2014-07-25 NOTE — Telephone Encounter (Signed)
Pt no showed 07/24/14 appt w/ Dr. Posey Pronto.   Lisa Li - please send no show letter to pt / Sherri S.

## 2014-07-26 ENCOUNTER — Encounter: Payer: Self-pay | Admitting: *Deleted

## 2014-07-26 NOTE — Progress Notes (Signed)
No show letter sent for 07/24/2014

## 2014-08-02 ENCOUNTER — Other Ambulatory Visit: Payer: Self-pay | Admitting: Family Medicine

## 2014-09-14 ENCOUNTER — Telehealth: Payer: Self-pay | Admitting: Family Medicine

## 2014-09-14 ENCOUNTER — Other Ambulatory Visit: Payer: Self-pay

## 2014-09-14 MED ORDER — PROPRANOLOL HCL 10 MG PO TABS: 20.0000 mg | ORAL_TABLET | Freq: Two times a day (BID) | ORAL | Status: DC | PRN

## 2014-09-14 NOTE — Telephone Encounter (Signed)
Pt states she needs a refill on her BP pills she runs out today  Please ensure this is sent today is the request of the patient   propranolol (INDERAL) 10 MG tablet    [facility]

## 2014-09-14 NOTE — Telephone Encounter (Signed)
May send in 12 monthas on thios

## 2014-09-14 NOTE — Telephone Encounter (Signed)
RX has already been sent to pharmacy. Propranolol sent 09/14/14 #60 X 11 to [facility] closed

## 2014-09-14 NOTE — Telephone Encounter (Signed)
Medication sent to pharmacy. Left message on voicemail notifying patient.  

## 2014-09-14 NOTE — Telephone Encounter (Signed)
Last prescribed by another doctor per Epic.

## 2014-10-01 ENCOUNTER — Ambulatory Visit: Payer: BC Managed Care – PPO | Admitting: Family Medicine

## 2014-10-03 ENCOUNTER — Ambulatory Visit (INDEPENDENT_AMBULATORY_CARE_PROVIDER_SITE_OTHER): Payer: BC Managed Care – PPO | Admitting: Family Medicine

## 2014-10-03 VITALS — BP 130/82 | Ht 63.0 in | Wt 192.6 lb

## 2014-10-03 DIAGNOSIS — R300 Dysuria: Secondary | ICD-10-CM

## 2014-10-03 DIAGNOSIS — E119 Type 2 diabetes mellitus without complications: Secondary | ICD-10-CM

## 2014-10-03 DIAGNOSIS — G43019 Migraine without aura, intractable, without status migrainosus: Secondary | ICD-10-CM

## 2014-10-03 DIAGNOSIS — K760 Fatty (change of) liver, not elsewhere classified: Secondary | ICD-10-CM

## 2014-10-03 DIAGNOSIS — E785 Hyperlipidemia, unspecified: Secondary | ICD-10-CM

## 2014-10-03 DIAGNOSIS — R070 Pain in throat: Secondary | ICD-10-CM

## 2014-10-03 LAB — POCT URINALYSIS DIPSTICK
PH UA: 6
SPEC GRAV UA: 1.02

## 2014-10-03 MED ORDER — LIDOCAINE HCL 2 % EX GEL: 1.0000 "application " | CUTANEOUS | Status: DC | PRN

## 2014-10-03 MED ORDER — LUBIPROSTONE 24 MCG PO CAPS: 24.0000 ug | ORAL_CAPSULE | Freq: Two times a day (BID) | ORAL | Status: DC

## 2014-10-03 MED ORDER — HYDROCODONE-ACETAMINOPHEN 5-325 MG PO TABS: 1.0000 | ORAL_TABLET | Freq: Four times a day (QID) | ORAL | Status: DC | PRN

## 2014-10-03 MED ORDER — FLUOXETINE HCL 10 MG PO TABS: 10.0000 mg | ORAL_TABLET | Freq: Every day | ORAL | Status: DC

## 2014-10-03 MED ORDER — OMEPRAZOLE 40 MG PO CPDR: 40.0000 mg | DELAYED_RELEASE_CAPSULE | Freq: Every day | ORAL | Status: DC

## 2014-10-03 NOTE — Progress Notes (Signed)
   Subjective:    Patient ID: Lisa Li, female    DOB: 11-22-1965, 48 y.o.   MRN: [id number]  Diabetes She presents for her follow-up diabetic visit. She has type 2 diabetes mellitus. Current diabetic treatment includes oral agent (monotherapy). She is compliant with treatment all of the time. She is following a diabetic diet. She has not had a previous visit with a dietitian. She participates in exercise intermittently. She does not see a podiatrist.Eye exam is not current.   Burning with urination   Review of Systems    patient denies chest pain shortness breath nausea vomiting relates intermittent abdominal pain no bloody stools Objective:   Physical Exam Lungs clear heart regular abdomen soft subjected discomfort midabdomen no guarding or rebound no hernias are felt extremities no edema diabetic foot exam normal       Assessment & Plan:  Abdominal discomfort hard to tell could be gastritis increase omeprazole 40 mg daily if not significantly better within the next 2 weeks notify us and we will refer to gastroenterology  Diabetes patient refuses A1c she would prefer venous A1c because of less pain lipid profile along with A1c ordered await the results may need adjustments in medications  Patient uses Prozac currently she was wondering if she needs to stop this I would recommend she wait until spring time  Frequent headaches-hydrocodone she states 60 tablets would last her 3 months. She currently takes Inderal. This helps keep her headaches under control. She cannot tolerate Topamax.  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

## 2014-10-10 LAB — BASIC METABOLIC PANEL
BUN: 14 mg/dL (ref 6–23)
CO2: 26 meq/L (ref 19–32)
Calcium: 9.3 mg/dL (ref 8.4–10.5)
Chloride: 100 mEq/L (ref 96–112)
Creat: 0.83 mg/dL (ref 0.50–1.10)
GLUCOSE: 125 mg/dL — AB (ref 70–99)
POTASSIUM: 4.3 meq/L (ref 3.5–5.3)
SODIUM: 138 meq/L (ref 135–145)

## 2014-10-10 LAB — HEMOGLOBIN A1C
Hgb A1c MFr Bld: 6.9 % — ABNORMAL HIGH (ref <5.7)
Mean Plasma Glucose: 151 mg/dL — ABNORMAL HIGH (ref <117)

## 2014-10-10 LAB — LIPID PANEL
CHOL/HDL RATIO: 5.3 ratio
CHOLESTEROL: 208 mg/dL — AB (ref 0–200)
HDL: 39 mg/dL — AB (ref >39)
LDL Cholesterol: 128 mg/dL — ABNORMAL HIGH (ref 0–99)
TRIGLYCERIDES: 203 mg/dL — AB (ref <150)
VLDL: 41 mg/dL — ABNORMAL HIGH (ref 0–40)

## 2014-10-10 LAB — MICROALBUMIN, URINE: Microalb, Ur: 0.5 mg/dL (ref <2.0)

## 2014-10-18 MED ORDER — ATORVASTATIN CALCIUM 10 MG PO TABS: 10.0000 mg | ORAL_TABLET | Freq: Every day | ORAL | Status: DC

## 2014-10-18 NOTE — Addendum Note (Signed)
Addended byCharolotte Capuchin D on: 10/18/2014 05:25 PM   Modules accepted: Orders

## 2014-11-12 ENCOUNTER — Other Ambulatory Visit: Payer: Self-pay | Admitting: Family Medicine

## 2014-12-19 ENCOUNTER — Other Ambulatory Visit: Payer: Self-pay | Admitting: Family Medicine

## 2014-12-26 ENCOUNTER — Other Ambulatory Visit: Payer: Self-pay | Admitting: Family Medicine

## 2014-12-26 NOTE — Telephone Encounter (Signed)
Last seen Dec 2015

## 2014-12-28 NOTE — Telephone Encounter (Signed)
This +4 additional refills

## 2015-01-15 ENCOUNTER — Other Ambulatory Visit: Payer: Self-pay | Admitting: Family Medicine

## 2015-02-06 ENCOUNTER — Telehealth: Payer: Self-pay | Admitting: Family Medicine

## 2015-02-06 DIAGNOSIS — E785 Hyperlipidemia, unspecified: Secondary | ICD-10-CM

## 2015-02-06 DIAGNOSIS — E119 Type 2 diabetes mellitus without complications: Secondary | ICD-10-CM

## 2015-02-06 DIAGNOSIS — Z79899 Other long term (current) drug therapy: Secondary | ICD-10-CM

## 2015-02-06 NOTE — Telephone Encounter (Signed)
bw ready pt notified.

## 2015-02-06 NOTE — Telephone Encounter (Signed)
Pt has labs in but need to be sent to [facility], wants you to add A1C to the orders please   Call when sent

## 2015-02-16 LAB — HEPATIC FUNCTION PANEL
ALT: 42 IU/L — AB (ref 0–32)
AST: 28 IU/L (ref 0–40)
Albumin: 4.6 g/dL (ref 3.5–5.5)
Alkaline Phosphatase: 48 IU/L (ref 39–117)
BILIRUBIN TOTAL: 0.4 mg/dL (ref 0.0–1.2)
Bilirubin, Direct: 0.12 mg/dL (ref 0.00–0.40)
Total Protein: 6.8 g/dL (ref 6.0–8.5)

## 2015-02-16 LAB — LIPID PANEL
Chol/HDL Ratio: 5.1 ratio units — ABNORMAL HIGH (ref 0.0–4.4)
Cholesterol, Total: 195 mg/dL (ref 100–199)
HDL: 38 mg/dL — ABNORMAL LOW (ref >39)
LDL Calculated: 101 mg/dL — ABNORMAL HIGH (ref 0–99)
Triglycerides: 279 mg/dL — ABNORMAL HIGH (ref 0–149)
VLDL Cholesterol Cal: 56 mg/dL — ABNORMAL HIGH (ref 5–40)

## 2015-02-16 LAB — HEMOGLOBIN A1C
Est. average glucose Bld gHb Est-mCnc: 143 mg/dL
HEMOGLOBIN A1C: 6.6 % — AB (ref 4.8–5.6)

## 2015-02-26 ENCOUNTER — Other Ambulatory Visit: Payer: Self-pay | Admitting: Family Medicine

## 2015-02-26 ENCOUNTER — Ambulatory Visit (INDEPENDENT_AMBULATORY_CARE_PROVIDER_SITE_OTHER): Payer: BLUE CROSS/BLUE SHIELD | Admitting: Family Medicine

## 2015-02-26 ENCOUNTER — Encounter: Payer: Self-pay | Admitting: Family Medicine

## 2015-02-26 VITALS — BP 124/86 | Temp 98.6°F | Ht 63.0 in | Wt 189.0 lb

## 2015-02-26 DIAGNOSIS — E785 Hyperlipidemia, unspecified: Secondary | ICD-10-CM

## 2015-02-26 DIAGNOSIS — E119 Type 2 diabetes mellitus without complications: Secondary | ICD-10-CM

## 2015-02-26 DIAGNOSIS — K760 Fatty (change of) liver, not elsewhere classified: Secondary | ICD-10-CM

## 2015-02-26 DIAGNOSIS — J019 Acute sinusitis, unspecified: Secondary | ICD-10-CM | POA: Diagnosis not present

## 2015-02-26 DIAGNOSIS — B9689 Other specified bacterial agents as the cause of diseases classified elsewhere: Secondary | ICD-10-CM

## 2015-02-26 MED ORDER — HYDROCODONE-ACETAMINOPHEN 5-325 MG PO TABS: 1.0000 | ORAL_TABLET | Freq: Four times a day (QID) | ORAL | Status: DC | PRN

## 2015-02-26 MED ORDER — METFORMIN HCL ER 500 MG PO TB24: ORAL_TABLET | ORAL | Status: DC

## 2015-02-26 MED ORDER — MELOXICAM 15 MG PO TABS: 15.0000 mg | ORAL_TABLET | Freq: Every day | ORAL | Status: DC

## 2015-02-26 MED ORDER — NAPROXEN 500 MG PO TABS: ORAL_TABLET | ORAL | Status: DC

## 2015-02-26 MED ORDER — CEFPROZIL 500 MG PO TABS: 500.0000 mg | ORAL_TABLET | Freq: Two times a day (BID) | ORAL | Status: DC

## 2015-02-26 NOTE — Progress Notes (Signed)
   Subjective:    Patient ID: Lisa Li, female    DOB: Apr 10, 1966, 49 y.o.   MRN: [id number]  Diabetes She presents for her follow-up diabetic visit. She has type 2 diabetes mellitus. Pertinent negatives for hypoglycemia include no confusion. Pertinent negatives for diabetes include no chest pain, no fatigue, no polydipsia, no polyphagia and no weakness. Current diabetic treatments: Metformin. She participates in exercise intermittently. She does not see a podiatrist.Eye exam is not current.    Patient is in for a follow up for Diabetes. Patient has concerns of possible sinusitis. Symptoms of productive cough with yellow sputum, sneezing, and sinus pressure. Patient needs refill on Hydrocodone, naproxen, meloxicam.  See below was well.  Review of Systems  Constitutional: Negative for activity change, appetite change and fatigue.  HENT: Negative for congestion.   Respiratory: Negative for cough.   Cardiovascular: Negative for chest pain.  Gastrointestinal: Negative for abdominal pain.  Endocrine: Negative for polydipsia and polyphagia.  Neurological: Negative for weakness.  Psychiatric/Behavioral: Negative for confusion.       Objective:   Physical Exam  Constitutional: She appears well-nourished. No distress.  Cardiovascular: Normal rate, regular rhythm and normal heart sounds.   No murmur heard. Pulmonary/Chest: Effort normal and breath sounds normal. No respiratory distress.  Musculoskeletal: She exhibits no edema.  Lymphadenopathy:    She has no cervical adenopathy.  Neurological: She is alert. She exhibits normal muscle tone.  Psychiatric: Her behavior is normal.  Vitals reviewed.         Assessment & Plan:  Hyperlipidemia she cannot tolerate statins she will try red rice yeast extract she will let us know how that is going she will do lab work in the fall  Diabetes decent control she will continue current measures watch her diet she'll exercise more try to lose  weight in addition to this she will recheck a hemoglobin A1c with her lab work in the fall  Patient will mild sinusitis treated with antibiotics warning signs discussed follow-up if problems  Chronic low back pain hydrocodone prescription given she does not use this on a regular basis currently right now we are not doing urine drug screens or pain contract with this patient  History of fatty liver she is going to work hard on losing weight.

## 2015-02-26 NOTE — Patient Instructions (Signed)
Diabetes Mellitus and Food It is important for you to manage your blood sugar (glucose) level. Your blood glucose level can be greatly affected by what you eat. Eating healthier foods in the appropriate amounts throughout the day at about the same time each day will help you control your blood glucose level. It can also help slow or prevent worsening of your diabetes mellitus. Healthy eating may even help you improve the level of your blood pressure and reach or maintain a healthy weight.  HOW CAN FOOD AFFECT ME? Carbohydrates Carbohydrates affect your blood glucose level more than any other type of food. Your dietitian will help you determine how many carbohydrates to eat at each meal and teach you how to count carbohydrates. Counting carbohydrates is important to keep your blood glucose at a healthy level, especially if you are using insulin or taking certain medicines for diabetes mellitus. Alcohol Alcohol can cause sudden decreases in blood glucose (hypoglycemia), especially if you use insulin or take certain medicines for diabetes mellitus. Hypoglycemia can be a life-threatening condition. Symptoms of hypoglycemia (sleepiness, dizziness, and disorientation) are similar to symptoms of having too much alcohol.  If your health care provider has given you approval to drink alcohol, do so in moderation and use the following guidelines:  Women should not have more than one drink per day, and men should not have more than two drinks per day. One drink is equal to:  12 oz of beer.  5 oz of wine.  1 oz of hard liquor.  Do not drink on an empty stomach.  Keep yourself hydrated. Have water, diet soda, or unsweetened iced tea.  Regular soda, juice, and other mixers might contain a lot of carbohydrates and should be counted. WHAT FOODS ARE NOT RECOMMENDED? As you make food choices, it is important to remember that all foods are not the same. Some foods have fewer nutrients per serving than other  foods, even though they might have the same number of calories or carbohydrates. It is difficult to get your body what it needs when you eat foods with fewer nutrients. Examples of foods that you should avoid that are high in calories and carbohydrates but low in nutrients include:  Trans fats (most processed foods list trans fats on the Nutrition Facts label).  Regular soda.  Juice.  Candy.  Sweets, such as cake, pie, doughnuts, and cookies.  Fried foods. WHAT FOODS CAN I EAT? Have nutrient-rich foods, which will nourish your body and keep you healthy. The food you should eat also will depend on several factors, including:  The calories you need.  The medicines you take.  Your weight.  Your blood glucose level.  Your blood pressure level.  Your cholesterol level. You also should eat a variety of foods, including:  Protein, such as meat, poultry, fish, tofu, nuts, and seeds (lean animal proteins are best).  Fruits.  Vegetables.  Dairy products, such as milk, cheese, and yogurt (low fat is best).  Breads, grains, pasta, cereal, rice, and beans.  Fats such as olive oil, trans fat-free margarine, canola oil, avocado, and olives. DOES EVERYONE WITH DIABETES MELLITUS HAVE THE SAME MEAL PLAN? Because every person with diabetes mellitus is different, there is not one meal plan that works for everyone. It is very important that you meet with a dietitian who will help you create a meal plan that is just right for you. Document Released: 06/25/2005 Document Revised: 10/03/2013 Document Reviewed: 08/25/2013 Exit[employer] Patient Information 2015 [employer]. This   information is not intended to replace advice given to you by your health care provider. Make sure you discuss any questions you have with your health care provider.  

## 2015-03-13 ENCOUNTER — Other Ambulatory Visit: Payer: Self-pay | Admitting: Obstetrics & Gynecology

## 2015-03-13 NOTE — Telephone Encounter (Signed)
Rx declined at this time.   Last mammogram was September 2014 according to our records.  Please have patient schedule her mammogram if not done in the past year.  If it was done, please have her forward the mammogram to Dr. Sabra Heck.   Keep appointment for her exam for 03/15/15 - Dr. Sabra Heck.

## 2015-03-13 NOTE — Telephone Encounter (Signed)
Medication refill request: Vivelle Dot 0.075 mg Last AEX:  03/07/14 with SM Next AEX: 6/3/1/6 with SM Last MMG (if hormonal medication request): 06/30/13 bi-rads 1: negative Refill authorized: Please advise.  (routed to Dr. Quincy Simmonds since Dr. Sabra Heck is out of the office)

## 2015-03-14 MED ORDER — ESTRADIOL 0.075 MG/24HR TD PTTW: 1.0000 | MEDICATED_PATCH | TRANSDERMAL | Status: DC

## 2015-03-14 NOTE — Telephone Encounter (Signed)
appt scheduled 03/15/15.  Please let pt know RF is done.

## 2015-03-14 NOTE — Addendum Note (Signed)
Addended by: Megan Salon on: 03/14/2015 12:11 PM   Modules accepted: Orders

## 2015-03-14 NOTE — Telephone Encounter (Signed)
Left Message To Call Back  

## 2015-03-14 NOTE — Telephone Encounter (Signed)
Patient notified that rx has been sent. 

## 2015-03-14 NOTE — Telephone Encounter (Signed)
Patient is aware she needs to schedule, she said she has been busy with school. Patient is coming in for AEX tomorrow with Dr. Sabra Heck she said she will discuss it with her then.  Routed to Dr. Lestine Box.

## 2015-03-15 ENCOUNTER — Encounter: Payer: Self-pay | Admitting: Obstetrics & Gynecology

## 2015-03-15 ENCOUNTER — Other Ambulatory Visit: Payer: Self-pay | Admitting: Obstetrics & Gynecology

## 2015-03-15 ENCOUNTER — Ambulatory Visit (INDEPENDENT_AMBULATORY_CARE_PROVIDER_SITE_OTHER): Payer: BLUE CROSS/BLUE SHIELD | Admitting: Obstetrics & Gynecology

## 2015-03-15 VITALS — BP 130/84 | HR 88 | Resp 16 | Ht 63.5 in | Wt 181.0 lb

## 2015-03-15 DIAGNOSIS — Z0000 Encounter for general adult medical examination without abnormal findings: Secondary | ICD-10-CM

## 2015-03-15 DIAGNOSIS — R232 Flushing: Secondary | ICD-10-CM

## 2015-03-15 DIAGNOSIS — N951 Menopausal and female climacteric states: Secondary | ICD-10-CM | POA: Diagnosis not present

## 2015-03-15 DIAGNOSIS — Z01419 Encounter for gynecological examination (general) (routine) without abnormal findings: Secondary | ICD-10-CM | POA: Diagnosis not present

## 2015-03-15 LAB — POCT URINALYSIS DIPSTICK
BILIRUBIN UA: NEGATIVE
Blood, UA: NEGATIVE
Glucose, UA: NEGATIVE
KETONES UA: NEGATIVE
Nitrite, UA: NEGATIVE
PROTEIN UA: NEGATIVE
Urobilinogen, UA: NEGATIVE
pH, UA: 5

## 2015-03-15 NOTE — Progress Notes (Addendum)
49 y.o. G0P0 SingleCaucasianF here for annual exam.  Doing well.  Partner is really deteriorating due to pian/joint issues.  Pt reports partner has seen a rheumatologist but blood work has been all normal.    Working full time at [employer] is going to be Fri, Sat, Sun.    No vaginal bleeding.  KNOWS mammogram overdue.    Patient's last menstrual period was 05/31/2013.          Sexually active: No.  The current method of family planning is abstinence and status post hysterectomy.    Exercising: Yes.    Bike Smoker:  no  Health Maintenance: Pap: 09/30/12 Neg. HR HPV:Neg History of abnormal Pap:  no MMG:  06/30/13 BIRADS1:Neg Self Breast Exam: yes, monthly  Colonoscopy:  2012 - repeat 10 years (pt prefers to do this next year and not wait 10 years) BMD:   Never TDaP:  2013 Screening Labs: PCP, Hb today: PCP, Urine today: WBC=Small   reports that she has never smoked. She has never used smokeless tobacco. She reports that she does not drink alcohol or use illicit drugs.  Past Medical History  Diagnosis Date  . Migraine headache   . Myalgia     Chronic  . Frequent UTI   . Pre-diabetes   . MGUS (monoclonal gammopathy of unknown significance)   . Kidney stone     Past Surgical History  Procedure Laterality Date  . Appendectomy    . Cholecystectomy    . Tonsillectomy  1973  . Colonoscopy w/ endoscopic [place]  09/27/06    02/2011   . Esophagogastroduodenoscopy    . Robotic assisted total hysterectomy with bilateral salpingo oopherectomy Bilateral 07/03/2013    Procedure: ROBOTIC ASSISTED TOTAL HYSTERECTOMY WITH BILATERAL SALPINGO OOPHORECTOMY;  Surgeon: Lyman Speller, MD;  Location: [facility];  Service: Gynecology;  Laterality: Bilateral;  . Cystoscopy N/A 07/03/2013    Procedure: CYSTOSCOPY;  Surgeon: Lyman Speller, MD;  Location: [facility];  Service: Gynecology;  Laterality: N/A;    Current Outpatient Prescriptions  Medication Sig Dispense Refill  . diazepam  (VALIUM) 5 MG tablet TAKE 1 TABLET AT BEDTIME AS NEEDED. 30 tablet 4  . docusate sodium (COLACE) 100 MG capsule Take 100 mg by mouth as needed.     Marland Kitchen estradiol (VIVELLE-DOT) 0.075 MG/24HR Place 1 patch onto the skin 2 (two) times a week. 24 patch 0  . FLUoxetine (PROZAC) 10 MG tablet TAKE ONE TABLET BY MOUTH ONCE DAILY. 30 tablet 5  . gabapentin (NEURONTIN) 100 MG capsule Take 1 capsule (100 mg total) by mouth 2 (two) times daily. (Patient taking differently: Take 100 mg by mouth 2 (two) times daily as needed. ) 60 capsule 6  . HYDROcodone-acetaminophen (NORCO/VICODIN) 5-325 MG per tablet Take 1 tablet by mouth every 6 (six) hours as needed. 60 tablet 0  . lidocaine (XYLOCAINE JELLY) 2 % jelly Apply 1 application topically as needed. 30 mL 0  . lidocaine (XYLOCAINE) 5 % ointment Apply 1 application topically as needed. 35.44 g 6  . loratadine (CLARITIN) 10 MG tablet Take 10 mg by mouth daily.    Marland Kitchen lubiprostone (AMITIZA) 24 MCG capsule Take 1 capsule (24 mcg total) by mouth 2 (two) times daily. (Patient taking differently: Take 24 mcg by mouth 2 (two) times daily as needed. ) 60 capsule 10  . meloxicam (MOBIC) 15 MG tablet Take 1 tablet (15 mg total) by mouth daily. Do not use on same day as naproxen. 30 tablet  5  . metFORMIN (GLUCOPHAGE-XR) 500 MG 24 hr tablet TAKE (1) TABLET BY MOUTH EACH MORNING. 30 tablet 5  . naproxen (NAPROSYN) 500 MG tablet TAKE ONE TABLET BY MOUTH TWICE DAILY AS NEEDED FOR HEADACHE. STOP USING MELOXICAM WHILE TAKING THIS MEDICATION. 60 tablet 5  . omeprazole (PRILOSEC) 40 MG capsule Take 1 capsule (40 mg total) by mouth daily. 30 capsule 12  . propranolol (INDERAL) 10 MG tablet Take 2 tablets (20 mg total) by mouth 2 (two) times daily as needed. 60 tablet 11  . triamcinolone (NASACORT) 55 MCG/ACT AERO nasal inhaler USE 1 SPRAY IN EACH NOSTRIL DAILY. 1 Inhaler 12   No current facility-administered medications for this visit.    Family History  Problem Relation Age of Onset   . Prostate cancer Father     Deceased, 15  . COPD Mother     Deceased, 2  . Emphysema Sister   . Healthy Brother   . Liver cancer Other     and pancreatic cancer-paternal cousin  . Liver cancer Other     paternal cousin    ROS:  Pertinent items are noted in HPI.  Otherwise, a comprehensive ROS was negative.  Exam:   BP 130/84 mmHg  Pulse 88  Resp 16  Ht 5' 3.5" (1.613 m)  Wt 181 lb (82.101 kg)  BMI 31.56 kg/m2  LMP 05/31/2013  Weight change: -9#  Height: 5' 3.5" (161.3 cm)  Ht Readings from Last 3 Encounters:  03/15/15 5' 3.5" (1.613 m)  02/26/15 5\' 3"  (1.6 m)  10/03/14 5\' 3"  (1.6 m)   General appearance: alert, cooperative and appears stated age Head: Normocephalic, without obvious abnormality, atraumatic Neck: no adenopathy, supple, symmetrical, trachea midline and thyroid normal to inspection and palpation Lungs: clear to auscultation bilaterally Breasts: normal appearance, no masses or tenderness Heart: regular rate and rhythm Abdomen: soft, non-tender; bowel sounds normal; no masses,  no organomegaly Extremities: extremities normal, atraumatic, no cyanosis or edema Skin: Skin color, texture, turgor normal. No rashes or lesions Lymph nodes: Cervical, supraclavicular, and axillary nodes normal. No abnormal inguinal nodes palpated Neurologic: Grossly normal   Pelvic: External genitalia:  no lesions              Urethra:  normal appearing urethra with no masses, tenderness or lesions              Bartholins and Skenes: normal                 Vagina: normal appearing vagina with normal color and discharge, no lesions              Cervix: absent              Pap taken: No. Bimanual Exam:  Uterus:  uterus absent              Adnexa: no mass, fullness, tenderness               Rectovaginal: Confirms               Anus:  normal sphincter tone, no lesions  Chaperone was present for exam.  A:  Well Woman with normal exam Pre-diabetes Migraines GERD, gastric  polyps, IBS, fatty liver disease Sinusitis  P: Mammogram yearly.  KNOWS this is due.   pap smear not indicated Vivelle dot 0.075mg  patches twice weekly. 90 day supply/0RF.  Pt knows needs MMG before next RF. Estradiol level TSH today as well return annually or prn

## 2015-03-15 NOTE — Addendum Note (Signed)
Addended by: Megan Salon on: 03/15/2015 05:16 PM   Modules accepted: Orders, SmartSet

## 2015-03-15 NOTE — Addendum Note (Signed)
Addended by: Megan Salon on: 03/15/2015 03:34 PM   Modules accepted: Orders, SmartSet

## 2015-03-16 LAB — ESTRADIOL: ESTRADIOL: 38.6 pg/mL

## 2015-03-18 LAB — TSH: TSH: 1.404 u[IU]/mL (ref 0.350–4.500)

## 2015-03-18 NOTE — Addendum Note (Signed)
Addended by: Robley Fries on: 03/18/2015 09:43 AM   Modules accepted: Orders, SmartSet

## 2015-03-22 ENCOUNTER — Telehealth: Payer: Self-pay

## 2015-03-22 NOTE — Telephone Encounter (Signed)
-----   Message from Megan Salon, MD sent at 03/19/2015 12:53 PM EDT ----- Inform pt estradiol level is stable.  Is 67.  Was 36 1 year ago.  Ok to increase patch to 0.1mg  if desires to see if will help with feeling "hot".  Also, TSH was normal.  I just signed off on this but didn't send it to you.  Labs are released to pt as well.

## 2015-03-22 NOTE — Telephone Encounter (Signed)
Lmtcb//kn 

## 2015-03-27 NOTE — Telephone Encounter (Signed)
Returning a call to Kelly °

## 2015-03-28 MED ORDER — ESTRADIOL 0.1 MG/24HR TD PTTW: 1.0000 | MEDICATED_PATCH | TRANSDERMAL | Status: DC

## 2015-03-28 NOTE — Telephone Encounter (Signed)
Patient called back notified her of results. She would like to increase her patch to 0.1 mg.  Estradiol 0.1 mg #24/3 rfs sent to [facility].  Routed to provider for review, encounter closed.

## 2015-06-27 ENCOUNTER — Telehealth: Payer: Self-pay | Admitting: Family Medicine

## 2015-06-27 NOTE — Telephone Encounter (Signed)
Patient has appointment in Oct for med check and wanting to know if she needs A1C done.States she doesnt do finger stick.

## 2015-07-01 ENCOUNTER — Ambulatory Visit: Payer: BLUE CROSS/BLUE SHIELD | Admitting: Family Medicine

## 2015-07-26 ENCOUNTER — Ambulatory Visit (INDEPENDENT_AMBULATORY_CARE_PROVIDER_SITE_OTHER): Payer: BLUE CROSS/BLUE SHIELD | Admitting: Family Medicine

## 2015-07-26 ENCOUNTER — Encounter: Payer: Self-pay | Admitting: Family Medicine

## 2015-07-26 VITALS — BP 122/80 | Ht 63.0 in | Wt 176.0 lb

## 2015-07-26 DIAGNOSIS — R740 Nonspecific elevation of levels of transaminase and lactic acid dehydrogenase [LDH]: Secondary | ICD-10-CM

## 2015-07-26 DIAGNOSIS — M79671 Pain in right foot: Secondary | ICD-10-CM | POA: Diagnosis not present

## 2015-07-26 DIAGNOSIS — M5431 Sciatica, right side: Secondary | ICD-10-CM | POA: Diagnosis not present

## 2015-07-26 DIAGNOSIS — E785 Hyperlipidemia, unspecified: Secondary | ICD-10-CM

## 2015-07-26 DIAGNOSIS — E119 Type 2 diabetes mellitus without complications: Secondary | ICD-10-CM | POA: Diagnosis not present

## 2015-07-26 DIAGNOSIS — R7401 Elevation of levels of liver transaminase levels: Secondary | ICD-10-CM | POA: Insufficient documentation

## 2015-07-26 DIAGNOSIS — M5430 Sciatica, unspecified side: Secondary | ICD-10-CM | POA: Insufficient documentation

## 2015-07-26 MED ORDER — GABAPENTIN 100 MG PO CAPS: ORAL_CAPSULE | ORAL | Status: DC

## 2015-07-26 MED ORDER — HYDROCODONE-ACETAMINOPHEN 5-325 MG PO TABS: 1.0000 | ORAL_TABLET | Freq: Four times a day (QID) | ORAL | Status: DC | PRN

## 2015-07-26 MED ORDER — HYDROCODONE-ACETAMINOPHEN 5-325 MG PO TABS
1.0000 | ORAL_TABLET | Freq: Four times a day (QID) | ORAL | Status: DC | PRN
Start: 2015-07-26 — End: 2015-07-26

## 2015-07-26 MED ORDER — LUBIPROSTONE 24 MCG PO CAPS: 24.0000 ug | ORAL_CAPSULE | Freq: Two times a day (BID) | ORAL | Status: DC

## 2015-07-26 MED ORDER — METFORMIN HCL ER 500 MG PO TB24: ORAL_TABLET | ORAL | Status: DC

## 2015-07-26 MED ORDER — MELOXICAM 15 MG PO TABS: 15.0000 mg | ORAL_TABLET | Freq: Every day | ORAL | Status: DC

## 2015-07-26 MED ORDER — LIDOCAINE 5 % EX OINT: 1.0000 "application " | TOPICAL_OINTMENT | CUTANEOUS | Status: DC | PRN

## 2015-07-26 MED ORDER — NAPROXEN 500 MG PO TABS: ORAL_TABLET | ORAL | Status: DC

## 2015-07-26 MED ORDER — OMEPRAZOLE 40 MG PO CPDR: 40.0000 mg | DELAYED_RELEASE_CAPSULE | Freq: Every day | ORAL | Status: DC

## 2015-07-26 MED ORDER — TRIAMCINOLONE ACETONIDE 55 MCG/ACT NA AERO
INHALATION_SPRAY | NASAL | Status: AC
Start: 2015-07-26 — End: ?

## 2015-07-26 MED ORDER — PROPRANOLOL HCL 10 MG PO TABS: 20.0000 mg | ORAL_TABLET | Freq: Two times a day (BID) | ORAL | Status: DC | PRN

## 2015-07-26 MED ORDER — FLUOXETINE HCL 10 MG PO TABS: 10.0000 mg | ORAL_TABLET | Freq: Every day | ORAL | Status: DC

## 2015-07-26 MED ORDER — DIAZEPAM 5 MG PO TABS: ORAL_TABLET | ORAL | Status: DC

## 2015-07-26 NOTE — Progress Notes (Signed)
   Subjective:    Patient ID: Lisa Li, female    DOB: 1966-06-08, 49 y.o.   MRN: [id number]  Diabetes She presents for her follow-up diabetic visit. She has type 2 diabetes mellitus. Pertinent negatives for hypoglycemia include no confusion. Pertinent negatives for diabetes include no chest pain, no fatigue, no polydipsia, no polyphagia and no weakness.  pt does not check blood sugars.  Pt declined A1C today. She does not want to do a finger stick. Wants to do at lab.   Pt is eating better and exercising. Has lost some weight since last visit.  Needs refills on all meds. Patient is working hard at losing weight  Having back pain. Herniated disc L 4 and L5. Had injection within the year. Using gabapentin this does help a little but not a lot.  Right foot pain. Pain on bottom of foot. Uses inserts in shoes. Patient relates pain discomfort present over the past month tried OTC anti-inflammatories without much help  Needs refill on hydrocodone for migraines. Patient has intermittent migraines uses hydrocodone infrequently she requests refills on this she also states that the hydrocodone helps her with her back pain and she uses no more than 2 a day for her back. She was given 3 prescriptions today when these run out she will need to be seen.  Had flu vaccine at work yesterday.    Review of Systems  Constitutional: Negative for activity change, appetite change and fatigue.  HENT: Negative for congestion.   Respiratory: Negative for cough.   Cardiovascular: Negative for chest pain.  Gastrointestinal: Negative for abdominal pain.  Endocrine: Negative for polydipsia and polyphagia.  Neurological: Negative for weakness.  Psychiatric/Behavioral: Negative for confusion.       Objective:   Physical Exam  Constitutional: She appears well-nourished. No distress.  Cardiovascular: Normal rate, regular rhythm and normal heart sounds.   No murmur heard. Pulmonary/Chest: Effort normal and  breath sounds normal. No respiratory distress.  Musculoskeletal: She exhibits no edema.  Lymphadenopathy:    She has no cervical adenopathy.  Neurological: She is alert. She exhibits normal muscle tone.  Psychiatric: Her behavior is normal.  Vitals reviewed.         Assessment & Plan:  1. Type 2 diabetes mellitus without complication, unspecified long term insulin use status ([place]) Patient does not one to do A1c here. She states it causes significant pain we will draw as a venous stick. She relates she is trying her best to watch her diet. - Hemoglobin A1c  2. Sciatica, right Patient with significant sciatica I recommended for the patient at increased gabapentin we will do 2 tablets 3 times daily if drowsiness she will notify us if it is not doing enough we will bump the dose up to 300 mg 3 times a day.  3. Right foot pain Patient with significant foot pain metatarsal probable inflammation. X-ray ordered. She will do this at [facility]. They will send [place] the x-ray report. - DG Foot Complete Right  4. Hyperlipidemia LDL goal <100 The patient is due for lipid profile. She was told the importance of keeping LDL below 100. Await the results. - Lipid panel  5. Elevated transaminase level History elevated liver enzymes patient encouraged to try to lose weight check lab work results - Hepatic function panel

## 2015-07-27 LAB — HEMOGLOBIN A1C
ESTIMATED AVERAGE GLUCOSE: 134 mg/dL
Hgb A1c MFr Bld: 6.3 % — ABNORMAL HIGH (ref 4.8–5.6)

## 2015-07-27 LAB — HEPATIC FUNCTION PANEL
ALBUMIN: 4.6 g/dL (ref 3.5–5.5)
ALK PHOS: 48 IU/L (ref 39–117)
ALT: 23 IU/L (ref 0–32)
AST: 18 IU/L (ref 0–40)
BILIRUBIN TOTAL: 0.3 mg/dL (ref 0.0–1.2)
BILIRUBIN, DIRECT: 0.09 mg/dL (ref 0.00–0.40)
TOTAL PROTEIN: 6.7 g/dL (ref 6.0–8.5)

## 2015-07-27 LAB — LIPID PANEL
CHOL/HDL RATIO: 4.2 ratio (ref 0.0–4.4)
CHOLESTEROL TOTAL: 199 mg/dL (ref 100–199)
HDL: 47 mg/dL (ref >39)
LDL CALC: 122 mg/dL — AB (ref 0–99)
TRIGLYCERIDES: 148 mg/dL (ref 0–149)
VLDL Cholesterol Cal: 30 mg/dL (ref 5–40)

## 2015-07-29 ENCOUNTER — Telehealth: Payer: Self-pay | Admitting: *Deleted

## 2015-07-29 NOTE — Telephone Encounter (Signed)
Foot xray from [facility] in folder for your review.

## 2015-07-30 NOTE — Telephone Encounter (Signed)
X-ray negative. Patient may be aware this already. She already has Mobic but if this is not helping inflammation next step is podiatry may give referral if patient desires

## 2015-07-30 NOTE — Telephone Encounter (Signed)
Pt wants to know your opinion on dr Clide Dales. He is a podiatrist in [place]. If you do not know him she wants to know who you recommend and let her know who before she decides if she wants referral.

## 2015-07-31 NOTE — Telephone Encounter (Signed)
Patient stated she wants to do some research on both of the doctors and will let us know her choice when we call her with her blood work results.

## 2015-07-31 NOTE — Telephone Encounter (Signed)
I dont know him I use Dr Marcheta Grammes in town eiather one will work

## 2015-07-31 NOTE — Telephone Encounter (Signed)
left message to return call

## 2015-08-08 ENCOUNTER — Encounter: Payer: Self-pay | Admitting: Family Medicine

## 2015-08-23 ENCOUNTER — Encounter: Payer: Self-pay | Admitting: Family Medicine

## 2015-08-23 DIAGNOSIS — Z86010 Personal history of colonic polyps: Secondary | ICD-10-CM

## 2015-10-13 DIAGNOSIS — H8109 Meniere's disease, unspecified ear: Secondary | ICD-10-CM

## 2015-10-13 HISTORY — DX: Meniere's disease, unspecified ear: H81.09

## 2016-01-16 ENCOUNTER — Telehealth: Payer: Self-pay | Admitting: Family Medicine

## 2016-01-16 DIAGNOSIS — E119 Type 2 diabetes mellitus without complications: Secondary | ICD-10-CM

## 2016-01-16 DIAGNOSIS — E785 Hyperlipidemia, unspecified: Secondary | ICD-10-CM

## 2016-01-16 NOTE — Telephone Encounter (Signed)
bw orders for appt coming up  Last labs 07/26/15  Lip Hep A1C   Call when ready

## 2016-01-16 NOTE — Telephone Encounter (Signed)
Lipid, liver, hemoglobin A1c, urine ACR 

## 2016-01-17 NOTE — Telephone Encounter (Signed)
[facility] 01/17/16

## 2016-01-20 NOTE — Telephone Encounter (Signed)
Patient notified

## 2016-01-21 ENCOUNTER — Ambulatory Visit (INDEPENDENT_AMBULATORY_CARE_PROVIDER_SITE_OTHER): Payer: BLUE CROSS/BLUE SHIELD | Admitting: Family Medicine

## 2016-01-21 ENCOUNTER — Encounter: Payer: Self-pay | Admitting: Family Medicine

## 2016-01-21 VITALS — BP 114/80 | Temp 98.4°F | Ht 63.0 in | Wt 183.1 lb

## 2016-01-21 DIAGNOSIS — E119 Type 2 diabetes mellitus without complications: Secondary | ICD-10-CM | POA: Diagnosis not present

## 2016-01-21 DIAGNOSIS — E785 Hyperlipidemia, unspecified: Secondary | ICD-10-CM

## 2016-01-21 DIAGNOSIS — J309 Allergic rhinitis, unspecified: Secondary | ICD-10-CM | POA: Diagnosis not present

## 2016-01-21 DIAGNOSIS — A084 Viral intestinal infection, unspecified: Secondary | ICD-10-CM | POA: Diagnosis not present

## 2016-01-21 DIAGNOSIS — M791 Myalgia, unspecified site: Secondary | ICD-10-CM

## 2016-01-21 MED ORDER — HYDROCODONE-ACETAMINOPHEN 5-325 MG PO TABS: 1.0000 | ORAL_TABLET | Freq: Four times a day (QID) | ORAL | Status: DC | PRN

## 2016-01-21 MED ORDER — GABAPENTIN 300 MG PO CAPS: ORAL_CAPSULE | ORAL | Status: DC

## 2016-01-21 MED ORDER — ONDANSETRON 8 MG PO TBDP: 8.0000 mg | ORAL_TABLET | Freq: Three times a day (TID) | ORAL | Status: DC | PRN

## 2016-01-21 NOTE — Progress Notes (Signed)
   Subjective:    Patient ID: Lisa Li, female    DOB: 1966-07-04, 50 y.o.   MRN: [id number]  Diabetes She presents for her follow-up diabetic visit. She has type 2 diabetes mellitus. No MedicAlert identification noted. Pertinent negatives for hypoglycemia include no confusion. Pertinent negatives for diabetes include no chest pain, no fatigue, no polydipsia, no polyphagia and no weakness. She does not see a podiatrist.Eye exam is not current.   Patient has concerns of muscle aches, vomiting, and diarrhea. Patient had viral like illness over the past couple days body aches low-grade fever vomiting diarrhea watery stools denies high fever chills sweats   Has c/o of neuropathy pain. Mainly the neuropathic pain she describes as burning in her legs and burning in her feet diabetic foot exam was normal. Patient has had neuropathy testing before and showed polyneuropathy. Neurontin helps but the current doses not helping enough. Patient does try to watch fats in her diet tries to stay physically active she does state that omeprazole helps her with reflux. She also states that she is taking OTC medicines for allergies.  Review of Systems  Constitutional: Negative for activity change, appetite change and fatigue.  HENT: Positive for rhinorrhea and sinus pressure. Negative for congestion.   Respiratory: Negative for cough.   Cardiovascular: Negative for chest pain.  Gastrointestinal: Positive for nausea, vomiting and diarrhea. Negative for abdominal pain.  Endocrine: Negative for polydipsia and polyphagia.  Neurological: Negative for weakness.  Psychiatric/Behavioral: Negative for confusion.       Objective:   Physical Exam  Constitutional: She appears well-nourished. No distress.  Cardiovascular: Normal rate, regular rhythm and normal heart sounds.   No murmur heard. Pulmonary/Chest: Effort normal and breath sounds normal. No respiratory distress.  Musculoskeletal: She exhibits no edema.   Lymphadenopathy:    She has no cervical adenopathy.  Neurological: She is alert. She exhibits normal muscle tone.  Psychiatric: Her behavior is normal.  Vitals reviewed.   25 minutes was spent with the patient. Greater than half the time was spent in discussion and answering questions and counseling regarding the issues that the patient came in for today.       Assessment & Plan:  Diabetes-check A1c. Patient unable to get lab work completed. Await the results  Hyperlipidemia patient using OTC measures currently may have to be on a statin. Await the results of the cholesterol profile healthy diet regular physical activity recommended  Viral gastroenteritis Zofran as needed encourage clear liquids should get better over the next few days  Allergic rhinitis OTC measures discuss along with Flonase as necessary if progressive troubles notify us  Significant myalgias and body aches could be fibromyalgia but will be doing some tests to make sure that there is not underlying autoimmune illness  Because of significant joint pains patient may take hydrocodone maximum 3 times per day unfortunately she is not been able to manage her symptoms with just Tylenol or ibuprofen. Her pain medicine is necessary in order for her to be able to function and work she does not abuse her medicine  Lab work ordered await the results  Neuropathy-increase gabapentin. Hopefully this will help if not she will let us know may need to consider Cymbalta in the future

## 2016-01-27 ENCOUNTER — Other Ambulatory Visit: Payer: Self-pay | Admitting: *Deleted

## 2016-01-27 ENCOUNTER — Telehealth: Payer: Self-pay | Admitting: Family Medicine

## 2016-01-27 MED ORDER — FLUCONAZOLE 150 MG PO TABS: 150.0000 mg | ORAL_TABLET | Freq: Every day | ORAL | Status: DC

## 2016-01-27 MED ORDER — CEFPROZIL 500 MG PO TABS
ORAL_TABLET | ORAL | Status: DC
Start: 2016-01-27 — End: 2016-04-21

## 2016-01-27 MED ORDER — BENZONATATE 100 MG PO CAPS: 100.0000 mg | ORAL_CAPSULE | Freq: Three times a day (TID) | ORAL | Status: DC | PRN

## 2016-01-27 NOTE — Telephone Encounter (Signed)
Cefzil 500 mg 1 twice a day for 10 days, Diflucan 150 mg 1 by mouth 1 with 3 refills, Tessalon 100 mg 1 3 times a day when necessary cough #21 2 refills. Follow-up if troubles

## 2016-01-27 NOTE — Telephone Encounter (Signed)
Discussed with pt. meds sent to pharm.  

## 2016-01-27 NOTE — Telephone Encounter (Signed)
Patient seen on 01/21/16 and was having a runny/stopped up nose.  She said last night she started coughing up stuff, sinuses are stopped up, throat scratchy and she has green mucous.  She was told to call back if her symptoms got worse.  Please advise.    [employer]

## 2016-01-27 NOTE — Telephone Encounter (Signed)
Seen 4/11 for allergic rhinitis. Pt states last night she started coughing up green mucus, green nasal drainage, scratchy throat. No fever, no trouble breathing. Pt requesting antibiotic, diflucan and tessalon pearls for cough.

## 2016-01-31 LAB — HEPATIC FUNCTION PANEL
ALT: 36 IU/L — AB (ref 0–32)
AST: 29 IU/L (ref 0–40)
Albumin: 4.4 g/dL (ref 3.5–5.5)
Alkaline Phosphatase: 40 IU/L (ref 39–117)
BILIRUBIN TOTAL: 0.3 mg/dL (ref 0.0–1.2)
BILIRUBIN, DIRECT: 0.08 mg/dL (ref 0.00–0.40)
Total Protein: 6.8 g/dL (ref 6.0–8.5)

## 2016-01-31 LAB — LIPID PANEL
CHOLESTEROL TOTAL: 177 mg/dL (ref 100–199)
Chol/HDL Ratio: 3.9 ratio units (ref 0.0–4.4)
HDL: 45 mg/dL (ref >39)
LDL CALC: 104 mg/dL — AB (ref 0–99)
TRIGLYCERIDES: 142 mg/dL (ref 0–149)
VLDL Cholesterol Cal: 28 mg/dL (ref 5–40)

## 2016-01-31 LAB — MICROALBUMIN / CREATININE URINE RATIO
CREATININE, UR: 29.6 mg/dL
MICROALB/CREAT RATIO: <10.1 mg/g creat (ref 0.0–30.0)
Microalbumin, Urine: <3 ug/mL

## 2016-01-31 LAB — ANA: Anti Nuclear Antibody(ANA): NEGATIVE

## 2016-01-31 LAB — HEMOGLOBIN A1C
Est. average glucose Bld gHb Est-mCnc: 140 mg/dL
Hgb A1c MFr Bld: 6.5 % — ABNORMAL HIGH (ref 4.8–5.6)

## 2016-01-31 LAB — C-REACTIVE PROTEIN: CRP: 2.6 mg/L (ref 0.0–4.9)

## 2016-01-31 LAB — CK: Total CK: 61 U/L (ref 24–173)

## 2016-01-31 LAB — SEDIMENTATION RATE: SED RATE: 2 mm/h (ref 0–40)

## 2016-02-03 ENCOUNTER — Other Ambulatory Visit: Payer: Self-pay | Admitting: Family Medicine

## 2016-02-04 ENCOUNTER — Telehealth: Payer: Self-pay | Admitting: Family Medicine

## 2016-02-04 MED ORDER — AMOXICILLIN-POT CLAVULANATE 875-125 MG PO TABS: 1.0000 | ORAL_TABLET | Freq: Two times a day (BID) | ORAL | Status: DC

## 2016-02-04 MED ORDER — FLUCONAZOLE 150 MG PO TABS: 150.0000 mg | ORAL_TABLET | Freq: Every day | ORAL | Status: DC

## 2016-02-04 NOTE — Telephone Encounter (Signed)
It would be reasonable to go with Augmentin 875 one twice a day for 10 days if patient is concerned about this causing intestinal upset then I would recommend doxycycline 100 mg 1 twice a day 10-if obviously still having issues after another round of antibiotics the next step would be is consideration for [place] to set her up with ENT

## 2016-02-04 NOTE — Telephone Encounter (Signed)
Patient wanted Augmentin. Med sent to pharmacy. Dr. Nicki Reaper approved patient to have work excuse for the rest of the week. Patient wants work excuse sent to her MyChart if possible. If not please call her and let her know.

## 2016-02-04 NOTE — Telephone Encounter (Signed)
Patient says she was given an antibiotic at her last visit 01/21/16 for rhinitis.  She says she is still on the antibiotic, but yesterday all of a sudden, something happened with her hearing and she feels like she is underwater.  She cant hardly hear out of her right ear.  She feels nauseated and her head feels weird.  She would like a nurse to call her back.

## 2016-02-04 NOTE — Telephone Encounter (Signed)
Patient has 2 days left of her antibiotic. Patient states that she can't hear out of her ears (they feel full). She is having a cough, sore throat and her head feels "funny". Patient states that she needs a work excuse for work because she doesn't think she can work [employer] like this.

## 2016-02-05 ENCOUNTER — Encounter: Payer: Self-pay | Admitting: Family Medicine

## 2016-02-05 NOTE — Telephone Encounter (Signed)
W/E done, patient notified

## 2016-02-06 ENCOUNTER — Telehealth: Payer: Self-pay | Admitting: Family Medicine

## 2016-02-06 NOTE — Telephone Encounter (Signed)
Patient transferred to front desk to schedule appointment.  

## 2016-02-06 NOTE — Telephone Encounter (Signed)
Pt is requesting to speak with a nurse regarding her ear.

## 2016-02-07 ENCOUNTER — Ambulatory Visit (INDEPENDENT_AMBULATORY_CARE_PROVIDER_SITE_OTHER): Payer: BLUE CROSS/BLUE SHIELD | Admitting: Family Medicine

## 2016-02-07 ENCOUNTER — Encounter: Payer: Self-pay | Admitting: Family Medicine

## 2016-02-07 VITALS — BP 140/84 | Ht 63.0 in | Wt 183.0 lb

## 2016-02-07 DIAGNOSIS — H9202 Otalgia, left ear: Secondary | ICD-10-CM | POA: Diagnosis not present

## 2016-02-07 DIAGNOSIS — H9192 Unspecified hearing loss, left ear: Secondary | ICD-10-CM | POA: Diagnosis not present

## 2016-02-07 MED ORDER — PREDNISONE 20 MG PO TABS: ORAL_TABLET | ORAL | Status: DC

## 2016-02-07 NOTE — Progress Notes (Signed)
   Subjective:    Patient ID: Lisa Li, female    DOB: 1966-05-27, 50 y.o.   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  Review of Systems Difficulty hearing on the left side. Relates minimal amount head congestion feels like she's in a barrel.    Objective:   Physical Exam Subjected discomfort on left side of her head no temporal tenderness noted. Eardrum is normal. Throat is normal. Neck no masses. Lungs are clear heart regular       Assessment & Plan:  Given her ear pain along with hearing loss I recommend urgent ENT referral. The patient will need to be evaluated hopefully can see ENT,Dr Benjamine Mola here in [place] if not [facility] preferably this coming Thursday.

## 2016-02-07 NOTE — Addendum Note (Signed)
Addended by: Dairl Ponder on: 02/07/2016 02:09 PM   Modules accepted: Orders

## 2016-02-07 NOTE — Progress Notes (Signed)
Urgent referral placed in EPIC.

## 2016-02-10 ENCOUNTER — Encounter: Payer: Self-pay | Admitting: Family Medicine

## 2016-04-03 ENCOUNTER — Other Ambulatory Visit: Payer: Self-pay | Admitting: Family Medicine

## 2016-04-21 ENCOUNTER — Ambulatory Visit (INDEPENDENT_AMBULATORY_CARE_PROVIDER_SITE_OTHER): Payer: BLUE CROSS/BLUE SHIELD | Admitting: Family Medicine

## 2016-04-21 VITALS — BP 126/82 | Ht 63.0 in | Wt 189.4 lb

## 2016-04-21 DIAGNOSIS — E785 Hyperlipidemia, unspecified: Secondary | ICD-10-CM | POA: Diagnosis not present

## 2016-04-21 DIAGNOSIS — E1140 Type 2 diabetes mellitus with diabetic neuropathy, unspecified: Secondary | ICD-10-CM | POA: Insufficient documentation

## 2016-04-21 DIAGNOSIS — R300 Dysuria: Secondary | ICD-10-CM

## 2016-04-21 DIAGNOSIS — R41840 Attention and concentration deficit: Secondary | ICD-10-CM | POA: Diagnosis not present

## 2016-04-21 DIAGNOSIS — M170 Bilateral primary osteoarthritis of knee: Secondary | ICD-10-CM

## 2016-04-21 DIAGNOSIS — E119 Type 2 diabetes mellitus without complications: Secondary | ICD-10-CM | POA: Diagnosis not present

## 2016-04-21 DIAGNOSIS — G43009 Migraine without aura, not intractable, without status migrainosus: Secondary | ICD-10-CM

## 2016-04-21 MED ORDER — MELOXICAM 15 MG PO TABS: ORAL_TABLET | ORAL | Status: DC

## 2016-04-21 MED ORDER — PREGABALIN 50 MG PO CAPS: 50.0000 mg | ORAL_CAPSULE | Freq: Two times a day (BID) | ORAL | Status: DC

## 2016-04-21 MED ORDER — CEFPROZIL 500 MG PO TABS: 500.0000 mg | ORAL_TABLET | Freq: Two times a day (BID) | ORAL | Status: DC

## 2016-04-21 MED ORDER — HYDROCODONE-ACETAMINOPHEN 5-325 MG PO TABS: 1.0000 | ORAL_TABLET | Freq: Three times a day (TID) | ORAL | Status: DC | PRN

## 2016-04-21 MED ORDER — DICLOFENAC SODIUM 1 % TD GEL: 4.0000 g | Freq: Four times a day (QID) | TRANSDERMAL | Status: DC

## 2016-04-21 NOTE — Progress Notes (Signed)
Subjective:    Patient ID: Lisa Li, female    DOB: 1966-07-16, 50 y.o.   MRN: [id number]  HPI  This patient was seen today for chronic pain  The medication list was reviewed and updated.   -Compliance with medication: yes  - Number patient states they take daily: not more than 2 a day but does not take med everyday just as needed  -when was the last dose patient took? Sunday 7/9 she thinks  The patient was advised the importance of maintaining medication and not using illegal substances with these.  Refills needed: yes  The patient was educated that we can provide 3 monthly scripts for their medication, it is their responsibility to follow the instructions.  Side effects or complications from medications: none  Patient is aware that pain medications are meant to minimize the severity of the pain to allow their pain levels to improve to allow for better function. They are aware of that pain medications cannot totally remove their pain.  The purpose of her pain medicine is for her back as well as her painful diabetic neuropathy as well as her osteoarthritis. Patient is on her feet all day long. She states she uses pain medication and does notabuse the medication. Patient takes typically due to a day maximum 3 per day  Due for UDT ( at least once per year) : the patient is unable to give urine specimen today because of frequency of urination and discomforts. We will focus on check in the urine to see if there is any sign of infection.  Needs refill of mobic and diclofenac gel-Patient uses anti-inflammatory on a regular basis when that does not give enough relief she uses diclofenac gel on top of that. She will use the gel on her knees sometimes her shoulders. This helps her left some the amount of pain medicine she uses.  urinary sx past monthpatient with dysuria urinary from C over the past month concerned about possibility of urinary tract infection  Diabetes okpatient feels  that she is doing well with her medication she does not check her sugars because she has severe worry of an phobia pricking her finger. She does take her medicine watch his diet and tries exercise.  Neuropathypatient with painful neuropathy in the feet would like to try something other than gabapentin because that is causing her drowsiness. We discussed multiple options. She is decided to try Lyrica.  Colonoscopy Dr Collene Mares pt will set uppatient has history of multiple colon polyps. She is due for another colonoscopy this year. Patient states she will try to set it up herself she will let us know if she is having problems  Severe migraine issues she takes Inderal on a regular basis when she gets a migraine she will sometimes have to take pain medicine sometimes Zofran with an anti-inflammatory she cannot take trip tans she cannot take Topamax because of side effects  Attention issues-she relates having significant time staying focused at work. Is causing her issues with her job performance. This is been going on for several months and seems to be getting worse. She denies depression or anxiety is the root cause. She states she had difficult time with focusing when she was a child in school and she wonders if she has adult ADD.  PMH family history see electronic record    Review of Systems She relates frequent headaches denies vomiting with it. Relates shoulder pain knee pain. Denies chest tightness shortness breath. Denies nausea vomiting.  Relates dysuria urinary frequency. Denies blurred vision. Relates painful neuropathy in the feet. Some neuropathy in the hands.    Objective:   Physical Exam  Neck no masses lungs are clear no crackles heart regular no murmurs pulse normal BP good diabetic foot exam completed without difficulty extremities no edema 40 minutes was spent with the patient. Greater than half the time was spent in discussion and answering questions and counseling regarding the issues  that the patient came in for today.      Assessment & Plan:  1. Hyperlipidemia LDL goal <100 Previous lab work reviewed with patient the importance of keeping LDL under 70 discuss check lab work await the results of the new lab work watch diet stay active try to lose weight - Lipid panel - Hepatic function panel  2. Type 2 diabetes mellitus without complication, unspecified long term insulin use status ([place]) Diabetes decent control in the past but she needs a recheck it again she will do this through a venous stick she does not one A1c. - Hemoglobin [id number] - Basic metabolic panel - Hepatic function panel  3. Dysuria Urinalysis under the microscope did not show infection we will check a urine culture Cefzil twice a day for 3 days - Urine culture  4. Migraine without aura and without status migrainosus, not intractable Severe migraine issues uses propranolol also takes an antidepressant fair control when she has flareups sometimes she has to take pain medication she is been advised not to use pain medicine frequently for headaches. Relaxation techniques and other measures are more helpful  5. Painful diabetic neuropathy ([place]) Patient with painful diabetic neuropathy we will try Lyrica twice daily see if this helps stop the gabapentin. Also pain medication no greater than 3 per day. Caution drowsiness. Notify us if any problems.  6. Attention deficit Questionnaire was given to the patient to fill out regarding ADD she is to send [place] back to [place] for further evaluation. May need to be on a trial of medication depending on the results of this profile  7. Primary osteoarthritis of both knees Significant pain in both knees continue anti-inflammatory use diclofenac gel if approved by insurance company. In addition to this a medication maybe use but no greater than 3 times per day. Patient is to follow-up in approximately one month for urine drug screen and at that time we will issue her ongoing  meds.

## 2016-04-23 LAB — URINE CULTURE: Organism ID, Bacteria: NO GROWTH

## 2016-04-24 ENCOUNTER — Telehealth: Payer: Self-pay | Admitting: Family Medicine

## 2016-04-24 ENCOUNTER — Encounter: Payer: Self-pay | Admitting: Family Medicine

## 2016-04-27 ENCOUNTER — Other Ambulatory Visit: Payer: Self-pay | Admitting: Obstetrics & Gynecology

## 2016-04-27 NOTE — Telephone Encounter (Signed)
Diclofenac 0.1% gel Approved via Cover my meds for 04/24/16-09/13/2038

## 2016-04-27 NOTE — Telephone Encounter (Signed)
Medication refill request: MINIVELLE 0.1 MG/24HR patch Last AEX:  03/07/14 SM Next AEX: 06/16/16  Last MMG (if hormonal medication request): 06/25/13 BIRADS1 negative as per chart in EPIC Refill authorized: 03/28/15 #24patch w/3 refills; today #24 w/0 refills? Please advise

## 2016-05-12 ENCOUNTER — Telehealth: Payer: Self-pay | Admitting: Obstetrics & Gynecology

## 2016-05-12 NOTE — Telephone Encounter (Signed)
Left patient a message to call back to reschedule a future appointment that was cancelled by the provider. °

## 2016-05-20 LAB — HEPATIC FUNCTION PANEL
ALK PHOS: 38 IU/L — AB (ref 39–117)
ALT: 68 IU/L — AB (ref 0–32)
AST: 50 IU/L — ABNORMAL HIGH (ref 0–40)
Albumin: 4.2 g/dL (ref 3.5–5.5)
BILIRUBIN TOTAL: 0.4 mg/dL (ref 0.0–1.2)
BILIRUBIN, DIRECT: 0.12 mg/dL (ref 0.00–0.40)
Total Protein: 6.5 g/dL (ref 6.0–8.5)

## 2016-05-20 LAB — BASIC METABOLIC PANEL
BUN/Creatinine Ratio: 19 (ref 9–23)
BUN: 16 mg/dL (ref 6–24)
CALCIUM: 9.4 mg/dL (ref 8.7–10.2)
CHLORIDE: 99 mmol/L (ref 96–106)
CO2: 28 mmol/L (ref 18–29)
Creatinine, Ser: 0.85 mg/dL (ref 0.57–1.00)
GFR calc Af Amer: 92 mL/min/{1.73_m2} (ref >59)
GFR calc non Af Amer: 80 mL/min/{1.73_m2} (ref >59)
GLUCOSE: 109 mg/dL — AB (ref 65–99)
POTASSIUM: 3.7 mmol/L (ref 3.5–5.2)
SODIUM: 140 mmol/L (ref 134–144)

## 2016-05-20 LAB — LIPID PANEL
CHOL/HDL RATIO: 3.9 ratio (ref 0.0–4.4)
CHOLESTEROL TOTAL: 185 mg/dL (ref 100–199)
HDL: 47 mg/dL (ref >39)
LDL Calculated: 90 mg/dL (ref 0–99)
TRIGLYCERIDES: 240 mg/dL — AB (ref 0–149)
VLDL Cholesterol Cal: 48 mg/dL — ABNORMAL HIGH (ref 5–40)

## 2016-05-20 LAB — HEMOGLOBIN A1C
ESTIMATED AVERAGE GLUCOSE: 143 mg/dL
HEMOGLOBIN A1C: 6.6 % — AB (ref 4.8–5.6)

## 2016-05-21 ENCOUNTER — Ambulatory Visit (INDEPENDENT_AMBULATORY_CARE_PROVIDER_SITE_OTHER): Payer: BLUE CROSS/BLUE SHIELD | Admitting: Family Medicine

## 2016-05-21 ENCOUNTER — Encounter: Payer: Self-pay | Admitting: Family Medicine

## 2016-05-21 VITALS — BP 130/74 | Ht 63.0 in | Wt 190.3 lb

## 2016-05-21 DIAGNOSIS — Z79891 Long term (current) use of opiate analgesic: Secondary | ICD-10-CM

## 2016-05-21 DIAGNOSIS — E1140 Type 2 diabetes mellitus with diabetic neuropathy, unspecified: Secondary | ICD-10-CM

## 2016-05-21 DIAGNOSIS — F988 Other specified behavioral and emotional disorders with onset usually occurring in childhood and adolescence: Secondary | ICD-10-CM

## 2016-05-21 DIAGNOSIS — F909 Attention-deficit hyperactivity disorder, unspecified type: Secondary | ICD-10-CM

## 2016-05-21 DIAGNOSIS — R7401 Elevation of levels of liver transaminase levels: Secondary | ICD-10-CM

## 2016-05-21 DIAGNOSIS — R740 Nonspecific elevation of levels of transaminase and lactic acid dehydrogenase [LDH]: Secondary | ICD-10-CM

## 2016-05-21 DIAGNOSIS — K760 Fatty (change of) liver, not elsewhere classified: Secondary | ICD-10-CM | POA: Diagnosis not present

## 2016-05-21 MED ORDER — HYDROCODONE-ACETAMINOPHEN 5-325 MG PO TABS: 1.0000 | ORAL_TABLET | Freq: Three times a day (TID) | ORAL | 0 refills | Status: DC | PRN

## 2016-05-21 MED ORDER — AMPHETAMINE-DEXTROAMPHET ER 15 MG PO CP24: 15.0000 mg | ORAL_CAPSULE | ORAL | 0 refills | Status: DC

## 2016-05-21 NOTE — Progress Notes (Signed)
   Subjective:    Patient ID: Lisa Li, female    DOB: October 19, 1965, 50 y.o.   MRN: [id number]  HPI Patient arrives to discuss recent labs and needs prescription for pain med-only got one script at check up last month. She denies any chest tightness pressure pain shortness breath She states pain medicine is helping her with chronic back pain and pain down her leg. She uses it 2 or 3 times per day She denies misusing the medication or using other medicines She does have symptoms of ADD is affecting her work performance she did fill out a questionnaire regarding this we have discussed this on a previous visit. Her recent lab work does show elevated liver enzymes she has history of fatty liver.  Review of Systems  Constitutional: Negative for activity change and appetite change.  Gastrointestinal: Negative for abdominal pain and vomiting.  Neurological: Negative for weakness.  Psychiatric/Behavioral: Negative for confusion.       Objective:   Physical Exam  Constitutional: She appears well-nourished. No distress.  Cardiovascular: Normal rate, regular rhythm and normal heart sounds.   No murmur heard. Pulmonary/Chest: Effort normal and breath sounds normal. No respiratory distress.  Musculoskeletal: She exhibits no edema.  Lymphadenopathy:    She has no cervical adenopathy.  Neurological: She is alert. She exhibits normal muscle tone.  Psychiatric: Her behavior is normal.  Vitals reviewed.         Assessment & Plan:  Pain management-3 prescriptions were written. One was given today. Urine drug screen pending. Patient relates compliance with medicines and denies abusing or using other medicines.  ADD-new diagnosis-patient did have symptomatology when she was in middle school and high school. Patient did do screening testing which showed high probability of ADD. 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 We discussed various options including Strattera and Adderall discussed the  side effects including elevated blood pressure patient agrees to try Adderall extended release 15 mg daily she will follow-up in a proximally 4 weeks to see how this is doing.  Fatty liver patient will repeat liver profile and naproxen 3 months if it's not significantly improved she will need further testing. She was encouraged to exercise and lose weight

## 2016-05-26 ENCOUNTER — Encounter: Payer: Self-pay | Admitting: Family Medicine

## 2016-05-26 ENCOUNTER — Ambulatory Visit (INDEPENDENT_AMBULATORY_CARE_PROVIDER_SITE_OTHER): Payer: BLUE CROSS/BLUE SHIELD | Admitting: Family Medicine

## 2016-05-26 VITALS — BP 128/86 | Temp 98.2°F | Ht 63.0 in | Wt 189.0 lb

## 2016-05-26 DIAGNOSIS — K5732 Diverticulitis of large intestine without perforation or abscess without bleeding: Secondary | ICD-10-CM

## 2016-05-26 DIAGNOSIS — R109 Unspecified abdominal pain: Secondary | ICD-10-CM | POA: Diagnosis not present

## 2016-05-26 LAB — POCT URINALYSIS DIPSTICK: PH UA: 5

## 2016-05-26 MED ORDER — METRONIDAZOLE 500 MG PO TABS: 500.0000 mg | ORAL_TABLET | Freq: Two times a day (BID) | ORAL | 0 refills | Status: DC

## 2016-05-26 MED ORDER — CIPROFLOXACIN HCL 500 MG PO TABS: 500.0000 mg | ORAL_TABLET | Freq: Two times a day (BID) | ORAL | 0 refills | Status: DC

## 2016-05-26 MED ORDER — ONDANSETRON 8 MG PO TBDP: 8.0000 mg | ORAL_TABLET | Freq: Three times a day (TID) | ORAL | 0 refills | Status: DC | PRN

## 2016-05-26 MED ORDER — OXYCODONE-ACETAMINOPHEN 5-325 MG PO TABS: 1.0000 | ORAL_TABLET | ORAL | 0 refills | Status: DC | PRN

## 2016-05-26 NOTE — Progress Notes (Signed)
   Subjective:    Patient ID: Lisa Li, female    DOB: August 17, 1966, 50 y.o.   MRN: [id number]  Abdominal Pain  This is a new problem. Episode onset: 2 weeks ago. Associated symptoms include nausea. The pain is aggravated by eating. She has tried nothing for the symptoms.  Patient denies epigastric pain denies reflux symptoms is had her gallbladder out appendix out. Patient is also had colonoscopy but she is due for colonoscopy.    Review of Systems  Gastrointestinal: Positive for abdominal pain and nausea.  Patient does relate intermittent abdominal pain some nausea no vomiting no diarrhea no bloody stool.     Objective:   Physical Exam Neck no masses lungs clear no crackles heart is regular pulse normal abdomen soft no guarding or rebound some mild left mid and left lower quadrant tenderness no guarding rebound extremities no edema  Previous colonoscopy from 2012 reviewed.     Assessment & Plan:  Patient states she will get in touch with her gastroenterologist Dr. Collene Mares to set up colonoscopy she will let us know she needs our help  Probable diverticulitis Percocet for severe pain caution drowsiness in addition to this Cipro twice a day Flagyl 3 times daily should gradually see improvement over the course of the next couple days if severely worse aggressive troubles or problems call us immediately hold off on any type of CAT scan lab work ordered.

## 2016-05-27 LAB — CBC WITH DIFFERENTIAL/PLATELET
BASOS ABS: 0 10*3/uL (ref 0.0–0.2)
BASOS: 0 %
EOS (ABSOLUTE): 0.2 10*3/uL (ref 0.0–0.4)
Eos: 1 %
Hematocrit: 40.9 % (ref 34.0–46.6)
Hemoglobin: 13.2 g/dL (ref 11.1–15.9)
Immature Grans (Abs): 0 10*3/uL (ref 0.0–0.1)
Immature Granulocytes: 0 %
LYMPHS ABS: 3 10*3/uL (ref 0.7–3.1)
Lymphs: 21 %
MCH: 26.5 pg — AB (ref 26.6–33.0)
MCHC: 32.3 g/dL (ref 31.5–35.7)
MCV: 82 fL (ref 79–97)
Monocytes Absolute: 1.1 10*3/uL — ABNORMAL HIGH (ref 0.1–0.9)
Monocytes: 8 %
NEUTROS ABS: 10 10*3/uL — AB (ref 1.4–7.0)
Neutrophils: 70 %
Platelets: 214 10*3/uL (ref 150–379)
RBC: 4.98 x10E6/uL (ref 3.77–5.28)
RDW: 14.5 % (ref 12.3–15.4)
WBC: 14.4 10*3/uL — ABNORMAL HIGH (ref 3.4–10.8)

## 2016-05-27 LAB — LIPASE: Lipase: 43 U/L (ref 0–59)

## 2016-05-28 ENCOUNTER — Other Ambulatory Visit (HOSPITAL_COMMUNITY)
Admission: RE | Admit: 2016-05-28 | Discharge: 2016-05-28 | Disposition: A | Discharge status: Home / Self Care | Payer: BLUE CROSS/BLUE SHIELD | Source: Ambulatory Visit | Attending: Family Medicine | Admitting: Family Medicine

## 2016-05-28 ENCOUNTER — Encounter: Payer: Self-pay | Admitting: Family Medicine

## 2016-05-28 ENCOUNTER — Ambulatory Visit (INDEPENDENT_AMBULATORY_CARE_PROVIDER_SITE_OTHER): Payer: BLUE CROSS/BLUE SHIELD | Admitting: Family Medicine

## 2016-05-28 ENCOUNTER — Ambulatory Visit
Admission: RE | Admit: 2016-05-28 | Discharge: 2016-05-28 | Disposition: A | Discharge status: Home / Self Care | Payer: BLUE CROSS/BLUE SHIELD | Source: Ambulatory Visit | Attending: Family Medicine | Admitting: Family Medicine

## 2016-05-28 VITALS — BP 128/90 | Temp 98.2°F | Ht 63.0 in | Wt 186.0 lb

## 2016-05-28 DIAGNOSIS — K5732 Diverticulitis of large intestine without perforation or abscess without bleeding: Secondary | ICD-10-CM | POA: Diagnosis not present

## 2016-05-28 DIAGNOSIS — R109 Unspecified abdominal pain: Secondary | ICD-10-CM

## 2016-05-28 LAB — CBC WITH DIFFERENTIAL/PLATELET
BASOS ABS: 0 10*3/uL (ref 0.0–0.1)
BASOS PCT: 0 %
EOS ABS: 0.2 10*3/uL (ref 0.0–0.7)
Eosinophils Relative: 2 %
HCT: 41.1 % (ref 36.0–46.0)
HEMOGLOBIN: 13.1 g/dL (ref 12.0–15.0)
Lymphocytes Relative: 25 %
Lymphs Abs: 2.3 10*3/uL (ref 0.7–4.0)
MCH: 26.3 pg (ref 26.0–34.0)
MCHC: 31.9 g/dL (ref 30.0–36.0)
MCV: 82.5 fL (ref 78.0–100.0)
MONOS PCT: 7 %
Monocytes Absolute: 0.6 10*3/uL (ref 0.1–1.0)
NEUTROS PCT: 66 %
Neutro Abs: 5.8 10*3/uL (ref 1.7–7.7)
Platelets: 200 10*3/uL (ref 150–400)
RBC: 4.98 MIL/uL (ref 3.87–5.11)
RDW: 13.9 % (ref 11.5–15.5)
WBC: 8.9 10*3/uL (ref 4.0–10.5)

## 2016-05-28 LAB — POCT URINALYSIS DIPSTICK
Glucose, UA: 50
PH UA: 5
Spec Grav, UA: 1.015

## 2016-05-28 LAB — TOXASSURE SELECT 13 (MW), URINE: PDF: 0

## 2016-05-28 LAB — PLEASE NOTE

## 2016-05-28 MED ORDER — IOHEXOL 300 MG/ML  SOLN
30.0000 mL | Freq: Once | INTRAMUSCULAR | Status: AC | PRN
  Administered 2016-05-28: 30 mL via ORAL

## 2016-05-28 MED ORDER — IOPAMIDOL (ISOVUE-300) INJECTION 61%
100.0000 mL | Freq: Once | INTRAVENOUS | Status: AC | PRN
  Administered 2016-05-28: 100 mL via INTRAVENOUS

## 2016-05-28 NOTE — Progress Notes (Signed)
   Subjective:    Patient ID: Lisa Li, female    DOB: June 16, 1966, 50 y.o.   MRN: [id number]  Abdominal Pain  This is a new problem. Episode onset: 2 weeks ago. Associated symptoms include nausea. Associated symptoms comments: chills. Treatments tried: cipro, flagyl.   This patient was just here 2 days ago. Having severe abdominal pain as well as nausea and poor appetite along with fever and chills. On clinical exam at that time it was felt she had diverticulitis although we could not rule out abscess. Today she relates seeing blood in her urine once but denies flank pain. She also states ongoing chills but no fevers. She is taking the antibiotics for 2 days but has not seen an improvement in her pain. Patient has had her appendix taken out as well as her gallbladder 2 days ago her white blood count was 16,000 Review of Systems  Gastrointestinal: Positive for abdominal pain and nausea.       Objective:   Physical Exam  Lungs are clear hearts regular abdomen significant pain and discomfort with mild guarding on the left mid abdomen and left lower abdomen. There is point tenderness along the left midabdomen. Extremities no edema      Assessment & Plan:  Severe abdominal pain lack of improvement with antibiotics patient with chills in addition to this has significant point tenderness and mild guarding as well as documented elevated white blood count.  This patient needs stat CBC she also needs stat CT scan of the abdomen await the findings. May need referral to the hospital for admission.

## 2016-05-29 ENCOUNTER — Other Ambulatory Visit: Payer: Self-pay

## 2016-05-29 DIAGNOSIS — K5732 Diverticulitis of large intestine without perforation or abscess without bleeding: Secondary | ICD-10-CM

## 2016-06-02 ENCOUNTER — Telehealth: Payer: Self-pay | Admitting: Family Medicine

## 2016-06-02 NOTE — Telephone Encounter (Signed)
Pt called stating that she doesn't feel like she can go back to work yet. Pt finished the Flagyl last night and is still taking the Cipro. Pt states that she feels better than she did last week and that her pain level is around 2 or 3. Pt states that her appetite is not at 100% but it is better, she has no fever and no chills and her bowel movements still look funny. Pt is needing a note to take her out of work today, wed and thurs. Please advise.

## 2016-06-02 NOTE — Telephone Encounter (Signed)
Spoke with patient and informed her per Dr.Scott Luking- Please give work excuse for the next 3 days. She should go be able to gradually get back to being 100%. If not 100% by next week patient is to notify us. Patient verbalized understanding.

## 2016-06-02 NOTE — Telephone Encounter (Signed)
Please give work excuse for the next 3 days. She should go be able to gradually get back to being 100%. If not 100% by next week patient is to notify us

## 2016-06-16 ENCOUNTER — Ambulatory Visit: Payer: BLUE CROSS/BLUE SHIELD | Admitting: Obstetrics & Gynecology

## 2016-06-17 ENCOUNTER — Ambulatory Visit (INDEPENDENT_AMBULATORY_CARE_PROVIDER_SITE_OTHER): Payer: BLUE CROSS/BLUE SHIELD | Admitting: Family Medicine

## 2016-06-17 ENCOUNTER — Encounter: Payer: Self-pay | Admitting: Family Medicine

## 2016-06-17 VITALS — BP 130/78 | Ht 63.0 in | Wt 188.6 lb

## 2016-06-17 DIAGNOSIS — F909 Attention-deficit hyperactivity disorder, unspecified type: Secondary | ICD-10-CM

## 2016-06-17 DIAGNOSIS — F988 Other specified behavioral and emotional disorders with onset usually occurring in childhood and adolescence: Secondary | ICD-10-CM

## 2016-06-17 MED ORDER — AMPHETAMINE-DEXTROAMPHET ER 20 MG PO CP24: 20.0000 mg | ORAL_CAPSULE | ORAL | 0 refills | Status: DC

## 2016-06-17 NOTE — Progress Notes (Signed)
   Subjective:    Patient ID: Lisa Li, female    DOB: February 05, 1966, 50 y.o.   MRN: [id number]  HPI  Patient arrives for a follow up on ADHD med. Patient states the med is working but not lasting long enough. Patient would also like a script written out for dukes magic mouthwash Patient states ADD medicine helps but typically it runs out of strength by middle of the afternoon she would like to go up on the strength she denies any problems with appetite sleeping or problems with mood changes. She will be seeing gastroenterology tomorrow to get her colonoscopy set patient recently treated for diverticulitis and also had abnormal CT scan Review of Systems     Objective:   Physical Exam  Lungs clear heart regular blood pressure good pulse normal no tremor      Assessment & Plan:  ADD-patient states medication helping but not quite enough we will increase the dose she will follow her blood pressure she will follow-up for her standard pain management visit in 2 months. If she needs adjustment of the medicine she will let us know.  Patient will be seen the intestinal doctor tomorrow more than likely will need a colonoscopy. A copy of the CAT scan report was faxed to Dr. Collene Mares  The patient relates that she has family members cousins with liver dysfunction and liver disease she will find out regarding any genetic information in relay this back to [place].  Prescription for [employer] magics mouthwash prescribed she uses this for mouth sores when they occur

## 2016-06-23 ENCOUNTER — Encounter: Payer: Self-pay | Admitting: Family Medicine

## 2016-06-24 ENCOUNTER — Other Ambulatory Visit: Payer: Self-pay

## 2016-06-24 DIAGNOSIS — R109 Unspecified abdominal pain: Secondary | ICD-10-CM

## 2016-06-24 DIAGNOSIS — R5383 Other fatigue: Secondary | ICD-10-CM

## 2016-06-24 NOTE — Telephone Encounter (Signed)
Ellenmarie, I think it would be reasonable to do some blood work. I will have our nursing staff but the orders in. If you get it drawn on Thursday we will have results on Friday. Certainly if your pain starts becoming severe you need to call us and let us know that may need to put you on antibiotics currently right now I would recommend holding off on antibiotics. Colonoscopy certainly a good idea. You may end up having to see Dr. Collene Mares for an office visit before that if these abdominal symptoms continue. I would not recommend a scan based on what I'm hearing so far. As for the fatigue and sleepiness I don't know if that is related to this or not at this point. Let's have you do the blood work I will see the results on Friday and my nurses will give your call regarding the results on Friday thank you  Nurse's-please order CBC liver lipase

## 2016-06-26 LAB — CBC WITH DIFFERENTIAL/PLATELET
BASOS: 0 %
Basophils Absolute: 0 10*3/uL (ref 0.0–0.2)
EOS (ABSOLUTE): 0.2 10*3/uL (ref 0.0–0.4)
EOS: 2 %
HEMATOCRIT: 39.3 % (ref 34.0–46.6)
Hemoglobin: 13.2 g/dL (ref 11.1–15.9)
IMMATURE GRANULOCYTES: 0 %
Immature Grans (Abs): 0 10*3/uL (ref 0.0–0.1)
LYMPHS ABS: 2.6 10*3/uL (ref 0.7–3.1)
Lymphs: 38 %
MCH: 26.9 pg (ref 26.6–33.0)
MCHC: 33.6 g/dL (ref 31.5–35.7)
MCV: 80 fL (ref 79–97)
MONOS ABS: 0.4 10*3/uL (ref 0.1–0.9)
Monocytes: 6 %
NEUTROS PCT: 54 %
Neutrophils Absolute: 3.6 10*3/uL (ref 1.4–7.0)
PLATELETS: 173 10*3/uL (ref 150–379)
RBC: 4.91 x10E6/uL (ref 3.77–5.28)
RDW: 14.5 % (ref 12.3–15.4)
WBC: 6.8 10*3/uL (ref 3.4–10.8)

## 2016-06-26 LAB — HEPATIC FUNCTION PANEL
ALT: 64 IU/L — AB (ref 0–32)
AST: 43 IU/L — AB (ref 0–40)
Albumin: 4.4 g/dL (ref 3.5–5.5)
Alkaline Phosphatase: 43 IU/L (ref 39–117)
BILIRUBIN TOTAL: 0.3 mg/dL (ref 0.0–1.2)
Bilirubin, Direct: 0.09 mg/dL (ref 0.00–0.40)
Total Protein: 7 g/dL (ref 6.0–8.5)

## 2016-06-26 LAB — LIPASE: Lipase: 56 U/L (ref 0–59)

## 2016-07-06 ENCOUNTER — Other Ambulatory Visit: Payer: Self-pay | Admitting: Obstetrics & Gynecology

## 2016-07-06 DIAGNOSIS — Z1231 Encounter for screening mammogram for malignant neoplasm of breast: Secondary | ICD-10-CM

## 2016-07-07 ENCOUNTER — Ambulatory Visit
Admission: RE | Admit: 2016-07-07 | Discharge: 2016-07-07 | Disposition: A | Discharge status: Home / Self Care | Payer: BLUE CROSS/BLUE SHIELD | Source: Ambulatory Visit | Attending: Obstetrics & Gynecology | Admitting: Obstetrics & Gynecology

## 2016-07-07 DIAGNOSIS — Z1231 Encounter for screening mammogram for malignant neoplasm of breast: Secondary | ICD-10-CM

## 2016-07-14 ENCOUNTER — Ambulatory Visit (INDEPENDENT_AMBULATORY_CARE_PROVIDER_SITE_OTHER): Payer: BLUE CROSS/BLUE SHIELD | Admitting: Obstetrics & Gynecology

## 2016-07-14 ENCOUNTER — Encounter: Payer: Self-pay | Admitting: Obstetrics & Gynecology

## 2016-07-14 VITALS — BP 142/86 | HR 80 | Resp 14 | Ht 63.0 in | Wt 187.2 lb

## 2016-07-14 DIAGNOSIS — Z01419 Encounter for gynecological examination (general) (routine) without abnormal findings: Secondary | ICD-10-CM | POA: Diagnosis not present

## 2016-07-14 DIAGNOSIS — R748 Abnormal levels of other serum enzymes: Secondary | ICD-10-CM | POA: Diagnosis not present

## 2016-07-14 DIAGNOSIS — N959 Unspecified menopausal and perimenopausal disorder: Secondary | ICD-10-CM

## 2016-07-14 LAB — ESTRADIOL: Estradiol: 72 pg/mL

## 2016-07-14 MED ORDER — ESTRADIOL 0.1 MG/24HR TD PTTW: 1.0000 | MEDICATED_PATCH | TRANSDERMAL | 4 refills | Status: DC

## 2016-07-14 NOTE — Addendum Note (Signed)
Addended by: Megan Salon on: 07/14/2016 04:25 PM   Modules accepted: Orders

## 2016-07-14 NOTE — Patient Instructions (Addendum)
[facility].  Amy Johnnye Sima.

## 2016-07-14 NOTE — Addendum Note (Signed)
Addended by: Karmen Bongo T on: 07/14/2016 04:26 PM   Modules accepted: Orders

## 2016-07-14 NOTE — Progress Notes (Signed)
50 y.o. G0P0 Single CaucasianF here for annual exam.  Doing well.    Working full time at [employer] and part time at [employer] and [employer].    Denies vaginal bleeding.    Had episode of diverticulitis in August.  CT showed thickened along mid descending colon.  Follow up colonoscopy recommended.  She does have this scheduled.  Patient's last menstrual period was 05/31/2013.          Sexually active: No.  The current method of family planning is status post hysterectomy.    Exercising: No.  The patient does not participate in regular exercise at present. Smoker:  no  Health Maintenance: Pap:  2013 negative  History of abnormal Pap:  no MMG:  07/09/16 BIRADS 1 negative  Colonoscopy: 02/20/11 polyp repeat 5 years.  Follow up colonoscopy is scheduled for late October with Dr. Collene Mares.  BMD:  2011 normal per patient  TDaP:  03/12/12  Pneumonia vaccine(s): 06/19/14  Zostavax:   never Hep C testing: not indicated  Screening Labs: discuss with provider, Hb today: discuss with provider, Urine today: unable to void at this time   reports that she has never smoked. She has never used smokeless tobacco. She reports that she does not drink alcohol or use drugs.  Past Medical History:  Diagnosis Date  . Frequent UTI   . Kidney stone   . MGUS (monoclonal gammopathy of unknown significance)   . Migraine headache   . Myalgia    Chronic  . Pre-diabetes     Past Surgical History:  Procedure Laterality Date  . APPENDECTOMY    . CHOLECYSTECTOMY    . COLONOSCOPY W/ ENDOSCOPIC [place]  09/27/06   02/2011   . CYSTOSCOPY N/A 07/03/2013   Procedure: CYSTOSCOPY;  Surgeon: Lyman Speller, MD;  Location: [facility];  Service: Gynecology;  Laterality: N/A;  . ESOPHAGOGASTRODUODENOSCOPY    . ROBOTIC ASSISTED TOTAL HYSTERECTOMY WITH BILATERAL SALPINGO OOPHERECTOMY Bilateral 07/03/2013   Procedure: ROBOTIC ASSISTED TOTAL HYSTERECTOMY WITH BILATERAL SALPINGO OOPHORECTOMY;  Surgeon: Lyman Speller, MD;   Location: [facility];  Service: Gynecology;  Laterality: Bilateral;  . TONSILLECTOMY  1973    Current Outpatient Prescriptions  Medication Sig Dispense Refill  . amphetamine-dextroamphetamine (ADDERALL XR) 20 MG 24 hr capsule Take 1 capsule (20 mg total) by mouth every morning. 30 capsule 0  . ciprofloxacin (CIPRO) 500 MG tablet Take 1 tablet (500 mg total) by mouth 2 (two) times daily. 20 tablet 0  . diazepam (VALIUM) 5 MG tablet TAKE 1 TABLET AT BEDTIME AS NEEDED. 30 tablet 5  . diclofenac sodium (VOLTAREN) 1 % GEL Apply 4 g topically 4 (four) times daily. 1 Tube 3  . docusate sodium (COLACE) 100 MG capsule Take 100 mg by mouth as needed.     Marland Kitchen FLUoxetine (PROZAC) 10 MG tablet Take 1 tablet (10 mg total) by mouth daily. 30 tablet 5  . HYDROcodone-acetaminophen (NORCO/VICODIN) 5-325 MG tablet Take 1 tablet by mouth 3 (three) times daily as needed. 90 tablet 0  . lidocaine (XYLOCAINE JELLY) 2 % jelly Apply 1 application topically as needed. 30 mL 0  . lidocaine (XYLOCAINE) 5 % ointment Apply 1 application topically as needed. 35.44 g 5  . loratadine (CLARITIN) 10 MG tablet Take 10 mg by mouth daily.    . meloxicam (MOBIC) 15 MG tablet TAKE 1 TABLET ONCE DAILY. DO NOT USE ON THE SAME DAY AS NAPROXEN. 30 tablet 6  . metFORMIN (GLUCOPHAGE-XR) 500 MG 24 hr tablet TAKE (  1) TABLET BY MOUTH EACH MORNING. 30 tablet 5  . metroNIDAZOLE (FLAGYL) 500 MG tablet Take 1 tablet (500 mg total) by mouth 2 (two) times daily with a meal. 14 tablet 0  . MINIVELLE 0.1 MG/24HR patch PLACE 1 PATCH ONTO THE SKIN 2 TIMES A WEEK 24 patch 0  . omeprazole (PRILOSEC) 40 MG capsule Take 1 capsule (40 mg total) by mouth daily. 30 capsule 5  . ondansetron (ZOFRAN ODT) 8 MG disintegrating tablet Take 1 tablet (8 mg total) by mouth every 8 (eight) hours as needed for nausea or vomiting. 20 tablet 0  . oxyCODONE-acetaminophen (ROXICET) 5-325 MG tablet Take 1 tablet by mouth every 4 (four) hours as needed for severe pain. 30 tablet 0   . pregabalin (LYRICA) 50 MG capsule Take 1 capsule (50 mg total) by mouth 2 (two) times daily. 60 capsule 2  . propranolol (INDERAL) 10 MG tablet TAKE 2 TABLETS TWICE A DAY AS NEEDED. 60 tablet 5  . triamcinolone (NASACORT) 55 MCG/ACT AERO nasal inhaler USE 1 SPRAY IN EACH NOSTRIL DAILY. 1 Inhaler 5   No current facility-administered medications for this visit.     Family History  Problem Relation Age of Onset  . Prostate cancer Father     Deceased, 73  . COPD Mother     Deceased, 31  . Emphysema Sister   . Healthy Brother   . Liver cancer Other     and pancreatic cancer-paternal cousin  . Liver cancer Other     paternal cousin    ROS:  Pertinent items are noted in HPI.  Otherwise, a comprehensive ROS was negative.  Exam:   Vitals:   07/14/16 1400  BP: (!) 142/86  Pulse: 80  Resp: 14    General appearance: alert, cooperative and appears stated age Head: Normocephalic, without obvious abnormality, atraumatic Neck: no adenopathy, supple, symmetrical, trachea midline and thyroid normal to inspection and palpation Lungs: clear to auscultation bilaterally Breasts: normal appearance, no masses or tenderness Heart: regular rate and rhythm Abdomen: soft, non-tender; bowel sounds normal; no masses,  no organomegaly Extremities: extremities normal, atraumatic, no cyanosis or edema Skin: Skin color, texture, turgor normal. No rashes or lesions Lymph nodes: Cervical, supraclavicular, and axillary nodes normal. No abnormal inguinal nodes palpated Neurologic: Grossly normal  Pelvic: External genitalia:  no lesions              Urethra:  normal appearing urethra with no masses, tenderness or lesions              Bartholins and Skenes: normal                 Vagina: normal appearing vagina with normal color and discharge, no lesions              Cervix: absent              Pap taken: No. Bimanual Exam:  Uterus:  uterus absent              Adnexa: normal adnexa and no mass,  fullness, tenderness               Rectovaginal: Confirms               Anus:  normal sphincter tone, no lesions  Chaperone was present for exam.  A:  Well Woman with normal exam Type 2 Diabetes, controlled ADHD  Migraines GERD, gastric polyps, IBS, fatty liver disease, elevated liver enzymes H/O TLH/BSO/cystoscopy 9/14  P:  Mammogram yearly.  pap smear not indicated Estradiol 0.1mg  patches twice weekly. 24 patches/4RF. Estradiol level today Hepatitis testing done today as well Return annually or prn

## 2016-07-15 LAB — HEPATITIS PANEL, ACUTE
HCV AB: NEGATIVE
HEP A IGM: NONREACTIVE
HEP B C IGM: NONREACTIVE
Hepatitis B Surface Ag: NEGATIVE

## 2016-08-05 ENCOUNTER — Encounter: Payer: Self-pay | Admitting: Family Medicine

## 2016-08-10 ENCOUNTER — Other Ambulatory Visit: Payer: Self-pay | Admitting: Family Medicine

## 2016-08-14 ENCOUNTER — Telehealth: Payer: Self-pay | Admitting: Family Medicine

## 2016-08-14 NOTE — Telephone Encounter (Signed)
Please review surgical pathology report from [facility].

## 2016-08-16 NOTE — Telephone Encounter (Signed)
This was a biopsy on stomach polyps by her gastroenterologist Dr. Mayo Ao cancer. No H. pylori

## 2016-08-17 ENCOUNTER — Encounter: Payer: Self-pay | Admitting: Family Medicine

## 2016-08-17 ENCOUNTER — Ambulatory Visit (INDEPENDENT_AMBULATORY_CARE_PROVIDER_SITE_OTHER): Payer: BLUE CROSS/BLUE SHIELD | Admitting: Family Medicine

## 2016-08-17 VITALS — BP 128/82 | Ht 63.0 in | Wt 186.6 lb

## 2016-08-17 DIAGNOSIS — F988 Other specified behavioral and emotional disorders with onset usually occurring in childhood and adolescence: Secondary | ICD-10-CM | POA: Diagnosis not present

## 2016-08-17 DIAGNOSIS — Z79899 Other long term (current) drug therapy: Secondary | ICD-10-CM

## 2016-08-17 DIAGNOSIS — E1140 Type 2 diabetes mellitus with diabetic neuropathy, unspecified: Secondary | ICD-10-CM | POA: Diagnosis not present

## 2016-08-17 DIAGNOSIS — D472 Monoclonal gammopathy: Secondary | ICD-10-CM

## 2016-08-17 DIAGNOSIS — K5732 Diverticulitis of large intestine without perforation or abscess without bleeding: Secondary | ICD-10-CM | POA: Diagnosis not present

## 2016-08-17 DIAGNOSIS — E119 Type 2 diabetes mellitus without complications: Secondary | ICD-10-CM | POA: Diagnosis not present

## 2016-08-17 DIAGNOSIS — E785 Hyperlipidemia, unspecified: Secondary | ICD-10-CM | POA: Diagnosis not present

## 2016-08-17 MED ORDER — METRONIDAZOLE 500 MG PO TABS: 500.0000 mg | ORAL_TABLET | Freq: Three times a day (TID) | ORAL | 0 refills | Status: DC

## 2016-08-17 MED ORDER — AMPHETAMINE-DEXTROAMPHET ER 30 MG PO CP24
30.0000 mg | ORAL_CAPSULE | ORAL | 0 refills | Status: DC
Start: 2016-08-17 — End: 2016-08-17

## 2016-08-17 MED ORDER — AMPHETAMINE-DEXTROAMPHET ER 30 MG PO CP24: 30.0000 mg | ORAL_CAPSULE | ORAL | 0 refills | Status: DC

## 2016-08-17 MED ORDER — HYDROCODONE-ACETAMINOPHEN 5-325 MG PO TABS: 1.0000 | ORAL_TABLET | Freq: Three times a day (TID) | ORAL | 0 refills | Status: DC | PRN

## 2016-08-17 MED ORDER — CIPROFLOXACIN HCL 500 MG PO TABS: 500.0000 mg | ORAL_TABLET | Freq: Two times a day (BID) | ORAL | 0 refills | Status: DC

## 2016-08-17 MED ORDER — HYDROCODONE-ACETAMINOPHEN 10-325 MG PO TABS: 1.0000 | ORAL_TABLET | Freq: Four times a day (QID) | ORAL | 0 refills | Status: DC | PRN

## 2016-08-17 NOTE — Progress Notes (Signed)
Subjective:    Patient ID: Lisa Li, female    DOB: 11/18/1965, 50 y.o.   MRN: [id number]  HPI  This patient was seen today for chronic pain  The medication list was reviewed and updated.   -Compliance with medication: yes  - Number patient states they take daily: hydrocodone-2 to 3 a day on average  -when was the last dose patient took? today  The patient was advised the importance of maintaining medication and not using illegal substances with these.  Refills needed: yes  The patient was educated that we can provide 3 monthly scripts for their medication, it is their responsibility to follow the instructions.  Side effects or complications from medications: none  Patient is aware that pain medications are meant to minimize the severity of the pain to allow their pain levels to improve to allow for better function. They are aware of that pain medications cannot totally remove their pain.  Due for UDT ( at least once per year) : 9/17   Patient also needs follow up on ADHD medications and to discuss recent lab work    Patient also having flare of diverticulitis patient states she had colonoscopy and endoscopy on October 25  long discussion held patient having some lower abdominal tenderness denies high fever chills sweats denies nausea vomiting diarrhea relates moderate amount of pain and discomfort.   Review of Systems  Constitutional: Negative for activity change and appetite change.  Gastrointestinal: Positive for abdominal pain and nausea. Negative for vomiting.  Neurological: Negative for weakness.  Psychiatric/Behavioral: Negative for confusion.       Objective:   Physical Exam  Constitutional: She appears well-nourished. No distress.  HENT:  Head: Normocephalic.  Cardiovascular: Normal rate, regular rhythm and normal heart sounds.   No murmur heard. Pulmonary/Chest: Effort normal and breath sounds normal.  Abdominal: Soft. She exhibits no distension.  There is tenderness. There is no rebound and no guarding.  Musculoskeletal: She exhibits no edema.  Lymphadenopathy:    She has no cervical adenopathy.  Neurological: She is alert.  Psychiatric: Her behavior is normal.  Vitals reviewed. This patient does have painful diabetic neuropathy. She takes her pain medicine for this. It does a good job of keeping it under control. She denies abusing it She does have diabetes overall it's been under good control she does try to watch her diet she does need to try to lose some weight increase exercise and activity recommended She does have ADD the medication does help her keep focus. She denies any side effects with the medicine. 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 She may need to be referred back to the specialists if these tests are abnormal.   25 minutes was spent with the patient. Greater than half the time was spent in discussion and answering questions and counseling regarding the issues that the patient came in for today.   Patient was told she use hydrocodone 10 mg for severe pain with the diverticulitis once that is doing better over the next 5-6 days she is to go back to her 5 mg tablet caution drowsiness call us if any problems.    Assessment & Plan:  1. Painful diabetic neuropathy (HCC)  pain medication prescribed patient  takes it responsibly does not get drowsy with it. 3 prescriptions given.  2. Controlled type 2 diabetes mellitus without complication, without long-term current use of insulin ([place])  Patient will check her A1c watch diet continue medication - Hemoglobin A1c  3. Attention deficit disorder, unspecified hyperactivity presence  does well on ADD medicines but the  dosage is not helping enough increase the dosage to 30 mg follow-up 3 months  4. Diverticulitis of colon  probable diverticulitis of the colon. Go ahead and treat with Cipro and Flagyl. If not improving over the course of next 4872 hours patient will call we will set up for labs and scan patient did not 1 have any additional scanning at this time  5. MGUS (monoclonal gammopathy of unknown significance)  this patient was diagnosed with this many years ago. Was seen a specialist she requests [place] to do follow-up lab work regarding this. She denies any problems currently - Serum protein electrophoresis with reflex - KAPPA/LAMBDA LIGHT CHAINS, FREE, WITH RATIO, 24HR. URINE - IgG, IgA, IgM  6. High risk medication use  because of her medications check liver profile - Hepatic function panel  7. Hyperlipidemia, unspecified hyperlipidemia type  watch diet closely check lipid previous labs reviewed - Lipid panel

## 2016-09-10 LAB — LIPID PANEL
CHOL/HDL RATIO: 5.5 ratio — AB (ref 0.0–4.4)
Cholesterol, Total: 237 mg/dL — ABNORMAL HIGH (ref 100–199)
HDL: 43 mg/dL (ref >39)
LDL CALC: 153 mg/dL — AB (ref 0–99)
TRIGLYCERIDES: 206 mg/dL — AB (ref 0–149)
VLDL Cholesterol Cal: 41 mg/dL — ABNORMAL HIGH (ref 5–40)

## 2016-09-10 LAB — HEPATIC FUNCTION PANEL
ALBUMIN: 4.4 g/dL (ref 3.5–5.5)
ALT: 47 IU/L — ABNORMAL HIGH (ref 0–32)
AST: 36 IU/L (ref 0–40)
Alkaline Phosphatase: 45 IU/L (ref 39–117)
BILIRUBIN TOTAL: 0.3 mg/dL (ref 0.0–1.2)
Bilirubin, Direct: 0.09 mg/dL (ref 0.00–0.40)
TOTAL PROTEIN: 7.2 g/dL (ref 6.0–8.5)

## 2016-09-10 LAB — HEMOGLOBIN A1C
Est. average glucose Bld gHb Est-mCnc: 146 mg/dL
HEMOGLOBIN A1C: 6.7 % — AB (ref 4.8–5.6)

## 2016-09-11 ENCOUNTER — Other Ambulatory Visit: Payer: Self-pay | Admitting: Family Medicine

## 2016-09-11 DIAGNOSIS — C9000 Multiple myeloma not having achieved remission: Secondary | ICD-10-CM

## 2016-09-14 ENCOUNTER — Other Ambulatory Visit: Payer: Self-pay

## 2016-09-14 LAB — PROTEIN ELECTROPHORESIS, SERUM, WITH REFLEX
A/G RATIO SPE: 1.5 (ref 0.7–1.7)
ALBUMIN ELP: 4.4 g/dL (ref 2.9–4.4)
ALPHA 2: 0.7 g/dL (ref 0.4–1.0)
Alpha 1: 0.2 g/dL (ref 0.0–0.4)
BETA: 1.1 g/dL (ref 0.7–1.3)
GAMMA GLOBULIN: 0.9 g/dL (ref 0.4–1.8)
GLOBULIN, TOTAL: 2.9 g/dL (ref 2.2–3.9)
Interpretation(See Below): 0
M-Spike, %: 0.5 g/dL — ABNORMAL HIGH
Total Protein: 7.3 g/dL (ref 6.0–8.5)

## 2016-09-14 LAB — KAPPA/LAMBDA LIGHT CHAINS, FREE, WITH RATIO, 24HR. URINE
FR KAPPA LT CH,24HR: 8 mg/24 hr
FR LAMBDA LT CH,24HR: 1 mg/24 hr
FREE LAMBDA LT CHAINS, UR: 0.44 mg/L (ref 0.24–6.66)
Free Kappa Lt Chains,Ur: 6.36 mg/L (ref 1.35–24.19)
Kappa/Lambda Ratio,U: 14.45 — ABNORMAL HIGH (ref 2.04–10.37)

## 2016-09-14 LAB — IGG, IGA, IGM
IGM (IMMUNOGLOBULIN M), SRM: 62 mg/dL (ref 26–217)
IgA/Immunoglobulin A, Serum: 200 mg/dL (ref 87–352)
IgG (Immunoglobin G), Serum: 934 mg/dL (ref 700–1600)

## 2016-09-14 LAB — IMMUNOFIXATION REFLEX, SERUM

## 2016-09-14 MED ORDER — ROSUVASTATIN CALCIUM 5 MG PO TABS: 5.0000 mg | ORAL_TABLET | Freq: Every day | ORAL | 4 refills | Status: DC

## 2016-09-17 ENCOUNTER — Other Ambulatory Visit: Payer: Self-pay | Admitting: Family Medicine

## 2016-10-01 ENCOUNTER — Encounter: Payer: Self-pay | Admitting: Pediatrics

## 2016-10-08 ENCOUNTER — Other Ambulatory Visit: Payer: Self-pay | Admitting: *Deleted

## 2016-10-08 DIAGNOSIS — Z79899 Other long term (current) drug therapy: Secondary | ICD-10-CM

## 2016-10-08 DIAGNOSIS — E785 Hyperlipidemia, unspecified: Secondary | ICD-10-CM

## 2016-10-08 NOTE — Addendum Note (Signed)
Addended by: Carmelina Noun on: 10/08/2016 08:59 AM   Modules accepted: Orders

## 2016-10-19 ENCOUNTER — Telehealth: Payer: Self-pay | Admitting: Oncology

## 2016-10-19 NOTE — Telephone Encounter (Signed)
Pt returned call, scheduled appt with Dr Alen Blew per pt request.  She has started a new job and will call back to confirm appt date/time given.

## 2016-11-03 ENCOUNTER — Other Ambulatory Visit: Payer: Self-pay | Admitting: Family Medicine

## 2016-11-04 NOTE — Telephone Encounter (Signed)
Last rx'd 07/2015, not addressed at 11/17 OV

## 2016-11-06 ENCOUNTER — Ambulatory Visit (HOSPITAL_BASED_OUTPATIENT_CLINIC_OR_DEPARTMENT_OTHER): Payer: PRIVATE HEALTH INSURANCE | Admitting: Oncology

## 2016-11-06 ENCOUNTER — Telehealth: Payer: Self-pay | Admitting: Oncology

## 2016-11-06 VITALS — BP 138/80 | HR 84 | Temp 98.2°F | Resp 18 | Ht 63.0 in | Wt 184.3 lb

## 2016-11-06 DIAGNOSIS — D472 Monoclonal gammopathy: Secondary | ICD-10-CM

## 2016-11-06 DIAGNOSIS — M791 Myalgia: Secondary | ICD-10-CM | POA: Diagnosis not present

## 2016-11-06 NOTE — Telephone Encounter (Signed)
Appointments scheduled per 11/06/16 los. Patient was given a copy of the AVS report and appointment schedule per 11/06/16 los.  ° °

## 2016-11-06 NOTE — Progress Notes (Signed)
Reason for Referral: Monoclonal gammopathy.   HPI: 51 year old woman currently of [place], [place]. She has a past medical history significant for diabetes and chronic myalgias. Her chronic pain started close to 10 years ago and described as achy and throughout her bones. Her evaluation at that time including evaluation by neurology and at that time she was noted to have an IgG monoclonal protein. She was evaluated by hematology oncology by Dr. Jamse Arn at that time.  Her serum protein electrophoresis at that time showed an M spike of around 0.28 g/dL. Immunofixation showed an IgG kappa subtype. No end organ damage is noted within normal kidney function, electrolytes and calcium. She had normal CBC at that time. Skeletal survey did not reveal any lesions. She has not followed routinely since 2013 regarding this issue. She will repeat serum protein electrophoresis on 09/09/2016. Her M spike was 0.5 g/dL with normal quantitative immunoglobulins. She had normal CBC and chemistries. She wanted to reestablish care with oncology and was referred back to me. Clinically, she is a symptomatic. She continues to have chronic myalgias which have not dramatically changed. She denied any pathological fractures or recurrent infections. She continues to perform activities of daily living without any decline. 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  She does not report any headaches, blurry vision, syncope or seizures. She has not reported any fevers, chills, sweats or weight loss. She is not reporting any chest pain, palpitation, orthopnea or leg edema. She does not report any cough, wheezing or hemoptysis. She does not report any nausea, vomiting or abdominal pain. She does not report any frequency urgency or hesitancy. She does not report any skeletal complaints. Remaining review of systems unremarkable.   Past Medical History:  Diagnosis Date  . Frequent UTI   . Kidney stone   . Meniere's disease 2017    Dr. Benjamine Mola   . MGUS (monoclonal gammopathy of unknown significance)   . Migraine headache   . Myalgia    Chronic  . Pre-diabetes   :  Past Surgical History:  Procedure Laterality Date  . APPENDECTOMY    . CHOLECYSTECTOMY    . COLONOSCOPY W/ ENDOSCOPIC [place]  09/27/06   02/2011   . CYSTOSCOPY N/A 07/03/2013   Procedure: CYSTOSCOPY;  Surgeon: Lyman Speller, MD;  Location: [facility];  Service: Gynecology;  Laterality: N/A;  . ESOPHAGOGASTRODUODENOSCOPY    . ROBOTIC ASSISTED TOTAL HYSTERECTOMY WITH BILATERAL SALPINGO OOPHERECTOMY Bilateral 07/03/2013   Procedure: ROBOTIC ASSISTED TOTAL HYSTERECTOMY WITH BILATERAL SALPINGO OOPHORECTOMY;  Surgeon: Lyman Speller, MD;  Location: [facility];  Service: Gynecology;  Laterality: Bilateral;  . TONSILLECTOMY  1973  :   Current Outpatient Prescriptions:  .  amphetamine-dextroamphetamine (ADDERALL XR) 30 MG 24 hr capsule, Take 1 capsule (30 mg total) by mouth every morning., Disp: 30 capsule, Rfl: 0 .  diazepam (VALIUM) 5 MG tablet, TAKE 1 TABLET AT BEDTIME AS NEEDED., Disp: 30 tablet, Rfl: 5 .  diclofenac sodium (VOLTAREN) 1 % GEL, Apply 4 g topically 4 (four) times daily., Disp: 1 Tube, Rfl: 3 .  docusate sodium (COLACE) 100 MG capsule, Take 100 mg by mouth as needed. , Disp: , Rfl:  .  estradiol (MINIVELLE) 0.1 MG/24HR patch, Place 1 patch (0.1 mg total) onto the skin 2 (two) times a week., Disp: 24 patch, Rfl: 4 .  FLUoxetine (PROZAC) 10 MG tablet, TAKE ONE TABLET BY MOUTH ONCE DAILY., Disp: 30 tablet, Rfl: 0 .  HYDROcodone-acetaminophen (NORCO) 10-325 MG tablet, Take  1 tablet by mouth every 6 (six) hours as needed., Disp: 30 tablet, Rfl: 0 .  HYDROcodone-acetaminophen (NORCO/VICODIN) 5-325 MG tablet, Take 1 tablet by mouth 3 (three) times daily as needed., Disp: 90 tablet, Rfl: 0 .  lidocaine (XYLOCAINE JELLY) 2 % jelly, Apply 1 application topically as needed., Disp: 30 mL, Rfl: 0 .  lidocaine (XYLOCAINE) 5 % ointment, Apply 1 application  topically as needed., Disp: 35.44 g, Rfl: 5 .  loratadine (CLARITIN) 10 MG tablet, Take 10 mg by mouth daily., Disp: , Rfl:  .  meloxicam (MOBIC) 15 MG tablet, TAKE 1 TABLET ONCE DAILY. DO NOT USE ON THE SAME DAY AS NAPROXEN., Disp: 30 tablet, Rfl: 6 .  metFORMIN (GLUCOPHAGE-XR) 500 MG 24 hr tablet, TAKE (1) TABLET BY MOUTH EACH MORNING., Disp: 30 tablet, Rfl: 5 .  omeprazole (PRILOSEC) 40 MG capsule, TAKE ONE CAPSULE BY MOUTH ONCE DAILY., Disp: 30 capsule, Rfl: 5 .  propranolol (INDERAL) 10 MG tablet, TAKE 2 TABLETS TWICE A DAY AS NEEDED., Disp: 60 tablet, Rfl: 5 .  rosuvastatin (CRESTOR) 5 MG tablet, Take 1 tablet (5 mg total) by mouth daily., Disp: 30 tablet, Rfl: 4 .  triamcinolone (NASACORT) 55 MCG/ACT AERO nasal inhaler, USE 1 SPRAY IN EACH NOSTRIL DAILY., Disp: 1 Inhaler, Rfl: 5:  Allergies  Allergen Reactions  . Triptans Hives  . Pravastatin Nausea And Vomiting  . Topamax [Topiramate] Other (See Comments)    Tingling in hands  :  Family History  Problem Relation Age of Onset  . COPD Mother     Deceased, 51  . Prostate cancer Father     Deceased, 40  . Emphysema Sister   . Healthy Brother   . Liver cancer Other     and pancreatic cancer-paternal cousin  . Liver cancer Other     paternal cousin  :  Social History   Social History  . Marital status: Single    Spouse name: N/A  . Number of children: N/A  . Years of education: N/A   Occupational History  . Not on file.   Social History Main Topics  . Smoking status: Never Smoker  . Smokeless tobacco: Never Used  . Alcohol use No  . Drug use: No  . Sexual activity: Not Currently    Partners: Female    Birth control/ protection: Surgical, Abstinence     Comment: R-TLH/BSO   Other Topics Concern  . 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   Lives alone.  No children.  :  Pertinent items are noted in HPI.  Exam: Blood  pressure 138/80, pulse 84, temperature 98.2 F (36.8 C), temperature source Oral, resp. rate 18, height 5\' 3"  (1.6 m), weight 184 lb 4.8 oz (83.6 kg), last menstrual period 05/31/2013, SpO2 99 %.  ECOG 0.  General appearance: alert and cooperative appeared without distress. Head: Normocephalic, without obvious abnormality Throat: lips, mucosa, and tongue normal; teeth and gums normal Neck: no adenopathy Back: negative Resp: clear to auscultation bilaterally Chest wall: no tenderness Cardio: regular rate and rhythm, S1, S2 normal, no murmur, click, rub or gallop GI: soft, non-tender; bowel sounds normal; no masses,  no organomegaly or shifting dullness. Extremities: extremities normal, atraumatic, no cyanosis or edema  CBC    Component Value Date/Time   WBC 6.8 06/25/2016 1048   WBC 8.9 05/28/2016 1300   RBC 4.91 06/25/2016 1048   RBC 4.98  05/28/2016 1300   HGB 13.1 05/28/2016 1300   HGB 12.3 10/10/2012 1051   HCT 39.3 06/25/2016 1048   HCT 36.9 10/10/2012 1051   PLT 173 06/25/2016 1048   MCV 80 06/25/2016 1048   MCV 78.8 (L) 10/10/2012 1051   MCH 26.9 06/25/2016 1048   MCH 26.3 05/28/2016 1300   MCHC 33.6 06/25/2016 1048   MCHC 31.9 05/28/2016 1300   RDW 14.5 06/25/2016 1048   RDW 15.5 (H) 10/10/2012 1051   LYMPHSABS 2.6 06/25/2016 1048   LYMPHSABS 2.5 10/10/2012 1051   MONOABS 0.6 05/28/2016 1300   MONOABS 0.5 10/10/2012 1051   EOSABS 0.2 06/25/2016 1048   BASOSABS 0.0 06/25/2016 1048   BASOSABS 0.0 10/10/2012 1051     Chemistry      Component Value Date/Time   NA 140 05/19/2016 1034   NA 138 10/10/2012 1051   K 3.7 05/19/2016 1034   K 4.1 10/10/2012 1051   CL 99 05/19/2016 1034   CL 108 (H) 10/10/2012 1051   CO2 28 05/19/2016 1034   CO2 23 10/10/2012 1051   BUN 16 05/19/2016 1034   BUN 11.0 10/10/2012 1051   CREATININE 0.85 05/19/2016 1034   CREATININE 0.83 10/09/2014 1124   CREATININE 0.9 10/10/2012 1051      Component Value Date/Time   CALCIUM 9.4  05/19/2016 1034   CALCIUM 9.3 10/10/2012 1051   ALKPHOS 45 09/09/2016 1108   ALKPHOS 51 10/10/2012 1051   AST 36 09/09/2016 1108   AST 13 10/10/2012 1051   ALT 47 (H) 09/09/2016 1108   ALT 16 10/10/2012 1051   BILITOT 0.3 09/09/2016 1108   BILITOT 0.36 10/10/2012 1051       Assessment and Plan:   51 year old woman with the following issue:  1. 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 She was diagnosed in 2009 with an M spike of 0.5 g/dL and normal IgG level. She had normal skeletal survey without any end organ damage. She has been on observation and surveillance although have not followed routinely for the last few years.  The natural course of this disease was reviewed today. She appears to be asymptomatic from this illness and no signs or symptoms to suggest active myeloma. She does have a risk of myeloma progression which are close to 1% per year. Her most recent protein studies in November 2017 were personally reviewed and not different than her initial diagnosis in 2009. I see no evidence to suggest end organ damage. I recommended updating her skeletal survey for completeness.  If her skeletal survey continues to be normal, I recommended repeating her protein studies annually and she will be due to have those done in December 2018.  2. 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 Etiology is unclear that could be due to autoimmune disorder which could result in a monoclonal gammopathy. She seems to be managing well at this time and no clinical changes.  3. Follow-up: Will be in December 2018 sooner if she has abnormalities on her skeletal survey.

## 2016-12-16 ENCOUNTER — Telehealth: Payer: Self-pay | Admitting: Family Medicine

## 2016-12-16 NOTE — Telephone Encounter (Signed)
Patient last seen 08/17/2017. May we refill

## 2016-12-16 NOTE — Telephone Encounter (Signed)
Refill times 6 

## 2016-12-16 NOTE — Telephone Encounter (Signed)
Pt is needing refills on her FLUoxetine (PROZAC) 10 MG tablet     [facility]

## 2016-12-17 MED ORDER — FLUOXETINE HCL 10 MG PO TABS: 10.0000 mg | ORAL_TABLET | Freq: Every day | ORAL | 5 refills | Status: DC

## 2016-12-17 NOTE — Telephone Encounter (Signed)
Spoke with patient and informed her per Dr.Scott Luking- We are sending in refills on Prozac. Patient verbalized understanding.

## 2016-12-22 ENCOUNTER — Ambulatory Visit
Admission: RE | Admit: 2016-12-22 | Discharge: 2016-12-22 | Disposition: A | Discharge status: Home / Self Care | Payer: PRIVATE HEALTH INSURANCE | Source: Ambulatory Visit | Attending: Oncology | Admitting: Oncology

## 2016-12-22 DIAGNOSIS — D472 Monoclonal gammopathy: Secondary | ICD-10-CM

## 2016-12-24 ENCOUNTER — Telehealth: Payer: Self-pay | Admitting: Family Medicine

## 2016-12-24 DIAGNOSIS — E785 Hyperlipidemia, unspecified: Secondary | ICD-10-CM

## 2016-12-24 DIAGNOSIS — E119 Type 2 diabetes mellitus without complications: Secondary | ICD-10-CM

## 2016-12-24 DIAGNOSIS — R5383 Other fatigue: Secondary | ICD-10-CM

## 2016-12-24 NOTE — Telephone Encounter (Signed)
Pt is requesting a cbc and an a1c to be added to her labs. Pt is wanting to get these done on Monday.

## 2016-12-24 NOTE — Telephone Encounter (Signed)
Please do CBC A1c lipid and urine ACR-she also has other tests ordered by her specialists as well

## 2016-12-25 NOTE — Telephone Encounter (Signed)
Blood work ordered in EPIC. Patient notified. 

## 2016-12-28 ENCOUNTER — Other Ambulatory Visit: Payer: Self-pay | Admitting: Family Medicine

## 2016-12-29 LAB — CBC WITH DIFFERENTIAL/PLATELET
BASOS ABS: 0 10*3/uL (ref 0.0–0.2)
Basos: 0 %
EOS (ABSOLUTE): 0.2 10*3/uL (ref 0.0–0.4)
Eos: 2 %
Hematocrit: 42.8 % (ref 34.0–46.6)
Hemoglobin: 14 g/dL (ref 11.1–15.9)
IMMATURE GRANULOCYTES: 0 %
Immature Grans (Abs): 0 10*3/uL (ref 0.0–0.1)
LYMPHS ABS: 2.6 10*3/uL (ref 0.7–3.1)
Lymphs: 38 %
MCH: 26.7 pg (ref 26.6–33.0)
MCHC: 32.7 g/dL (ref 31.5–35.7)
MCV: 82 fL (ref 79–97)
Monocytes Absolute: 0.5 10*3/uL (ref 0.1–0.9)
Monocytes: 7 %
NEUTROS PCT: 53 %
Neutrophils Absolute: 3.5 10*3/uL (ref 1.4–7.0)
Platelets: 245 10*3/uL (ref 150–379)
RBC: 5.25 x10E6/uL (ref 3.77–5.28)
RDW: 14.9 % (ref 12.3–15.4)
WBC: 6.8 10*3/uL (ref 3.4–10.8)

## 2016-12-29 LAB — HEMOGLOBIN A1C
Est. average glucose Bld gHb Est-mCnc: 146 mg/dL
Hgb A1c MFr Bld: 6.7 % — ABNORMAL HIGH (ref 4.8–5.6)

## 2016-12-29 LAB — MICROALBUMIN / CREATININE URINE RATIO
Creatinine, Urine: 317.3 mg/dL
MICROALB/CREAT RATIO: 10 mg/g{creat} (ref 0.0–30.0)
Microalbumin, Urine: 31.6 ug/mL

## 2016-12-29 LAB — LIPID PANEL
CHOLESTEROL TOTAL: 205 mg/dL — AB (ref 100–199)
Chol/HDL Ratio: 4.8 ratio units — ABNORMAL HIGH (ref 0.0–4.4)
HDL: 43 mg/dL (ref >39)
LDL CALC: 113 mg/dL — AB (ref 0–99)
TRIGLYCERIDES: 246 mg/dL — AB (ref 0–149)
VLDL Cholesterol Cal: 49 mg/dL — ABNORMAL HIGH (ref 5–40)

## 2017-01-01 ENCOUNTER — Encounter: Payer: Self-pay | Admitting: Family Medicine

## 2017-01-01 ENCOUNTER — Ambulatory Visit (INDEPENDENT_AMBULATORY_CARE_PROVIDER_SITE_OTHER): Payer: PRIVATE HEALTH INSURANCE | Admitting: Family Medicine

## 2017-01-01 VITALS — BP 122/84 | Ht 63.0 in | Wt 180.8 lb

## 2017-01-01 DIAGNOSIS — J019 Acute sinusitis, unspecified: Secondary | ICD-10-CM | POA: Diagnosis not present

## 2017-01-01 DIAGNOSIS — E119 Type 2 diabetes mellitus without complications: Secondary | ICD-10-CM

## 2017-01-01 DIAGNOSIS — E7849 Other hyperlipidemia: Secondary | ICD-10-CM

## 2017-01-01 DIAGNOSIS — E1140 Type 2 diabetes mellitus with diabetic neuropathy, unspecified: Secondary | ICD-10-CM | POA: Diagnosis not present

## 2017-01-01 DIAGNOSIS — M5431 Sciatica, right side: Secondary | ICD-10-CM | POA: Diagnosis not present

## 2017-01-01 DIAGNOSIS — E784 Other hyperlipidemia: Secondary | ICD-10-CM

## 2017-01-01 DIAGNOSIS — F988 Other specified behavioral and emotional disorders with onset usually occurring in childhood and adolescence: Secondary | ICD-10-CM

## 2017-01-01 MED ORDER — AMPHETAMINE-DEXTROAMPHET ER 30 MG PO CP24: 30.0000 mg | ORAL_CAPSULE | ORAL | 0 refills | Status: DC

## 2017-01-01 MED ORDER — MELOXICAM 15 MG PO TABS: ORAL_TABLET | ORAL | 6 refills | Status: DC

## 2017-01-01 MED ORDER — PANTOPRAZOLE SODIUM 40 MG PO TBEC: 40.0000 mg | DELAYED_RELEASE_TABLET | Freq: Every day | ORAL | 5 refills | Status: DC

## 2017-01-01 MED ORDER — HYDROCODONE-ACETAMINOPHEN 5-325 MG PO TABS: 1.0000 | ORAL_TABLET | Freq: Three times a day (TID) | ORAL | 0 refills | Status: DC | PRN

## 2017-01-01 MED ORDER — PROPRANOLOL HCL 10 MG PO TABS: ORAL_TABLET | ORAL | 5 refills | Status: DC

## 2017-01-01 MED ORDER — ROSUVASTATIN CALCIUM 5 MG PO TABS: 5.0000 mg | ORAL_TABLET | Freq: Every day | ORAL | 4 refills | Status: DC

## 2017-01-01 MED ORDER — LIDOCAINE HCL 2 % EX GEL: 1.0000 "application " | CUTANEOUS | 3 refills | Status: DC | PRN

## 2017-01-01 MED ORDER — AMOXICILLIN-POT CLAVULANATE 875-125 MG PO TABS: 1.0000 | ORAL_TABLET | Freq: Two times a day (BID) | ORAL | 0 refills | Status: AC

## 2017-01-01 MED ORDER — DICLOFENAC SODIUM 1 % TD GEL: 4.0000 g | Freq: Four times a day (QID) | TRANSDERMAL | 3 refills | Status: DC

## 2017-01-01 NOTE — Progress Notes (Signed)
   Subjective:    Patient ID: Lisa Li, female    DOB: 1965-11-03, 51 y.o.   MRN: [id number]  HPI Patient arrives for a follow up on ADHD medication and refill pain medication. Patient states she is doing well on ADHD and pain medication .   Patient also follow up on pravastatin. Patient states she is able to take the Pravastatin with out problems  Patient would like to discuss results of recent lab work   Patient states she feels like she has an infection in her mouth from a recent dental procedure-on Amoxil but not helping   Patient needs refill of Voltaren gel and lidocaine jelly(not ointment) Patient states she uses Voltaren gel for her back pain. Statesmeloxicam alone does not do enough  She uses lidocaine jelly for her hands her fingers crack a lot more painful it helps with this Review of Systems  Constitutional: Negative for activity change, fatigue and fever.  Respiratory: Negative for cough and shortness of breath.   Cardiovascular: Negative for chest pain and leg swelling.  Neurological: Negative for headaches.       Objective:   Physical Exam  Constitutional: She appears well-nourished. No distress.  Cardiovascular: Normal rate, regular rhythm and normal heart sounds.   No murmur heard. Pulmonary/Chest: Effort normal and breath sounds normal. No respiratory distress.  Musculoskeletal: She exhibits no edema.  Lymphadenopathy:    She has no cervical adenopathy.  Neurological: She is alert. She exhibits normal muscle tone.  Psychiatric: Her behavior is normal.  Vitals reviewed.         Assessment & Plan:  Diabetes decent control watch diet try to lose weight continue medication  ADD adult This patient has adult ADD. Takes medication responsibly. Medication does help the patient focus in be more functional. Patient relates that they are not abusing the medication or misusing the medication. The patient understands that if they're having any negative side  effects such as elevated high blood pressure severe headaches palpitations they would need stop the medication follow-up immediately. They also understand that the prescriptions are to last for 3 months then the patient will need to follow-up before having further prescriptions.  Chronic painful neuropathy The patient was seen today as part of a comprehensive visit regarding pain control. Patient's compliance with the medication as well as discussion regarding effectiveness was completed. Prescriptions were written. Patient was advised to follow-up in 3 months. The patient was assessed for any signs of severe side effects. The patient was advised to take the medicine as directed and to report to [place] if any side effect issues.  Hyperlipidemia labs reviewed patient will gradually increase the dose and medicine 5 mg every single day she repeat lab work in 3 months if not doing dramatically better she will agree to go up on medicine  Sciatica stretching excises pain medicine as needed  Sinus infection switch to Augmentin I don't see any signs of dental complication from where she had to cut out. She does have some bruising around that area no abscess  25 minutes was spent with the patient. Greater than half the time was spent in discussion and answering questions and counseling regarding the issues that the patient came in for today.

## 2017-01-12 LAB — SPECIMEN STATUS REPORT

## 2017-01-16 LAB — HEPATIC FUNCTION PANEL
ALT: 48 IU/L — AB (ref 0–32)
AST: 30 IU/L (ref 0–40)
Albumin: 4.7 g/dL (ref 3.5–5.5)
Alkaline Phosphatase: 44 IU/L (ref 39–117)
Bilirubin, Direct: 0.09 mg/dL (ref 0.00–0.40)
TOTAL PROTEIN: 7.2 g/dL (ref 6.0–8.5)

## 2017-01-16 LAB — SPECIMEN STATUS REPORT

## 2017-03-15 ENCOUNTER — Other Ambulatory Visit: Payer: Self-pay | Admitting: Family Medicine

## 2017-04-06 ENCOUNTER — Encounter: Payer: Self-pay | Admitting: Family Medicine

## 2017-04-06 ENCOUNTER — Ambulatory Visit (INDEPENDENT_AMBULATORY_CARE_PROVIDER_SITE_OTHER): Payer: PRIVATE HEALTH INSURANCE | Admitting: Family Medicine

## 2017-04-06 VITALS — BP 132/80 | Ht 63.0 in | Wt 178.0 lb

## 2017-04-06 DIAGNOSIS — K219 Gastro-esophageal reflux disease without esophagitis: Secondary | ICD-10-CM | POA: Diagnosis not present

## 2017-04-06 DIAGNOSIS — M5432 Sciatica, left side: Secondary | ICD-10-CM | POA: Diagnosis not present

## 2017-04-06 DIAGNOSIS — K5792 Diverticulitis of intestine, part unspecified, without perforation or abscess without bleeding: Secondary | ICD-10-CM | POA: Diagnosis not present

## 2017-04-06 DIAGNOSIS — E1140 Type 2 diabetes mellitus with diabetic neuropathy, unspecified: Secondary | ICD-10-CM | POA: Diagnosis not present

## 2017-04-06 DIAGNOSIS — E119 Type 2 diabetes mellitus without complications: Secondary | ICD-10-CM

## 2017-04-06 DIAGNOSIS — M5431 Sciatica, right side: Secondary | ICD-10-CM | POA: Diagnosis not present

## 2017-04-06 MED ORDER — HYDROCODONE-ACETAMINOPHEN 5-325 MG PO TABS: ORAL_TABLET | ORAL | 0 refills | Status: DC

## 2017-04-06 MED ORDER — CIPROFLOXACIN HCL 500 MG PO TABS: 500.0000 mg | ORAL_TABLET | Freq: Two times a day (BID) | ORAL | 0 refills | Status: DC

## 2017-04-06 MED ORDER — AMPHETAMINE-DEXTROAMPHET ER 30 MG PO CP24: 30.0000 mg | ORAL_CAPSULE | ORAL | 0 refills | Status: DC

## 2017-04-06 MED ORDER — METRONIDAZOLE 500 MG PO TABS
500.0000 mg | ORAL_TABLET | Freq: Three times a day (TID) | ORAL | 0 refills | Status: DC
Start: 2017-04-06 — End: 2017-05-27

## 2017-04-06 MED ORDER — CHLORZOXAZONE 500 MG PO TABS: 500.0000 mg | ORAL_TABLET | Freq: Three times a day (TID) | ORAL | 4 refills | Status: DC | PRN

## 2017-04-06 MED ORDER — OMEPRAZOLE 40 MG PO CPDR: 40.0000 mg | DELAYED_RELEASE_CAPSULE | Freq: Every day | ORAL | 3 refills | Status: DC

## 2017-04-06 NOTE — Progress Notes (Signed)
Subjective:    Patient ID: Lisa Li, female    DOB: 23-Aug-1966, 51 y.o.   MRN: [id number] This patient was seen today for chronic pain  The medication list was reviewed and updated.   -Compliance with medication: yes  - Number patient states they take daily: 4 a day  -when was the last dose patient took? today  The patient was advised the importance of maintaining medication and not using illegal substances with these.  Refills needed: yes  The patient was educated that we can provide 3 monthly scripts for their medication, it is their responsibility to follow the instructions.  Side effects or complications from medications: none  Patient is aware that pain medications are meant to minimize the severity of the pain to allow their pain levels to improve to allow for better function. They are aware of that pain medications cannot totally remove their pain.  Due for UDT ( at least once per year) : last one 05/21/16     Diabetes  She presents for her follow-up diabetic visit. She has type 2 diabetes mellitus.  pt refused A1c in office. She does not like finger sticks.  Pt wants to go back on omeprazole 40mg . protonix not helping.   Pt wants to discuss muscle relaxer for neck.  Multiple other issues Patient has lower abdominal pain discomfort left side abdomen lower abdomen she has history of diverticulitis symptoms present or past few days denies high fever chills sweats she does relate unrelenting pain although she is able to eat and drink  She also relates severe back pain with sciatica both legs she takes pain medicine 3-4 times per day to help keep it under control denies abusing the medicine states it does do a decent job of keeping things under control  She also takes ADD medicine help her focus. Without taking it she has difficult time functioning throughout the day does improve her focus  Patient also relates neck spasms and spasms in the legs intermittently she  is due for blood work She does want to try muscle relaxer we talked long about how muscle relaxers can cause drowsiness she will tried at home first so if she ever needed it outside of the home it would be known not to take it if it causes drowsiness  She also has diabetes did not do her blood work she will go ahead and do her blood work she is try to watch her diet she states she is interested in possibly going away from metformin because she feels it is causing some intermittent numbness sensations throughout her body  She also states she's had a reflux issues that protonic's is not covering he states when she was on omeprazole 40 mg it was doing a good job. She would like to go back to this medicine  Review of Systems Denies headaches she does relate neck spasms denies chest tightness pressure pain shortness breath denies vomiting or diarrhea denies high fever chills does relate abdominal pain and discomfort left side and left lower she does relate burning in her feet    Objective:   Physical Exam HEENT is benign neck no masses lungs are clear no crackles heart is regular pulses normal abdomen soft with moderate epigastric tenderness also has moderate left mid and left lower quadrant tenderness no guarding rebound extremities no edema skin warm dry       Assessment & Plan:  Diabetes to get lab work await the results previous lab is reviewed  continue metformin for now possibly changing to something different because of intermittent numbness patient will let us know she wants to try some different  Chronic pain importance of taking the pain medicine take the edge off the pain was discussed no more than 3 or 4 per day caution drowsiness pain registry was checked  ADD-drug registry was checked continue medication 1 per day 3 prescriptions were given it does help her function  Diverticulitis [place] antibiotics with Cipro and Flagyl were initiated the patient not doing better over the next 7-10  days notify us we'll set up CT scan patient had colonoscopy earlier this year which looked good  Muscle spasms may use Parafon forte first try home to make sure it does not cause drowsiness use sparingly  Reflux omeprazole as directed if ongoing troubles to notify us  Follow-up 3 months

## 2017-04-16 ENCOUNTER — Encounter: Payer: Self-pay | Admitting: Family Medicine

## 2017-04-16 DIAGNOSIS — R5383 Other fatigue: Secondary | ICD-10-CM

## 2017-04-16 DIAGNOSIS — M25561 Pain in right knee: Secondary | ICD-10-CM

## 2017-04-16 NOTE — Telephone Encounter (Signed)
Please order vitamin D level tell the patient when she goes she needs to be specific to make sure all of her lab tests are drawn. Also she may have a order for x-ray of the affected knee at [facility] imaging-it'll be important for the patient to make sure that we get a copy of the results.

## 2017-04-16 NOTE — Addendum Note (Signed)
Addended by: Dairl Ponder on: 04/16/2017 04:45 PM   Modules accepted: Orders

## 2017-04-26 ENCOUNTER — Other Ambulatory Visit: Payer: Self-pay | Admitting: Family Medicine

## 2017-05-01 LAB — LIPID PANEL
CHOL/HDL RATIO: 4.3 ratio (ref 0.0–4.4)
CHOLESTEROL TOTAL: 172 mg/dL (ref 100–199)
HDL: 40 mg/dL (ref >39)
LDL CALC: 94 mg/dL (ref 0–99)
Triglycerides: 191 mg/dL — ABNORMAL HIGH (ref 0–149)
VLDL CHOLESTEROL CAL: 38 mg/dL (ref 5–40)

## 2017-05-01 LAB — BASIC METABOLIC PANEL
BUN/Creatinine Ratio: 16 (ref 9–23)
BUN: 14 mg/dL (ref 6–24)
CALCIUM: 9.6 mg/dL (ref 8.7–10.2)
CHLORIDE: 101 mmol/L (ref 96–106)
CO2: 22 mmol/L (ref 20–29)
Creatinine, Ser: 0.85 mg/dL (ref 0.57–1.00)
GFR calc non Af Amer: 79 mL/min/{1.73_m2} (ref >59)
GFR, EST AFRICAN AMERICAN: 91 mL/min/{1.73_m2} (ref >59)
GLUCOSE: 122 mg/dL — AB (ref 65–99)
Potassium: 4.5 mmol/L (ref 3.5–5.2)
Sodium: 141 mmol/L (ref 134–144)

## 2017-05-01 LAB — HEMOGLOBIN A1C
ESTIMATED AVERAGE GLUCOSE: 140 mg/dL
HEMOGLOBIN A1C: 6.5 % — AB (ref 4.8–5.6)

## 2017-05-01 LAB — HEPATIC FUNCTION PANEL
ALT: 51 IU/L — AB (ref 0–32)
AST: 44 IU/L — AB (ref 0–40)
Albumin: 4.7 g/dL (ref 3.5–5.5)
Alkaline Phosphatase: 44 IU/L (ref 39–117)
BILIRUBIN TOTAL: 0.3 mg/dL (ref 0.0–1.2)
Bilirubin, Direct: 0.08 mg/dL (ref 0.00–0.40)
Total Protein: 7 g/dL (ref 6.0–8.5)

## 2017-05-01 LAB — VITAMIN D 25 HYDROXY (VIT D DEFICIENCY, FRACTURES): VIT D 25 HYDROXY: 49.3 ng/mL (ref 30.0–100.0)

## 2017-05-05 ENCOUNTER — Encounter: Payer: Self-pay | Admitting: Family Medicine

## 2017-05-05 ENCOUNTER — Other Ambulatory Visit: Payer: Self-pay | Admitting: Family Medicine

## 2017-05-05 MED ORDER — CEPHALEXIN 500 MG PO CAPS: 500.0000 mg | ORAL_CAPSULE | Freq: Four times a day (QID) | ORAL | 0 refills | Status: DC

## 2017-05-26 ENCOUNTER — Telehealth: Payer: Self-pay | Admitting: Obstetrics & Gynecology

## 2017-05-26 NOTE — Telephone Encounter (Signed)
Left message to call Kaitlyn at [phone]. 

## 2017-05-26 NOTE — Telephone Encounter (Signed)
Patient has a vaginal rash that will not go away.

## 2017-05-27 ENCOUNTER — Ambulatory Visit (INDEPENDENT_AMBULATORY_CARE_PROVIDER_SITE_OTHER): Payer: PRIVATE HEALTH INSURANCE | Admitting: Obstetrics & Gynecology

## 2017-05-27 ENCOUNTER — Encounter: Payer: Self-pay | Admitting: Obstetrics & Gynecology

## 2017-05-27 VITALS — BP 132/76 | HR 72 | Resp 16 | Wt 174.0 lb

## 2017-05-27 DIAGNOSIS — L292 Pruritus vulvae: Secondary | ICD-10-CM | POA: Diagnosis not present

## 2017-05-27 DIAGNOSIS — N9089 Other specified noninflammatory disorders of vulva and perineum: Secondary | ICD-10-CM

## 2017-05-27 DIAGNOSIS — R3915 Urgency of urination: Secondary | ICD-10-CM | POA: Diagnosis not present

## 2017-05-27 LAB — POCT URINALYSIS DIPSTICK
BILIRUBIN UA: NEGATIVE
Blood, UA: NEGATIVE
GLUCOSE UA: 50
Ketones, UA: NEGATIVE
LEUKOCYTES UA: NEGATIVE
NITRITE UA: NEGATIVE
PH UA: 5 (ref 5.0–8.0)
Protein, UA: NEGATIVE
Urobilinogen, UA: 0.2 E.U./dL

## 2017-05-27 NOTE — Telephone Encounter (Signed)
Spoke with patient. Patient states that she has had a vaginal rash for 2 months. Patient has applied OTC medication to the area without relief. Rash has now become very itching and irritated. Requesting and appointment with Dr.Miller. Appointment scheduled for today 05/27/17 at 11:15 am. Patient is agreeable to date and time.  Routing to provider for final review. Patient agreeable to disposition. Will close encounter.

## 2017-05-27 NOTE — Progress Notes (Signed)
GYNECOLOGY  VISIT   HPI: 51 y.o. G0P0 Single Caucasian female here for complaint of vulvar  rash that has been present about two to three months.  Started about the same time as she developed and itchy place on her scalp.  The scalp patch was so itchy at times there was weepiness from the tissue.  Reports she had itchy lower lids of her eyes.  Did go to the ophthalmologist and was treated for a stye.  Can't remember his name.  The eyes are better.  Would like a suggestion for an opthalmologic.    Reports her hair dresser says the itchiness on her scalp doesn't look scaly but is a little red.  She is not sure if all of these are connected but would like to try and figure it out.  Has not seen a dermatologist.    The vular rash is itchy and looks red.  Seems to have some flares at times.  Has used topical steroid cream without any improvement.  Has used a little topical antifungal as well.  No improvement in symptoms with this either.  Denies vaginal bleeding or vaginal discharge.  Denies fever.  hGYNECOLOGIC HISTORY: Patient's last menstrual period was 05/31/2013. Contraception: hysterectomy Menopausal hormone therapy: none  Patient Active Problem List   Diagnosis Date Noted  . Attention deficit disorder (ADD) 05/21/2016  . Fatty liver 05/21/2016  . Painful diabetic neuropathy ([place]) 04/21/2016  . History of colonic polyps 08/23/2015  . Elevated transaminase level 07/26/2015  . Sciatica 07/26/2015  . Other hyperlipidemia 06/19/2014  . Diabetes type 2, controlled ([place]) 02/05/2014  . GERD (gastroesophageal reflux disease) 02/05/2014  . Hypertriglyceridemia 02/05/2014  . Nephrolithiasis 02/05/2014  . Disturbance of skin sensation 10/03/2013  . Migraine 02/15/2013  . 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 Bleeding internal hemorrhoids 03/28/2012    Past Medical History:  Diagnosis Date  . Frequent UTI   . Kidney stone   . Meniere's disease 2017   Dr. Benjamine Mola   . MGUS (monoclonal  gammopathy of unknown significance)   . Migraine headache   . Myalgia    Chronic  . Pre-diabetes     Past Surgical History:  Procedure Laterality Date  . APPENDECTOMY    . CHOLECYSTECTOMY    . COLONOSCOPY W/ ENDOSCOPIC [place]  09/27/06   02/2011   . CYSTOSCOPY N/A 07/03/2013   Procedure: CYSTOSCOPY;  Surgeon: Lyman Speller, MD;  Location: [facility];  Service: Gynecology;  Laterality: N/A;  . ESOPHAGOGASTRODUODENOSCOPY    . ROBOTIC ASSISTED TOTAL HYSTERECTOMY WITH BILATERAL SALPINGO OOPHERECTOMY Bilateral 07/03/2013   Procedure: ROBOTIC ASSISTED TOTAL HYSTERECTOMY WITH BILATERAL SALPINGO OOPHORECTOMY;  Surgeon: Lyman Speller, MD;  Location: [facility];  Service: Gynecology;  Laterality: Bilateral;  . TONSILLECTOMY  1973    MEDS:   Current Outpatient Prescriptions on File Prior to Visit  Medication Sig Dispense Refill  . amphetamine-dextroamphetamine (ADDERALL XR) 30 MG 24 hr capsule Take 1 capsule (30 mg total) by mouth every morning. 30 capsule 0  . chlorzoxazone (PARAFON FORTE DSC) 500 MG tablet Take 1 tablet (500 mg total) by mouth 3 (three) times daily as needed for muscle spasms. 45 tablet 4  . diazepam (VALIUM) 5 MG tablet TAKE 1 TABLET AT BEDTIME AS NEEDED. 30 tablet 5  . diclofenac sodium (VOLTAREN) 1 % GEL Apply 4 g topically 4 (four) times daily. 1 Tube 3  . docusate sodium (COLACE) 100 MG capsule Take 100 mg by mouth as needed.     Marland Kitchen  estradiol (MINIVELLE) 0.1 MG/24HR patch Place 1 patch (0.1 mg total) onto the skin 2 (two) times a week. 24 patch 4  . FLUoxetine (PROZAC) 10 MG tablet Take 1 tablet (10 mg total) by mouth daily. 30 tablet 5  . HYDROcodone-acetaminophen (NORCO/VICODIN) 5-325 MG tablet Take one every 6 hours prn pain 120 tablet 0  . lidocaine (XYLOCAINE JELLY) 2 % jelly Apply 1 application topically as needed. 30 mL 3  . lidocaine (XYLOCAINE) 5 % ointment Apply 1 application topically as needed. 35.44 g 5  . loratadine (CLARITIN) 10 MG tablet Take 10 mg by mouth  daily.    . meloxicam (MOBIC) 15 MG tablet TAKE 1 TABLET ONCE DAILY. DO NOT USE ON THE SAME DAY AS NAPROXEN. 30 tablet 6  . metFORMIN (GLUCOPHAGE-XR) 500 MG 24 hr tablet TAKE (1) TABLET BY MOUTH EACH MORNING. 30 tablet 5  . omeprazole (PRILOSEC) 40 MG capsule Take 1 capsule (40 mg total) by mouth daily. 30 capsule 3  . propranolol (INDERAL) 10 MG tablet TAKE 1TABLETS TWICE A DAY AS NEEDED. 60 tablet 5  . rosuvastatin (CRESTOR) 5 MG tablet Take 1 tablet (5 mg total) by mouth daily. 30 tablet 4  . triamcinolone (NASACORT) 55 MCG/ACT AERO nasal inhaler USE 1 SPRAY IN EACH NOSTRIL DAILY. 1 Inhaler 5   No current facility-administered medications on file prior to visit.      ALLERGIES: Triptans; Pravastatin; and Topamax [topiramate]  Family History  Problem Relation Age of Onset  . COPD Mother        Deceased, 40  . Prostate cancer Father        Deceased, 19  . Emphysema Sister   . Healthy Brother   . Liver cancer Other        and pancreatic cancer-paternal cousin  . Liver cancer Other        paternal cousin    SH:  In long term relationship, non smoker  Review of Systems  Gastrointestinal: Negative.   Genitourinary: Negative.   Skin: Positive for itching and rash.    PHYSICAL EXAMINATION:    BP 132/76 (BP Location: Right Arm, Patient Position: Sitting, Cuff Size: Normal)   Pulse 72   Resp 16   Wt 174 lb (78.9 kg)   LMP 05/31/2013   BMI 30.82 kg/m    (-14# Physical Exam  Constitutional: She is oriented to person, place, and time. She appears well-developed and well-nourished.  Genitourinary:     Neurological: She is alert and oriented to person, place, and time.  Skin: Skin is warm and dry. Rash (multiple erythematous patches with scale in scalp) noted.   Due to findings, biopsy recommended.  Area cleansed with Betadine x 3.  0.5% Lidocaine.  67mm punch biopsy obtained.  Silver nitrate used for hemostasis.  Pt tolerated procedure well.  No dressing  applied.  Chaperone was present for exam.  Assessment: Vulvar itching Scalp itching Eye lid itching  Plan: Biopsy pending.  Results will be called to pt.  Recommended ophthalmologist evaluation as well.

## 2017-06-02 ENCOUNTER — Telehealth: Payer: Self-pay | Admitting: Obstetrics & Gynecology

## 2017-06-02 NOTE — Telephone Encounter (Signed)
Biopsy report reviewed and shows epidermal hyperplasia with hyperkeratosis and focal spongiosis.  The pathologist states that the changes present suggest chronic eczema and chronic lichen simplex chronicus (the latter are skin changes from a chronic itch and scratch cycle.)  No yeast infection or cancerous cells were seen.   Dr. Sabra Heck will review this upon her return and make final recommendations.

## 2017-06-02 NOTE — Telephone Encounter (Signed)
Patient calling for biopsy results

## 2017-06-02 NOTE — Telephone Encounter (Signed)
Vulvar biopsy results from 05/27/2017 to Dr.Silva as Dr.Miller is out of the office today.

## 2017-06-02 NOTE — Telephone Encounter (Signed)
Left message on mobile number to call Sharee Pimple at [phone]. Advised office is closed, phones return on at 8am, will try home number on file.  Also attempted home number on file x2, message states "were are sorry, all circuits are busy now, please try call again later".

## 2017-06-03 MED ORDER — HYDROXYZINE HCL 25 MG PO TABS: 25.0000 mg | ORAL_TABLET | Freq: Every evening | ORAL | 0 refills | Status: DC

## 2017-06-03 MED ORDER — BETAMETHASONE DIPROPIONATE 0.05 % EX OINT: TOPICAL_OINTMENT | Freq: Every evening | CUTANEOUS | 0 refills | Status: DC

## 2017-06-03 NOTE — Telephone Encounter (Signed)
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  This means there is eczema (itchy skin condition) that is causing itching (of course).  The itching results in the lichen simplex chronicus.  This did not look like psoriasis with the vulvar biopsy.  Should be treated with Betamethasone 0.05% ointment nightly and thin layer of vaseline applied in the morning to the area where the vulvar itching is occurring.  OK to send in rx for 30-60gm tube (whatever is available).  Also, I think she should take Atarax at night 25mg  nightly.  Will make her sleepy but will help with itching.  #30/0RF.  Lastly, as the patches in her scalp are not classic appearance or location for eczema, I really think she should see a dermatologist.  Can give recommendations for her to typical recommendations that we make.  Thanks.

## 2017-06-03 NOTE — Telephone Encounter (Signed)
Spoke with patient, advised as seen below per Dr. Quincy Simmonds. Patient request MyChart message with Dr. Ammie Ferrier recommendations or leaving a detailed message on voicemail if no answer. Patient verbalizes understanding and is agreeable.   Routing to Dr. Sabra Heck for final review and recommendations.

## 2017-06-03 NOTE — Telephone Encounter (Signed)
Call to patient, no answer. See MyChart message to patient as previously requested if no answer by phone.

## 2017-06-04 NOTE — Telephone Encounter (Signed)
MyChart message to include results and recommendations per Dr. Sabra Heck, last read by Bennie Dallas at 8:59 PM on 06/03/2017.  Will close encounter.

## 2017-06-07 ENCOUNTER — Other Ambulatory Visit: Payer: Self-pay | Admitting: Obstetrics & Gynecology

## 2017-06-07 DIAGNOSIS — Z1231 Encounter for screening mammogram for malignant neoplasm of breast: Secondary | ICD-10-CM

## 2017-06-16 ENCOUNTER — Ambulatory Visit
Admission: RE | Admit: 2017-06-16 | Discharge: 2017-06-16 | Disposition: A | Discharge status: Home / Self Care | Payer: PRIVATE HEALTH INSURANCE | Source: Ambulatory Visit | Attending: Obstetrics & Gynecology | Admitting: Obstetrics & Gynecology

## 2017-06-16 DIAGNOSIS — Z1231 Encounter for screening mammogram for malignant neoplasm of breast: Secondary | ICD-10-CM

## 2017-06-23 ENCOUNTER — Other Ambulatory Visit: Payer: Self-pay | Admitting: Family Medicine

## 2017-07-06 ENCOUNTER — Ambulatory Visit: Payer: PRIVATE HEALTH INSURANCE | Admitting: Family Medicine

## 2017-07-09 ENCOUNTER — Encounter: Payer: Self-pay | Admitting: Family Medicine

## 2017-07-09 ENCOUNTER — Ambulatory Visit (INDEPENDENT_AMBULATORY_CARE_PROVIDER_SITE_OTHER): Payer: PRIVATE HEALTH INSURANCE | Admitting: Family Medicine

## 2017-07-09 VITALS — BP 132/88 | Ht 63.0 in | Wt 177.4 lb

## 2017-07-09 DIAGNOSIS — E1140 Type 2 diabetes mellitus with diabetic neuropathy, unspecified: Secondary | ICD-10-CM | POA: Diagnosis not present

## 2017-07-09 DIAGNOSIS — E119 Type 2 diabetes mellitus without complications: Secondary | ICD-10-CM

## 2017-07-09 DIAGNOSIS — Z79891 Long term (current) use of opiate analgesic: Secondary | ICD-10-CM | POA: Diagnosis not present

## 2017-07-09 DIAGNOSIS — R740 Nonspecific elevation of levels of transaminase and lactic acid dehydrogenase [LDH]: Secondary | ICD-10-CM | POA: Diagnosis not present

## 2017-07-09 DIAGNOSIS — R7401 Elevation of levels of liver transaminase levels: Secondary | ICD-10-CM

## 2017-07-09 DIAGNOSIS — F988 Other specified behavioral and emotional disorders with onset usually occurring in childhood and adolescence: Secondary | ICD-10-CM | POA: Diagnosis not present

## 2017-07-09 DIAGNOSIS — K760 Fatty (change of) liver, not elsewhere classified: Secondary | ICD-10-CM

## 2017-07-09 MED ORDER — FLUOXETINE HCL 10 MG PO TABS: ORAL_TABLET | ORAL | 5 refills | Status: DC

## 2017-07-09 MED ORDER — AMPHETAMINE-DEXTROAMPHET ER 30 MG PO CP24: 30.0000 mg | ORAL_CAPSULE | ORAL | 0 refills | Status: DC

## 2017-07-09 MED ORDER — ROSUVASTATIN CALCIUM 5 MG PO TABS: 5.0000 mg | ORAL_TABLET | Freq: Every day | ORAL | 5 refills | Status: DC

## 2017-07-09 MED ORDER — METFORMIN HCL ER 500 MG PO TB24: ORAL_TABLET | ORAL | 5 refills | Status: DC

## 2017-07-09 MED ORDER — HYDROCODONE-ACETAMINOPHEN 5-325 MG PO TABS: ORAL_TABLET | ORAL | 0 refills | Status: DC

## 2017-07-09 MED ORDER — TRIAMCINOLONE ACETONIDE 0.1 % EX CREA: TOPICAL_CREAM | CUTANEOUS | 4 refills | Status: DC

## 2017-07-09 MED ORDER — OMEPRAZOLE 40 MG PO CPDR: 40.0000 mg | DELAYED_RELEASE_CAPSULE | Freq: Every day | ORAL | 3 refills | Status: DC

## 2017-07-09 MED ORDER — PROPRANOLOL HCL 10 MG PO TABS: ORAL_TABLET | ORAL | 5 refills | Status: DC

## 2017-07-09 MED ORDER — BETAMETHASONE DIPROPIONATE 0.05 % EX OINT: TOPICAL_OINTMENT | Freq: Every evening | CUTANEOUS | 4 refills | Status: DC

## 2017-07-09 MED ORDER — DICLOFENAC SODIUM 1 % TD GEL: 4.0000 g | Freq: Four times a day (QID) | TRANSDERMAL | 10 refills | Status: DC

## 2017-07-09 NOTE — Progress Notes (Addendum)
Subjective:    Patient ID: Lisa Li, female    DOB: [birth year], 51 y.o.   MRN: [id number]  HPI This patient was seen today for chronic pain  The medication list was reviewed and updated.   -Compliance with medication: Takes daily   - Number patient states they take daily: Patient states takes 3-4 tablets daily. -when was the last dose patient took?  Last dose was yesterday.   The patient was advised the importance of maintaining medication and not using illegal substances with these.  Refills needed: Yes   The patient was educated that we can provide 3 monthly scripts for their medication, it is their responsibility to follow the instructions.  Side effects or complications from medications: None   Patient is aware that pain medications are meant to minimize the severity of the pain to allow their pain levels to improve to allow for better function. They are aware of that pain medications cannot totally remove their pain.  Due for UDT ( at least once per year) : Today.   Patient also needs refills.  This patient does relate she has ADD Takes medication Does a good job controlling her symptoms Needs refills Denies any abuse of the medication Drug registry was checked regarding controlled meds  Patient states diabetes been under good control will be due for lab work in the near future she would like to do her labs in October She is tolerating the metformin some loose stools currently does not one a change to [place]  Patient does take the meloxicam for back pain seems to help some but she has low back pain and sciatic joints she wonders if she needs injections I encouraged her to connect with her orthopedist-she does relate some sciatica down the right leg I did recommend stretching exercises conservative management at this persists over the next several months next step would be MRI  She is taking her cholesterol medicine watching her diet She also takes her propranolol  it does a good job keeping blood pressure under control Omeprazole does a good job keeping acid reflux under control  Review of Systems  Constitutional: Negative for activity change and appetite change.  HENT: Negative for congestion.   Respiratory: Negative for cough.   Cardiovascular: Negative for chest pain.  Gastrointestinal: Negative for abdominal pain and vomiting.  Skin: Negative for color change.  Neurological: Negative for weakness.  Psychiatric/Behavioral: Negative for confusion.       Objective:   Physical Exam  Constitutional: She appears well-nourished. No distress.  HENT:  Head: Normocephalic.  Right Ear: External ear normal.  Left Ear: External ear normal.  Eyes: Right eye exhibits no discharge. Left eye exhibits no discharge.  Neck: No tracheal deviation present.  Cardiovascular: Normal rate, regular rhythm and normal heart sounds.   No murmur heard. Pulmonary/Chest: Effort normal and breath sounds normal. No respiratory distress. She has no wheezes. She has no rales.  Musculoskeletal: She exhibits no edema.  Lymphadenopathy:    She has no cervical adenopathy.  Neurological: She is alert.  Psychiatric: Her behavior is normal.  Vitals reviewed.  Diabetic foot exam completed 25 minutes was spent with the patient. Greater than half the time was spent in discussion and answering questions and counseling regarding the issues that the patient came in for today.     Assessment & Plan:  The patient was seen today as part of a comprehensive visit regarding pain control. Patient's compliance with the medication as well as discussion  regarding effectiveness was completed. Prescriptions were written. Patient was advised to follow-up in 3 months. The patient was assessed for any signs of severe side effects. The patient was advised to take the medicine as directed and to report to [place] if any side effect issues. ADD prescriptions were given follow-up 3 months Diabetic  neuropathy under decent control hydrocodone 4 times a day when necessary Lumbar back pain sciatic joint discomfort referral patient already has relationship with orthopedics Diabetes under good control check lab work Fatty liver encourage patient lose weight recheck liver profile Hyperlipidemia continue medications.

## 2017-07-09 NOTE — Patient Instructions (Signed)
Diabetes Mellitus and Food It is important for you to manage your blood sugar (glucose) level. Your blood glucose level can be greatly affected by what you eat. Eating healthier foods in the appropriate amounts throughout the day at about the same time each day will help you control your blood glucose level. It can also help slow or prevent worsening of your diabetes mellitus. Healthy eating may even help you improve the level of your blood pressure and reach or maintain a healthy weight. General recommendations for healthful eating and cooking habits include:  Eating meals and snacks regularly. Avoid going long periods of time without eating to lose weight.  Eating a diet that consists mainly of plant-based foods, such as fruits, vegetables, nuts, legumes, and whole grains.  Using low-heat cooking methods, such as baking, instead of high-heat cooking methods, such as deep frying.  Work with your dietitian to make sure you understand how to use the Nutrition Facts information on food labels. How can food affect me? Carbohydrates Carbohydrates affect your blood glucose level more than any other type of food. Your dietitian will help you determine how many carbohydrates to eat at each meal and teach you how to count carbohydrates. Counting carbohydrates is important to keep your blood glucose at a healthy level, especially if you are using insulin or taking certain medicines for diabetes mellitus. Alcohol Alcohol can cause sudden decreases in blood glucose (hypoglycemia), especially if you use insulin or take certain medicines for diabetes mellitus. Hypoglycemia can be a life-threatening condition. Symptoms of hypoglycemia (sleepiness, dizziness, and disorientation) are similar to symptoms of having too much alcohol. If your health care provider has given you approval to drink alcohol, do so in moderation and use the following guidelines:  Women should not have more than one drink per day, and men  should not have more than two drinks per day. One drink is equal to: ? 12 oz of beer. ? 5 oz of wine. ? 1 oz of hard liquor.  Do not drink on an empty stomach.  Keep yourself hydrated. Have water, diet soda, or unsweetened iced tea.  Regular soda, juice, and other mixers might contain a lot of carbohydrates and should be counted.  What foods are not recommended? As you make food choices, it is important to remember that all foods are not the same. Some foods have fewer nutrients per serving than other foods, even though they might have the same number of calories or carbohydrates. It is difficult to get your body what it needs when you eat foods with fewer nutrients. Examples of foods that you should avoid that are high in calories and carbohydrates but low in nutrients include:  Trans fats (most processed foods list trans fats on the Nutrition Facts label).  Regular soda.  Juice.  Candy.  Sweets, such as cake, pie, doughnuts, and cookies.  Fried foods.  What foods can I eat? Eat nutrient-rich foods, which will nourish your body and keep you healthy. The food you should eat also will depend on several factors, including:  The calories you need.  The medicines you take.  Your weight.  Your blood glucose level.  Your blood pressure level.  Your cholesterol level.  You should eat a variety of foods, including:  Protein. ? Lean cuts of meat. ? Proteins low in saturated fats, such as fish, egg whites, and beans. Avoid processed meats.  Fruits and vegetables. ? Fruits and vegetables that may help control blood glucose levels, such as apples,   mangoes, and yams.  Dairy products. ? Choose fat-free or low-fat dairy products, such as milk, yogurt, and cheese.  Grains, bread, pasta, and rice. ? Choose whole grain products, such as multigrain bread, whole oats, and brown rice. These foods may help control blood pressure.  Fats. ? Foods containing healthful fats, such as  nuts, avocado, olive oil, canola oil, and fish.  Does everyone with diabetes mellitus have the same meal plan? Because every person with diabetes mellitus is different, there is not one meal plan that works for everyone. It is very important that you meet with a dietitian who will help you create a meal plan that is just right for you. This information is not intended to replace advice given to you by your health care provider. Make sure you discuss any questions you have with your health care provider. Document Released: 06/25/2005 Document Revised: 03/05/2016 Document Reviewed: 08/25/2013 Elsevier Interactive Patient Education  2017 [employer] DASH stands for "Dietary Approaches to Stop Hypertension." The DASH eating plan is a healthy eating plan that has been shown to reduce high blood pressure (hypertension). It may also reduce your risk for type 2 diabetes, heart disease, and stroke. The DASH eating plan may also help with weight loss. What are tips for following this plan? General guidelines  Avoid eating more than 2,300 mg (milligrams) of salt (sodium) a day. If you have hypertension, you may need to reduce your sodium intake to 1,500 mg a day.  Limit alcohol intake to no more than 1 drink a day for nonpregnant women and 2 drinks a day for men. One drink equals 12 oz of beer, 5 oz of wine, or 1 oz of hard liquor.  Work with your health care provider to maintain a healthy body weight or to lose weight. Ask what an ideal weight is for you.  Get at least 30 minutes of exercise that causes your heart to beat faster (aerobic exercise) most days of the week. Activities may include walking, swimming, or biking.  Work with your health care provider or diet and nutrition specialist (dietitian) to adjust your eating plan to your individual calorie needs. Reading food labels  Check food labels for the amount of sodium per serving. Choose foods with less than 5 percent of the  Daily Value of sodium. Generally, foods with less than 300 mg of sodium per serving fit into this eating plan.  To find whole grains, look for the word "whole" as the first word in the ingredient list. Shopping  Buy products labeled as "low-sodium" or "no salt added."  Buy fresh foods. Avoid canned foods and premade or frozen meals. Cooking  Avoid adding salt when cooking. Use salt-free seasonings or herbs instead of table salt or sea salt. Check with your health care provider or pharmacist before using salt substitutes.  Do not fry foods. Cook foods using healthy methods such as baking, boiling, grilling, and broiling instead.  Cook with heart-healthy oils, such as olive, canola, soybean, or sunflower oil. Meal planning   Eat a balanced diet that includes: ? 5 or more servings of fruits and vegetables each day. At each meal, try to fill half of your plate with fruits and vegetables. ? Up to 6-8 servings of whole grains each day. ? Less than 6 oz of lean meat, poultry, or fish each day. A 3-oz serving of meat is about the same size as a deck of cards. One egg equals 1 oz. ? 2 servings  of low-fat dairy each day. ? A serving of nuts, seeds, or beans 5 times each week. ? Heart-healthy fats. Healthy fats called Omega-3 fatty acids are found in foods such as flaxseeds and coldwater fish, like sardines, salmon, and mackerel.  Limit how much you eat of the following: ? Canned or prepackaged foods. ? Food that is high in trans fat, such as fried foods. ? Food that is high in saturated fat, such as fatty meat. ? Sweets, desserts, sugary drinks, and other foods with added sugar. ? Full-fat dairy products.  Do not salt foods before eating.  Try to eat at least 2 vegetarian meals each week.  Eat more home-cooked food and less restaurant, buffet, and fast food.  When eating at a restaurant, ask that your food be prepared with less salt or no salt, if possible. What foods are  recommended? The items listed may not be a complete list. Talk with your dietitian about what dietary choices are best for you. Grains Whole-grain or whole-wheat bread. Whole-grain or whole-wheat pasta. Brown rice. [place]. Bulgur. Whole-grain and low-sodium cereals. Pita bread. Low-fat, low-sodium crackers. Whole-wheat flour tortillas. Vegetables Fresh or frozen vegetables (raw, steamed, roasted, or grilled). Low-sodium or reduced-sodium tomato and vegetable juice. Low-sodium or reduced-sodium tomato sauce and tomato paste. Low-sodium or reduced-sodium canned vegetables. Fruits All fresh, dried, or frozen fruit. Canned fruit in natural juice (without added sugar). Meat and other protein foods Skinless chicken or [place]. Ground chicken or [place]. Pork with fat trimmed off. Fish and seafood. Egg whites. Dried beans, peas, or lentils. Unsalted nuts, nut butters, and seeds. Unsalted canned beans. Lean cuts of beef with fat trimmed off. Low-sodium, lean deli meat. Dairy Low-fat (1%) or fat-free (skim) milk. Fat-free, low-fat, or reduced-fat cheeses. Nonfat, low-sodium ricotta or cottage cheese. Low-fat or nonfat yogurt. Low-fat, low-sodium cheese. Fats and oils Soft margarine without trans fats. Vegetable oil. Low-fat, reduced-fat, or light mayonnaise and salad dressings (reduced-sodium). Canola, safflower, olive, soybean, and sunflower oils. Avocado. Seasoning and other foods Herbs. Spices. Seasoning mixes without salt. Unsalted popcorn and pretzels. Fat-free sweets. What foods are not recommended? The items listed may not be a complete list. Talk with your dietitian about what dietary choices are best for you. Grains Baked goods made with fat, such as croissants, muffins, or some breads. Dry pasta or rice meal packs. Vegetables Creamed or fried vegetables. Vegetables in a cheese sauce. Regular canned vegetables (not low-sodium or reduced-sodium). Regular canned tomato sauce and paste (not  low-sodium or reduced-sodium). Regular tomato and vegetable juice (not low-sodium or reduced-sodium). Angie Fava. Olives. Fruits Canned fruit in a light or heavy syrup. Fried fruit. Fruit in cream or butter sauce. Meat and other protein foods Fatty cuts of meat. Ribs. Fried meat. Berniece Salines. Sausage. Bologna and other processed lunch meats. Salami. Fatback. Hotdogs. Bratwurst. Salted nuts and seeds. Canned beans with added salt. Canned or smoked fish. Whole eggs or egg yolks. Chicken or [place] with skin. Dairy Whole or 2% milk, cream, and half-and-half. Whole or full-fat cream cheese. Whole-fat or sweetened yogurt. Full-fat cheese. Nondairy creamers. Whipped toppings. Processed cheese and cheese spreads. Fats and oils Butter. Stick margarine. Lard. Shortening. Ghee. Bacon fat. Tropical oils, such as coconut, palm kernel, or palm oil. Seasoning and other foods Salted popcorn and pretzels. Onion salt, garlic salt, seasoned salt, table salt, and sea salt. Worcestershire sauce. Tartar sauce. Barbecue sauce. Teriyaki sauce. Soy sauce, including reduced-sodium. Steak sauce. Canned and packaged gravies. Fish sauce. Oyster sauce. Cocktail sauce. Horseradish that  you find on the shelf. Ketchup. Mustard. Meat flavorings and tenderizers. Bouillon cubes. Hot sauce and Tabasco sauce. Premade or packaged marinades. Premade or packaged taco seasonings. Relishes. Regular salad dressings. Where to find more information:  National Heart, Lung, and [place]: https://wilson-eaton.com/  American Heart Association: www.heart.org Summary  The DASH eating plan is a healthy eating plan that has been shown to reduce high blood pressure (hypertension). It may also reduce your risk for type 2 diabetes, heart disease, and stroke.  With the DASH eating plan, you should limit salt (sodium) intake to 2,300 mg a day. If you have hypertension, you may need to reduce your sodium intake to 1,500 mg a day.  When on the DASH eating plan,  aim to eat more fresh fruits and vegetables, whole grains, lean proteins, low-fat dairy, and heart-healthy fats.  Work with your health care provider or diet and nutrition specialist (dietitian) to adjust your eating plan to your individual calorie needs. This information is not intended to replace advice given to you by your health care provider. Make sure you discuss any questions you have with your health care provider. Document Released: 09/17/2011 Document Revised: 09/21/2016 Document Reviewed: 09/21/2016 Elsevier Interactive Patient Education  2017 [employer].

## 2017-07-15 LAB — TOXASSURE SELECT 13 (MW), URINE

## 2017-08-16 ENCOUNTER — Other Ambulatory Visit: Payer: Self-pay | Admitting: Obstetrics & Gynecology

## 2017-08-16 NOTE — Telephone Encounter (Signed)
Medication refill request: [place] patch  Last AEX:  07-14-16  Next AEX: 11-02-17  Last MMG (if hormonal medication request): 06-16-17 WNL  Refill authorized: please advise

## 2017-08-18 ENCOUNTER — Encounter: Payer: Self-pay | Admitting: Family Medicine

## 2017-08-20 ENCOUNTER — Other Ambulatory Visit: Payer: Self-pay | Admitting: Family Medicine

## 2017-08-20 MED ORDER — CEPHALEXIN 500 MG PO CAPS: 500.0000 mg | ORAL_CAPSULE | Freq: Four times a day (QID) | ORAL | 0 refills | Status: DC

## 2017-08-31 ENCOUNTER — Other Ambulatory Visit: Payer: Self-pay | Admitting: Family Medicine

## 2017-09-01 NOTE — Telephone Encounter (Signed)
May have this +5 additional refills 

## 2017-09-15 ENCOUNTER — Other Ambulatory Visit: Payer: PRIVATE HEALTH INSURANCE

## 2017-09-22 ENCOUNTER — Ambulatory Visit: Payer: PRIVATE HEALTH INSURANCE | Admitting: Oncology

## 2017-09-27 ENCOUNTER — Ambulatory Visit: Payer: PRIVATE HEALTH INSURANCE | Admitting: Family Medicine

## 2017-09-27 ENCOUNTER — Encounter: Payer: Self-pay | Admitting: Family Medicine

## 2017-09-27 VITALS — BP 118/88 | Temp 99.3°F | Ht 63.0 in | Wt 180.0 lb

## 2017-09-27 DIAGNOSIS — B338 Other specified viral diseases: Secondary | ICD-10-CM | POA: Diagnosis not present

## 2017-09-27 DIAGNOSIS — R5383 Other fatigue: Secondary | ICD-10-CM | POA: Diagnosis not present

## 2017-09-27 DIAGNOSIS — J019 Acute sinusitis, unspecified: Secondary | ICD-10-CM

## 2017-09-27 DIAGNOSIS — M25551 Pain in right hip: Secondary | ICD-10-CM

## 2017-09-27 DIAGNOSIS — M25552 Pain in left hip: Secondary | ICD-10-CM | POA: Diagnosis not present

## 2017-09-27 DIAGNOSIS — B348 Other viral infections of unspecified site: Secondary | ICD-10-CM

## 2017-09-27 MED ORDER — HYDROCODONE-HOMATROPINE 5-1.5 MG/5ML PO SYRP: 5.0000 mL | ORAL_SOLUTION | Freq: Four times a day (QID) | ORAL | 0 refills | Status: DC | PRN

## 2017-09-27 MED ORDER — AMOXICILLIN-POT CLAVULANATE 875-125 MG PO TABS: 1.0000 | ORAL_TABLET | Freq: Two times a day (BID) | ORAL | 0 refills | Status: DC

## 2017-09-27 MED ORDER — FLUCONAZOLE 150 MG PO TABS: 150.0000 mg | ORAL_TABLET | Freq: Once | ORAL | 4 refills | Status: AC

## 2017-09-27 NOTE — Progress Notes (Signed)
   Subjective:    Patient ID: Lisa Li, female    DOB: [birth year], 51 y.o.   MRN: [id number]  HPI Patient is here today complaining of productive cough,chest congestion,body aches,bilateral ear pain, headache,fever,sore throat taking Tylenol and cough medications OTC. Patient states that she started off with headaches body aches cough congestion burning in the throat then she started having head congestion drainage coughing chest congestion, keep her sore awake at night she uses her pain medicine during the day to help her with some chronic pain issues  She relates recently having a lot of shoulder pain and pain she has a regular appointment coming up  Review of Systems  Constitutional: Positive for fatigue. Negative for activity change and fever.  HENT: Positive for congestion and rhinorrhea. Negative for ear pain.   Eyes: Negative for discharge.  Respiratory: Positive for cough. Negative for shortness of breath and wheezing.   Cardiovascular: Negative for chest pain.       Objective:   Physical Exam  Constitutional: She appears well-developed.  HENT:  Head: Normocephalic.  Right Ear: External ear normal.  Left Ear: External ear normal.  Nose: Nose normal.  Mouth/Throat: Oropharynx is clear and moist. No oropharyngeal exudate.  Eyes: Right eye exhibits no discharge. Left eye exhibits no discharge.  Neck: Neck supple. No tracheal deviation present.  Cardiovascular: Normal rate and normal heart sounds.  No murmur heard. Pulmonary/Chest: Effort normal and breath sounds normal. She has no wheezes. She has no rales.  Lymphadenopathy:    She has no cervical adenopathy.  Skin: Skin is warm and dry.  Nursing note and vitals reviewed.         Assessment & Plan:  Viral syndrome Parainfluenza Acute rhinosinusitis Hycodan for cough at nighttime only not with her pain medicine Cautioned drowsiness Warning signs were discussed in detail Follow-up if progressive  troubles Antibiotics for the sinus infection.   Other test ordered will review these with her follow-up visit

## 2017-09-30 ENCOUNTER — Telehealth: Payer: Self-pay | Admitting: Family Medicine

## 2017-09-30 NOTE — Telephone Encounter (Signed)
woukd rec cont aame med, a lot of this could be viral , may extend excuse

## 2017-09-30 NOTE — Telephone Encounter (Signed)
Pt was seen 09/27/17, was given Augmentin for 10 days States she still feels rough, note worse but not much better, running a slight fever off & on, now in her head & chest, able to cough up a little stuff  Would like work excuse extended.  Was given a note to return to her job at [employer] on 10/01/17. Would like this extended to return to work 10/09/2017  NTBS again?    Please advise & call pt when ready

## 2017-09-30 NOTE — Telephone Encounter (Signed)
Patient notified and will pick up work extension.

## 2017-10-01 ENCOUNTER — Encounter: Payer: Self-pay | Admitting: Family Medicine

## 2017-10-08 ENCOUNTER — Telehealth: Payer: Self-pay | Admitting: Family Medicine

## 2017-10-08 ENCOUNTER — Encounter: Payer: Self-pay | Admitting: Family Medicine

## 2017-10-08 NOTE — Telephone Encounter (Signed)
As requested she may have a work note to return on Monday

## 2017-10-08 NOTE — Telephone Encounter (Signed)
Patient was seen on the 17th and went back to work today.  Worked a few hours but couldn't make it the whole day.  Has gotten better but is still fatigued and weak. 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 Still on antibiotics.  Has to work two 12 hour shifts this weekend and doesn't think she will be able to make it for the whole time.  Wanted to know if we could extend her work note to return Monday. (original note has her to return to work the 29th.) [place] OF DAY SO SHE CAN HAVE IT FAXED TO WORK.

## 2017-10-09 ENCOUNTER — Other Ambulatory Visit: Payer: Self-pay | Admitting: Family Medicine

## 2017-10-10 LAB — BASIC METABOLIC PANEL
BUN / CREAT RATIO: 17 (ref 9–23)
BUN: 13 mg/dL (ref 6–24)
CALCIUM: 9.3 mg/dL (ref 8.7–10.2)
CO2: 22 mmol/L (ref 20–29)
Chloride: 101 mmol/L (ref 96–106)
Creatinine, Ser: 0.78 mg/dL (ref 0.57–1.00)
GFR, EST AFRICAN AMERICAN: 101 mL/min/{1.73_m2} (ref >59)
GFR, EST NON AFRICAN AMERICAN: 88 mL/min/{1.73_m2} (ref >59)
Glucose: 129 mg/dL — ABNORMAL HIGH (ref 65–99)
POTASSIUM: 4.8 mmol/L (ref 3.5–5.2)
Sodium: 138 mmol/L (ref 134–144)

## 2017-10-10 LAB — CBC WITH DIFFERENTIAL/PLATELET
BASOS: 1 %
Basophils Absolute: 0 10*3/uL (ref 0.0–0.2)
EOS (ABSOLUTE): 0.1 10*3/uL (ref 0.0–0.4)
Eos: 2 %
Hematocrit: 41.3 % (ref 34.0–46.6)
Hemoglobin: 13.1 g/dL (ref 11.1–15.9)
Immature Grans (Abs): 0 10*3/uL (ref 0.0–0.1)
Immature Granulocytes: 0 %
Lymphocytes Absolute: 2.9 10*3/uL (ref 0.7–3.1)
Lymphs: 48 %
MCH: 26.4 pg — AB (ref 26.6–33.0)
MCHC: 31.7 g/dL (ref 31.5–35.7)
MCV: 83 fL (ref 79–97)
MONOS ABS: 0.4 10*3/uL (ref 0.1–0.9)
Monocytes: 6 %
NEUTROS ABS: 2.6 10*3/uL (ref 1.4–7.0)
NEUTROS PCT: 43 %
PLATELETS: 259 10*3/uL (ref 150–379)
RBC: 4.97 x10E6/uL (ref 3.77–5.28)
RDW: 14 % (ref 12.3–15.4)
WBC: 6.1 10*3/uL (ref 3.4–10.8)

## 2017-10-10 LAB — HEPATIC FUNCTION PANEL
ALBUMIN: 4.3 g/dL (ref 3.5–5.5)
ALK PHOS: 36 IU/L — AB (ref 39–117)
ALT: 39 IU/L — ABNORMAL HIGH (ref 0–32)
AST: 34 IU/L (ref 0–40)
BILIRUBIN TOTAL: 0.2 mg/dL (ref 0.0–1.2)
Bilirubin, Direct: 0.07 mg/dL (ref 0.00–0.40)
Total Protein: 6.8 g/dL (ref 6.0–8.5)

## 2017-10-10 LAB — TSH: TSH: 2.14 u[IU]/mL (ref 0.450–4.500)

## 2017-10-10 LAB — MAGNESIUM: Magnesium: 1.9 mg/dL (ref 1.6–2.3)

## 2017-10-10 LAB — SEDIMENTATION RATE: SED RATE: 6 mm/h (ref 0–40)

## 2017-10-10 LAB — C-REACTIVE PROTEIN: CRP: 1.3 mg/L (ref 0.0–4.9)

## 2017-10-10 LAB — HEMOGLOBIN A1C
Est. average glucose Bld gHb Est-mCnc: 146 mg/dL
HEMOGLOBIN A1C: 6.7 % — AB (ref 4.8–5.6)

## 2017-10-15 ENCOUNTER — Ambulatory Visit (INDEPENDENT_AMBULATORY_CARE_PROVIDER_SITE_OTHER): Payer: PRIVATE HEALTH INSURANCE | Admitting: Family Medicine

## 2017-10-15 ENCOUNTER — Encounter: Payer: Self-pay | Admitting: Family Medicine

## 2017-10-15 VITALS — BP 134/90 | Temp 98.2°F | Ht 63.0 in | Wt 184.0 lb

## 2017-10-15 DIAGNOSIS — M25559 Pain in unspecified hip: Secondary | ICD-10-CM | POA: Diagnosis not present

## 2017-10-15 DIAGNOSIS — M5431 Sciatica, right side: Secondary | ICD-10-CM

## 2017-10-15 DIAGNOSIS — M545 Low back pain, unspecified: Secondary | ICD-10-CM

## 2017-10-15 DIAGNOSIS — J019 Acute sinusitis, unspecified: Secondary | ICD-10-CM | POA: Diagnosis not present

## 2017-10-15 DIAGNOSIS — E119 Type 2 diabetes mellitus without complications: Secondary | ICD-10-CM | POA: Diagnosis not present

## 2017-10-15 DIAGNOSIS — M25519 Pain in unspecified shoulder: Secondary | ICD-10-CM

## 2017-10-15 MED ORDER — HYDROCODONE-ACETAMINOPHEN 5-325 MG PO TABS: ORAL_TABLET | ORAL | 0 refills | Status: DC

## 2017-10-15 MED ORDER — AMPHETAMINE-DEXTROAMPHET ER 30 MG PO CP24: 30.0000 mg | ORAL_CAPSULE | ORAL | 0 refills | Status: DC

## 2017-10-15 MED ORDER — BETAMETHASONE DIPROPIONATE 0.05 % EX CREA: TOPICAL_CREAM | Freq: Two times a day (BID) | CUTANEOUS | 5 refills | Status: DC

## 2017-10-15 MED ORDER — CEFPROZIL 500 MG PO TABS: 500.0000 mg | ORAL_TABLET | Freq: Two times a day (BID) | ORAL | 0 refills | Status: DC

## 2017-10-15 NOTE — Progress Notes (Signed)
Subjective:    Patient ID: Lisa Li, female    DOB: 08-30-1966, 52 y.o.   MRN: [id number]  HPI This patient was seen today for chronic pain  The medication list was reviewed and updated.   -Compliance with medication: yes  - Number patient states they take daily: 4 a day  -when was the last dose patient took? today  The patient was advised the importance of maintaining medication and not using illegal substances with these.  Refills needed: yes  The patient was educated that we can provide 3 monthly scripts for their medication, it is their responsibility to follow the instructions.  Side effects or complications from medications: none  Patient is aware that pain medications are meant to minimize the severity of the pain to allow their pain levels to improve to allow for better function. They are aware of that pain medications cannot totally remove their pain.  Due for UDT ( at least once per year) : done sept 2018  Needs FMLA papers filled out.   Follow up on bloodwork results.   Finished antbiotic last week. Still having cough, congestion, headache and scratchy throat. Patient relates some congestion some cough but denies any high fever chills sweats denies wheezing difficulty breathing   Patient does have adult ADD medication does help her focus she denies abusing it states it does improve her overall focus and ability to function  Patient has significant right shoulder pain she also has right pain as well as pain into her hip that radiates down her leg with a burning in the discomfort she would like to see the orthopedic specialist  Patient relates that the area in her back is been given her significant trouble down the right leg for close to 4 months she has been seen previously for this she is tried anti-inflammatories currently taking them she is also tried stretching exercises without success  Review of Systems She relates low back pain right shoulder pain  right hip pain right leg sciatica.  Relates head congestion drainage coughing denies vomiting fevers chills denies diarrhea denies abdominal pain    Objective:   Physical Exam Eardrums normal sinus normal throat is normal neck supple lungs clear heart regular she has subjective discomfort in the right shoulder and right hip positive straight leg raise on the right side negative on the left side no weakness       Assessment & Plan:  1. Controlled type 2 diabetes mellitus without complication, without long-term current use of insulin (HCC) Her diabetes is under good control she needs to watch her diet watch portions try to lose weight continue I recommend some x-rays also MRI of the spine she has had sciatica for several months not getting better with conservative therapy  2. Lumbar pain Significant low back pain causing her to have sciatica down the right leg been present for several months did not respond to conservative measures - DG Lumbar Spine Complete - MR Lumbar Spine Wo Contrast  3. Shoulder pain, unspecified chronicity, unspecified laterality Significant shoulder pain and discomfort - DG Shoulder Right - Ambulatory referral to Orthopedic Surgery  4. Arthralgia of hip, unspecified laterality Significant stiffness in the right hip x-rays ordered continue anti-inflammatory continue stretching exercises - DG HIP UNILAT WITH PELVIS 2-3 VIEWS RIGHT - Ambulatory referral to Orthopedic Surgery  5. Sciatica, right side Did not respond to conservative measures recommend MRI lumbar spine - MR Lumbar Spine Wo Contrast  6. Acute rhinosinusitis Antibiotics prescribed  25  minutes was spent with the patient. Greater than half the time was spent in discussion and answering questions and counseling regarding the issues that the patient came in for today.

## 2017-10-25 DIAGNOSIS — Z0289 Encounter for other administrative examinations: Secondary | ICD-10-CM

## 2017-11-01 ENCOUNTER — Ambulatory Visit
Admission: RE | Admit: 2017-11-01 | Discharge: 2017-11-01 | Disposition: A | Discharge status: Home / Self Care | Payer: PRIVATE HEALTH INSURANCE | Source: Ambulatory Visit | Attending: Family Medicine | Admitting: Family Medicine

## 2017-11-02 ENCOUNTER — Other Ambulatory Visit: Payer: Self-pay

## 2017-11-02 ENCOUNTER — Ambulatory Visit (INDEPENDENT_AMBULATORY_CARE_PROVIDER_SITE_OTHER): Payer: PRIVATE HEALTH INSURANCE | Admitting: Obstetrics & Gynecology

## 2017-11-02 ENCOUNTER — Encounter: Payer: Self-pay | Admitting: Obstetrics & Gynecology

## 2017-11-02 VITALS — BP 142/80 | HR 84 | Resp 16 | Ht 63.0 in | Wt 183.0 lb

## 2017-11-02 DIAGNOSIS — Z01419 Encounter for gynecological examination (general) (routine) without abnormal findings: Secondary | ICD-10-CM | POA: Diagnosis not present

## 2017-11-02 DIAGNOSIS — R350 Frequency of micturition: Secondary | ICD-10-CM | POA: Diagnosis not present

## 2017-11-02 LAB — POCT URINALYSIS DIPSTICK
Bilirubin, UA: NEGATIVE
GLUCOSE UA: NEGATIVE
Ketones, UA: NEGATIVE
Leukocytes, UA: NEGATIVE
NITRITE UA: NEGATIVE
PH UA: 7 (ref 5.0–8.0)
Protein, UA: NEGATIVE
UROBILINOGEN UA: 0.2 U/dL

## 2017-11-02 MED ORDER — ESTRADIOL 0.1 MG/24HR TD PTTW: 1.0000 | MEDICATED_PATCH | TRANSDERMAL | 13 refills | Status: DC

## 2017-11-02 MED ORDER — MIRABEGRON ER 50 MG PO TB24: 50.0000 mg | ORAL_TABLET | Freq: Every day | ORAL | 2 refills | Status: DC

## 2017-11-02 NOTE — Progress Notes (Signed)
52 y.o. G0P0 SingleCaucasianF here for annual exam.  Doing well.  Working full time at [employer].  Had blood work in December with Dr. Wolfgang Phoenix.    Biggest issue for pt over the last several months is urinary urgency.  Reports if she doesn't go immediatly, she will leak some.  Not using mini pads but feels like that is likely something she should be doing.  Does continue to have hot flashes.  Are better but not resolved.  Mostly at nightly. Options for additional treatment discussed.  Has used gabapentin in the past and had hallucinations.   Patient's last menstrual period was 05/31/2013.          Sexually active: No.  The current method of family planning is status post hysterectomy. Exercising: No.  The patient does not participate in regular exercise at present. Smoker:  no  Health Maintenance: Pap:  2013 Neg History of abnormal Pap:  no MMG:  06/16/17 BIRADS1:neg  Colonoscopy:  2018  -- per patient polyp removed f/u 10 years BMD:   2011 Normal TDaP:  2013 Pneumonia vaccine(s):  2015 Shingrix:   never Hep C testing: 07/14/16 Negative Screening Labs: PCP   reports that  has never smoked. she has never used smokeless tobacco. She reports that she does not drink alcohol or use drugs.  Past Medical History:  Diagnosis Date  . Frequent UTI   . Kidney stone   . Meniere's disease 2017   Dr. Benjamine Mola   . MGUS (monoclonal gammopathy of unknown significance)   . Migraine headache   . Myalgia    Chronic  . Pre-diabetes     Past Surgical History:  Procedure Laterality Date  . APPENDECTOMY    . CHOLECYSTECTOMY    . COLONOSCOPY W/ ENDOSCOPIC [place]  09/27/06   02/2011   . CYSTOSCOPY N/A 07/03/2013   Procedure: CYSTOSCOPY;  Surgeon: Lyman Speller, MD;  Location: [facility];  Service: Gynecology;  Laterality: N/A;  . ESOPHAGOGASTRODUODENOSCOPY    . ROBOTIC ASSISTED TOTAL HYSTERECTOMY WITH BILATERAL SALPINGO OOPHERECTOMY Bilateral 07/03/2013   Procedure: ROBOTIC ASSISTED TOTAL  HYSTERECTOMY WITH BILATERAL SALPINGO OOPHORECTOMY;  Surgeon: Lyman Speller, MD;  Location: [facility];  Service: Gynecology;  Laterality: Bilateral;  . TONSILLECTOMY  1973    Current Outpatient Medications  Medication Sig Dispense Refill  . amphetamine-dextroamphetamine (ADDERALL XR) 30 MG 24 hr capsule Take 1 capsule (30 mg total) by mouth every morning. 30 capsule 0  . betamethasone dipropionate (DIPROLENE) 0.05 % cream Apply topically 2 (two) times daily. 60 g 5  . betamethasone dipropionate (DIPROLENE) 0.05 % ointment Apply topically Nightly. Apply topically to vulva nightly for itching. 45 g 4  . chlorzoxazone (PARAFON FORTE DSC) 500 MG tablet Take 1 tablet (500 mg total) by mouth 3 (three) times daily as needed for muscle spasms. 45 tablet 4  . diazepam (VALIUM) 5 MG tablet TAKE 1 TABLET AT BEDTIME AS NEEDED. 30 tablet 5  . diclofenac sodium (VOLTAREN) 1 % GEL Apply 4 g topically 4 (four) times daily. 1 Tube 10  . docusate sodium (COLACE) 100 MG capsule Take 100 mg by mouth as needed.     Marland Kitchen FLUoxetine (PROZAC) 10 MG tablet TAKE (1) TABLET BY MOUTH ONCE DAILY. 30 tablet 5  . HYDROcodone-acetaminophen (NORCO/VICODIN) 5-325 MG tablet Take one every 6 hours prn pain 120 tablet 0  . hydrOXYzine (ATARAX/VISTARIL) 25 MG tablet Take 1 tablet (25 mg total) by mouth Nightly. 30 tablet 0  . lidocaine (XYLOCAINE JELLY) 2 %  jelly Apply 1 application topically as needed. 30 mL 3  . lidocaine (XYLOCAINE) 5 % ointment Apply 1 application topically as needed. 35.44 g 5  . loratadine (CLARITIN) 10 MG tablet Take 10 mg by mouth daily.    . meloxicam (MOBIC) 15 MG tablet TAKE 1 TABLET ONCE DAILY. DO NOT USE ON THE SAME DAY AS NAPROXEN. 30 tablet 5  . metFORMIN (GLUCOPHAGE-XR) 500 MG 24 hr tablet TAKE (1) TABLET BY MOUTH EACH MORNING. 30 tablet 5  . MINIVELLE 0.1 MG/24HR patch PLACE 1 PATCH ONTO THE SKIN 2 TIMES A WEEK 8 patch 3  . omeprazole (PRILOSEC) 40 MG capsule TAKE ONE CAPSULE BY MOUTH ONCE DAILY. 30  capsule 0  . propranolol (INDERAL) 10 MG tablet TAKE 1TABLETS TWICE A DAY AS NEEDED. 60 tablet 5  . rosuvastatin (CRESTOR) 5 MG tablet Take 1 tablet (5 mg total) by mouth daily. 30 tablet 5  . triamcinolone (NASACORT) 55 MCG/ACT AERO nasal inhaler USE 1 SPRAY IN EACH NOSTRIL DAILY. 1 Inhaler 5  . triamcinolone cream (KENALOG) 0.1 % Apply to affected area twice daily as needed 45 g 4   No current facility-administered medications for this visit.     Family History  Problem Relation Age of Onset  . COPD Mother        Deceased, 66  . Prostate cancer Father        Deceased, 65  . Emphysema Sister   . Healthy Brother   . Liver cancer Other        and pancreatic cancer-paternal cousin  . Liver cancer Other        paternal cousin  . Breast cancer Cousin     ROS:  Pertinent items are noted in HPI.  Otherwise, a comprehensive ROS was negative.  Exam:   BP (!) 142/80 (BP Location: Right Arm, Patient Position: Sitting, Cuff Size: Large)   Pulse 84   Resp 16   Ht 5\' 3"  (1.6 m)   Wt 183 lb (83 kg)   LMP 05/31/2013   BMI 32.42 kg/m      Height: 5\' 3"  (160 cm)  Ht Readings from Last 3 Encounters:  11/02/17 5\' 3"  (1.6 m)  10/15/17 5\' 3"  (1.6 m)  09/27/17 5\' 3"  (1.6 m)    General appearance: alert, cooperative and appears stated age Head: Normocephalic, without obvious abnormality, atraumatic Neck: no adenopathy, supple, symmetrical, trachea midline and thyroid normal to inspection and palpation Lungs: clear to auscultation bilaterally Breasts: normal appearance, no masses or tenderness Heart: regular rate and rhythm Abdomen: soft, non-tender; bowel sounds normal; no masses,  no organomegaly Extremities: extremities normal, atraumatic, no cyanosis or edema Skin: Skin color, texture, turgor normal. No rashes or lesions Lymph nodes: Cervical, supraclavicular, and axillary nodes normal. No abnormal inguinal nodes palpated Neurologic: Grossly normal   Pelvic: External genitalia:  no  lesions              Urethra:  normal appearing urethra with no masses, tenderness or lesions              Bartholins and Skenes: normal                 Vagina: normal appearing vagina with normal color and discharge, no lesions              Cervix: no lesions              Pap taken: No. Bimanual Exam:  Uterus:  uterus absent  Adnexa: no mass, fullness, tenderness               Rectovaginal: Confirms               Anus:  normal sphincter tone, no lesions  Chaperone was present for exam.  A:  Well Woman with normal exam PMP, on HRT Urinary urgency H/O type 2 DM ADHD H/O migraines GERD, gastric polyps, IBS, fatty liver disese, h/o elevated liver enzymes H/o TLH/BSO, cystoscopy 9/14--on HRT  P:   Mammogram guidelines reviewed.  Doing 3D. pap smear not indicated. Trial of myrbetriq 50mg  daily.  Rx to pharmacy.  Advised of hypertension risks and need to check BP. Minivelle 0.1mg  patches twice weekly.  #8/12RF Release of records for colonoscopy signed today. Return annually or prn

## 2017-11-08 ENCOUNTER — Other Ambulatory Visit: Payer: Self-pay | Admitting: Family Medicine

## 2017-11-16 ENCOUNTER — Ambulatory Visit
Admission: RE | Admit: 2017-11-16 | Discharge: 2017-11-16 | Disposition: A | Discharge status: Home / Self Care | Payer: PRIVATE HEALTH INSURANCE | Source: Ambulatory Visit | Attending: Family Medicine | Admitting: Family Medicine

## 2017-11-17 ENCOUNTER — Other Ambulatory Visit: Payer: PRIVATE HEALTH INSURANCE

## 2017-11-19 ENCOUNTER — Telehealth: Payer: Self-pay | Admitting: Family Medicine

## 2017-11-19 NOTE — Telephone Encounter (Signed)
Patient said she just completed her MRI at [facility].  She said that the radiologist there has already viewed it and he recommended an injection to be ordered.  She said she can have this injection done this coming up Tuesday, but they need an order faxed over.  She said on the order, it should read "epidural injection with steroids".  Please advise.

## 2017-11-21 NOTE — Telephone Encounter (Signed)
Nurses-please talk with the patient.  I am not a specialist in this area.  She needs to be seen by a doctor who specializes with back issues that also have herniated disc that would either be orthopedics that does the backs such as Dr. Rolena Infante or Dr. Nelva Bush.  In addition to this another option would be neurosurgery.  As for if she has a specific radiologist who is recommending an injection I do not mind improving it this 1 time if I have a specific order from the specialist stating where to have the injection-so she needs to speak with her specialist see if they can provide a specific order for me to review and sign off on it.  As for ongoing injections that would be clearly outside the area of my comfort/expertise

## 2017-11-22 ENCOUNTER — Telehealth: Payer: Self-pay | Admitting: Family Medicine

## 2017-11-22 NOTE — Telephone Encounter (Signed)
Please see previous message- more specific recommendation regarding where the injection would go would be helpful.  Patient has multiple areas on her MRI.  If possible I would like for their office to specifically state where this injection is going.  We will help order this one time.  Ongoing this needs to be through either orthopedics or neurosurgery

## 2017-11-22 NOTE — Telephone Encounter (Signed)
Patient stated Dr Nicki Reaper could speak directly to Dr Jola Baptist tomm (direct) # is [phone]- he is out of office today and he would give all the specifics on the injection and what the order needs to say so she can have the procedure tomm- she said he told her he was injecting the herniated disc but not sure specifically- Dr Jola Baptist reviewed the MRI with her

## 2017-11-22 NOTE — Telephone Encounter (Signed)
Left message to return call 

## 2017-11-22 NOTE — Telephone Encounter (Signed)
Pt called back to correct the phone number she gave [place] earlier.  The correct(direct) # is [phone].

## 2017-11-22 NOTE — Telephone Encounter (Signed)
Patient stated the she would like to get an injection in her back tomorrow by Dr Jola Baptist where she works . Patient states you can call Dr Jola Baptist tomorrow(he not in office today) and he could tell you exactly what to order for her but her can not write the order himself and administer. The number is [phone]. Patient states she doesn't have the time to wait for the specialist and it easier to get it done at her work. Patient is ok with referral to neurosurgery- states she has already seen ortho for her back. Patient would like referral to Dr Vertell Limber or Dr Rita Ohara -whoever you think is best for her situation.

## 2017-11-22 NOTE — Telephone Encounter (Signed)
Rx prior auth APPROVED for pt's amphetamine-dextroamphetamine (ADDERALL XR) 30 MG 24 hr capsule  [id number], valid 11/22/17 - 11/22/18 through [employer]  sent approval to be scanned & filed

## 2017-11-23 ENCOUNTER — Ambulatory Visit
Admission: RE | Admit: 2017-11-23 | Discharge: 2017-11-23 | Disposition: A | Discharge status: Home / Self Care | Payer: PRIVATE HEALTH INSURANCE | Source: Ambulatory Visit | Attending: Family Medicine | Admitting: Family Medicine

## 2017-11-23 ENCOUNTER — Other Ambulatory Visit: Payer: Self-pay | Admitting: Family Medicine

## 2017-11-23 DIAGNOSIS — M545 Low back pain: Principal | ICD-10-CM

## 2017-11-23 DIAGNOSIS — G8929 Other chronic pain: Secondary | ICD-10-CM

## 2017-11-23 MED ORDER — METHYLPREDNISOLONE ACETATE 40 MG/ML INJ SUSP (RADIOLOG
120.0000 mg | Freq: Once | INTRAMUSCULAR | Status: AC
  Administered 2017-11-23: 120 mg via EPIDURAL

## 2017-11-23 MED ORDER — IOPAMIDOL (ISOVUE-M 200) INJECTION 41%
1.0000 mL | Freq: Once | INTRAMUSCULAR | Status: AC
  Administered 2017-11-23: 1 mL via EPIDURAL

## 2017-11-23 NOTE — Telephone Encounter (Signed)
I spoke with specialist regarding this.Dr.Curnes recommends injection-lumbar epidural they will be targeting the L5-S1 region.  We will see how that does for the herniated disc.  Please call [facility] to give a order for lumbar epidural for herniated disc

## 2017-11-23 NOTE — Telephone Encounter (Signed)
Order faxed to [facility] [phone])

## 2017-11-24 NOTE — Addendum Note (Signed)
Addended by: Karle Barr on: 11/24/2017 09:07 AM   Modules accepted: Orders

## 2017-11-26 ENCOUNTER — Telehealth: Payer: Self-pay | Admitting: Obstetrics & Gynecology

## 2017-11-26 NOTE — Telephone Encounter (Signed)
Spoke with patient. Started Myrbetriq 3 wks ago, has developed new symptoms over the last week, is concerned.   Reports clear, thin vaginal d/c, no odor. Lower back pain and abdominal discomfort. Vulvar and labial irritation. Denies fever/chills, "just doesn't feel well".   Recommended OV for further evaluation, advised will review scheduling with Dr. Sabra Heck and return call, patient is agreeable.

## 2017-11-26 NOTE — Telephone Encounter (Signed)
Patient is having problems with a new medication Dr Sabra Heck prescribed for her. Ok to leave a detailed message.

## 2017-11-26 NOTE — Telephone Encounter (Signed)
Routing to Dr. Sabra Heck, will close encounter.

## 2017-11-26 NOTE — Telephone Encounter (Signed)
Review with Dr. Sabra Heck, recommended OV for further evaluation.   Call returned to patient, left detailed message, ok per current dpr. Advised OV recommended, offered OV on 2/18 at 2pm with Dr. Sabra Heck, please return call to office to schedule.

## 2017-11-26 NOTE — Telephone Encounter (Signed)
Left message to call Kaitlyn at [phone]. 

## 2017-11-26 NOTE — Telephone Encounter (Signed)
Patient scheduled for 2:00pm 11/29/17 with Dr.Miller.

## 2017-11-29 ENCOUNTER — Ambulatory Visit: Payer: PRIVATE HEALTH INSURANCE | Admitting: Obstetrics & Gynecology

## 2017-11-29 ENCOUNTER — Other Ambulatory Visit: Payer: Self-pay

## 2017-11-29 ENCOUNTER — Encounter: Payer: Self-pay | Admitting: Obstetrics & Gynecology

## 2017-11-29 ENCOUNTER — Encounter: Payer: Self-pay | Admitting: Family Medicine

## 2017-11-29 VITALS — BP 134/82 | HR 78 | Resp 14 | Wt 179.4 lb

## 2017-11-29 DIAGNOSIS — N3281 Overactive bladder: Secondary | ICD-10-CM | POA: Diagnosis not present

## 2017-11-29 DIAGNOSIS — T887XXA Unspecified adverse effect of drug or medicament, initial encounter: Secondary | ICD-10-CM | POA: Diagnosis not present

## 2017-11-29 DIAGNOSIS — R300 Dysuria: Secondary | ICD-10-CM | POA: Diagnosis not present

## 2017-11-29 DIAGNOSIS — N898 Other specified noninflammatory disorders of vagina: Secondary | ICD-10-CM

## 2017-11-29 DIAGNOSIS — J019 Acute sinusitis, unspecified: Secondary | ICD-10-CM

## 2017-11-29 LAB — POCT URINALYSIS DIPSTICK
BILIRUBIN UA: NEGATIVE
Blood, UA: NEGATIVE
GLUCOSE UA: 100
Ketones, UA: NEGATIVE
Leukocytes, UA: NEGATIVE
Nitrite, UA: NEGATIVE
PH UA: 5 (ref 5.0–8.0)
Protein, UA: NEGATIVE
UROBILINOGEN UA: 0.2 U/dL

## 2017-11-29 MED ORDER — AMOXICILLIN-POT CLAVULANATE 875-125 MG PO TABS: 1.0000 | ORAL_TABLET | Freq: Two times a day (BID) | ORAL | 0 refills | Status: DC

## 2017-11-29 NOTE — Progress Notes (Signed)
GYNECOLOGY  VISIT  CC:   Possible medication side effects  HPI: 52 y.o. G0P0 Single Caucasian female here for several reasons/concerns.  1)  Her stated reason is to discuss possible side effects to the Myrbetriq she just started for OAB symptoms.  Reports this worked well for symptoms but developed vaginal discharge that she feels is related.  Decided to stop the medication and symptoms have mildly improved.  The discharge was associated with a burning vulvar sensation as well.  This has lasted through the weekend but is mildly better today now.  Thinks stopping the medication helped.  Reviewed with pt this is not in the side effect profile for this medication.    2)  Vaginal discharge and vulvar sensation are also associated with a "rough" area that she's felt vaginally.  Feels almost like a ridge.  She noticed it this weekend when she experienced vulvar "burning" sensation.  It is still present today even though the vulvar sensation has mildly improved.  She denies any urinary symptoms except for some increased urgency that has started since stopping the Myrbetriq.  Denies fever, pelvic pain, back pain.  Does feel the "burning sensation" more with urination but thinks that may be due to the skin changes.  3) She also wants to discuss her significant other's ankle/foot issues.  I see her partner has patient as well.  Her partner underwent a bunionectomy which ends up being more extensive than this plan.  She now has significant foot swelling.  Ms. Sackrider feels she needs to see someone else now.  I listened the patient's concerns.  4.)  Lastly, patient is concerned about sinus pressure that is been present for about a week.  She has been experiencing more pressure type headaches in the past week.  She felt this may be also related to the medication.  Through the weekend she began having much more drainage and achiness in her forehead and underneath her eyes.  She is taking over-the-counter medications  without success.  Reports this is just like typical sinus infection for her.  GYNECOLOGIC HISTORY: Patient's last menstrual period was 05/31/2013. Contraception: hysterectomy  Menopausal hormone therapy: minivelle patch   Patient Active Problem List   Diagnosis Date Noted  . Attention deficit disorder (ADD) 05/21/2016  . Fatty liver 05/21/2016  . Painful diabetic neuropathy ([place]) 04/21/2016  . History of colonic polyps 08/23/2015  . Elevated transaminase level 07/26/2015  . Sciatica 07/26/2015  . Other hyperlipidemia 06/19/2014  . Diabetes type 2, controlled ([place]) 02/05/2014  . GERD (gastroesophageal reflux disease) 02/05/2014  . Hypertriglyceridemia 02/05/2014  . Nephrolithiasis 02/05/2014  . Disturbance of skin sensation 10/03/2013  . Migraine 02/15/2013  . 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 Bleeding internal hemorrhoids 03/28/2012    Past Medical History:  Diagnosis Date  . Frequent UTI   . Kidney stone   . Meniere's disease 2017   Dr. Benjamine Mola   . MGUS (monoclonal gammopathy of unknown significance)   . Migraine headache   . Myalgia    Chronic  . Pre-diabetes     Past Surgical History:  Procedure Laterality Date  . APPENDECTOMY    . CHOLECYSTECTOMY    . COLONOSCOPY W/ ENDOSCOPIC [place]  09/27/06   02/2011   . CYSTOSCOPY N/A 07/03/2013   Procedure: CYSTOSCOPY;  Surgeon: Lyman Speller, MD;  Location: [facility];  Service: Gynecology;  Laterality: N/A;  . ESOPHAGOGASTRODUODENOSCOPY    . ROBOTIC ASSISTED TOTAL HYSTERECTOMY WITH BILATERAL SALPINGO OOPHERECTOMY Bilateral 07/03/2013  Procedure: ROBOTIC ASSISTED TOTAL HYSTERECTOMY WITH BILATERAL SALPINGO OOPHORECTOMY;  Surgeon: Lyman Speller, MD;  Location: [facility];  Service: Gynecology;  Laterality: Bilateral;  . TONSILLECTOMY  1973    MEDS:   Current Outpatient Medications on File Prior to Visit  Medication Sig Dispense Refill  . amphetamine-dextroamphetamine (ADDERALL XR) 30 MG 24 hr capsule Take 1 capsule (30 mg  total) by mouth every morning. 30 capsule 0  . betamethasone dipropionate (DIPROLENE) 0.05 % cream Apply topically 2 (two) times daily. 60 g 5  . betamethasone dipropionate (DIPROLENE) 0.05 % ointment Apply topically Nightly. Apply topically to vulva nightly for itching. 45 g 4  . chlorzoxazone (PARAFON FORTE DSC) 500 MG tablet Take 1 tablet (500 mg total) by mouth 3 (three) times daily as needed for muscle spasms. 45 tablet 4  . diazepam (VALIUM) 5 MG tablet TAKE 1 TABLET AT BEDTIME AS NEEDED. 30 tablet 5  . diclofenac sodium (VOLTAREN) 1 % GEL Apply 4 g topically 4 (four) times daily. 1 Tube 10  . docusate sodium (COLACE) 100 MG capsule Take 100 mg by mouth as needed.     Marland Kitchen estradiol (MINIVELLE) 0.1 MG/24HR patch Place 1 patch (0.1 mg total) onto the skin 2 (two) times a week. 8 patch 13  . FLUoxetine (PROZAC) 10 MG tablet TAKE (1) TABLET BY MOUTH ONCE DAILY. 30 tablet 5  . HYDROcodone-acetaminophen (NORCO/VICODIN) 5-325 MG tablet Take one every 6 hours prn pain 120 tablet 0  . hydrOXYzine (ATARAX/VISTARIL) 25 MG tablet Take 1 tablet (25 mg total) by mouth Nightly. (Patient taking differently: Take 25 mg by mouth as needed. ) 30 tablet 0  . lidocaine (XYLOCAINE JELLY) 2 % jelly Apply 1 application topically as needed. 30 mL 3  . lidocaine (XYLOCAINE) 5 % ointment Apply 1 application topically as needed. 35.44 g 5  . loratadine (CLARITIN) 10 MG tablet Take 10 mg by mouth daily.    . meloxicam (MOBIC) 15 MG tablet TAKE 1 TABLET ONCE DAILY. DO NOT USE ON THE SAME DAY AS NAPROXEN. 30 tablet 5  . metFORMIN (GLUCOPHAGE-XR) 500 MG 24 hr tablet TAKE (1) TABLET BY MOUTH EACH MORNING. 30 tablet 5  . mirabegron ER (MYRBETRIQ) 50 MG TB24 tablet Take 1 tablet (50 mg total) by mouth daily. 30 tablet 2  . omeprazole (PRILOSEC) 40 MG capsule TAKE ONE CAPSULE BY MOUTH ONCE DAILY. 30 capsule 0  . propranolol (INDERAL) 10 MG tablet TAKE 1TABLETS TWICE A DAY AS NEEDED. 60 tablet 5  . rosuvastatin (CRESTOR) 5 MG  tablet Take 1 tablet (5 mg total) by mouth daily. 30 tablet 5  . triamcinolone (NASACORT) 55 MCG/ACT AERO nasal inhaler USE 1 SPRAY IN EACH NOSTRIL DAILY. 1 Inhaler 5  . triamcinolone cream (KENALOG) 0.1 % Apply to affected area twice daily as needed 45 g 4   No current facility-administered medications on file prior to visit.     ALLERGIES: Triptans; Pravastatin; Gabapentin; and Topamax [topiramate]  Family History  Problem Relation Age of Onset  . COPD Mother        Deceased, 76  . Prostate cancer Father        Deceased, 16  . Emphysema Sister   . Healthy Brother   . Liver cancer Other        and pancreatic cancer-paternal cousin  . Liver cancer Other        paternal cousin  . Breast cancer Cousin     SH: Partner, non-smoker  Review of Systems  Constitutional: Negative.   Cardiovascular: Negative.   Gastrointestinal: Negative.   Genitourinary:       Vaginal discharge, vaginal "burning sensation"    PHYSICAL EXAMINATION:    BP 134/82 (BP Location: Right Arm, Patient Position: Sitting, Cuff Size: Normal)   Pulse 78   Resp 14   Wt 179 lb 6.4 oz (81.4 kg)   LMP 05/31/2013   BMI 31.78 kg/m     Physical Exam  Constitutional: She is oriented to person, place, and time. She appears well-developed and well-nourished.  GI: Soft. Bowel sounds are normal.  Genitourinary: There is no rash, tenderness, lesion or injury on the right labia. Right adnexum displays tenderness. Right adnexum displays no mass and no fullness. Left adnexum displays no mass, no tenderness and no fullness. Vaginal discharge (watery) found.  Genitourinary Comments: Also noted is a tender ridge within the vagina on the patient's right side, slightly anteriorly.  Visually, this looks like normal vaginal tissue and is only felt on bimanual exam.  Uterus/cervix are surgically absent.  Lymphadenopathy:       Right: No inguinal adenopathy present.       Left: No inguinal adenopathy present.  Neurological:  She is alert and oriented to person, place, and time.  Skin: Skin is warm and dry.  Psychiatric: She has a normal mood and affect.   Chaperone was present for exam.  Assessment: OAB with side effects that pt feels are related but I am unsure about at this time Vaginal discharge Vaginal "ridge" possible due to vaginitis.  Do not feel any of this tissue needs to be biopsied as has no visual change in appearance. Sinusitis  Plan: Urine culture obtained. Affirm obtained.  Will treat if any vaginitis is present.   Augmentin 875 BID x 7days.  May need diflucan after treatment.   ~Lenghty visit with pt considering stated reason for visit.  Almost 40 minutes spent with pt in face to face discussion.

## 2017-11-30 ENCOUNTER — Telehealth: Payer: Self-pay | Admitting: *Deleted

## 2017-11-30 ENCOUNTER — Other Ambulatory Visit: Payer: Self-pay | Admitting: *Deleted

## 2017-11-30 LAB — VAGINITIS/VAGINOSIS, DNA PROBE
Candida Species: NEGATIVE
Gardnerella vaginalis: POSITIVE — AB
TRICHOMONAS VAG: NEGATIVE

## 2017-11-30 MED ORDER — TINIDAZOLE 500 MG PO TABS: 1.0000 g | ORAL_TABLET | Freq: Every day | ORAL | 0 refills | Status: AC

## 2017-11-30 NOTE — Telephone Encounter (Signed)
Spoke with patient, advised as seen below per Dr. Sabra Heck. Rx to verified pharmacy. Patient verbalizes understanding and is agreeable. Will close encounter.

## 2017-11-30 NOTE — Telephone Encounter (Signed)
-----   Message from Megan Salon, MD sent at 11/30/2017 12:48 PM EST ----- Please let pt know BV present.  Ok to treat with Metrogel 2.02%, one applicator QHS x 5 nights OR flagyl 500mg  bid x 7 days.  If pt chooses oral medication, please advise no ETOH while on medication.  No additional follow up if symptoms resolved.  She could consider restarting the Myrbetriq at that time.

## 2017-11-30 NOTE — Telephone Encounter (Signed)
Notes recorded by Burnice Logan, RN on 11/30/2017 at 1:39 PM EST Spoke with patient, advised as seen below per Dr. Sabra Heck. Patient request tindamax RX instead of flagyl po. Advised will review with Dr. Sabra Heck and return call. Confirmed pharmacy on file. Patient verbalizes understanding and is agreeable.   See telephone encounter created to review with provider.   Dr. Sabra Heck -please advise on Tindamax RX.

## 2017-11-30 NOTE — Telephone Encounter (Signed)
Tindamax dosing is 1gm po daily x 5 days.  Ok to send into her pharmacy of choice.  Thanks.

## 2017-12-01 LAB — URINE CULTURE

## 2017-12-03 ENCOUNTER — Other Ambulatory Visit: Payer: Self-pay | Admitting: Family Medicine

## 2017-12-13 ENCOUNTER — Telehealth: Payer: Self-pay | Admitting: Obstetrics & Gynecology

## 2017-12-13 MED ORDER — FLUCONAZOLE 150 MG PO TABS: ORAL_TABLET | ORAL | 0 refills | Status: DC

## 2017-12-13 NOTE — Telephone Encounter (Signed)
Patient would like to speak with nurse about getting a prescription for diflucan.

## 2017-12-13 NOTE — Telephone Encounter (Signed)
Reviewed with Dr. Sabra Heck, ok to send Diflucan 150 mg po, take one tablet now, repeat in 48 hrs. #2/0RF.  Call returned to patient, no answer, left detailed message, ok per dpr. Advised Rx for diflucan to [place], return call to office with any additional questions.  Routing to provider for final review. Patient is agreeable to disposition. Will close encounter.

## 2017-12-13 NOTE — Telephone Encounter (Signed)
Left message to call Jawuan Robb at [phone].  

## 2017-12-13 NOTE — Telephone Encounter (Signed)
Spoke with patient, seen in office on 11/29/17, started on Augmentin, calling for diflucan.  Reports symptoms started on 3/1, increasing vaginal itching, feels "raw", no d/c or odor. Completed tindamax for BV. Patient requesting 3 tabs of Diflucan. Advised patient Diflucan typically prescribed #2 tab, take one tab now, repeat in 72 hrs if symptoms still present. Advised will review with Dr. Sabra Heck and return call, patient agreeable.   RX pended  Dr. Sabra Heck -ok to send RX for diflucan 150 mg po take one tab now, repeat in 72 hrs if symptoms still present, #2/0RF?

## 2017-12-27 ENCOUNTER — Other Ambulatory Visit: Payer: Self-pay | Admitting: Family Medicine

## 2018-01-13 ENCOUNTER — Other Ambulatory Visit: Payer: Self-pay | Admitting: Neurosurgery

## 2018-01-13 ENCOUNTER — Ambulatory Visit
Admission: RE | Admit: 2018-01-13 | Discharge: 2018-01-13 | Disposition: A | Discharge status: Home / Self Care | Payer: PRIVATE HEALTH INSURANCE | Source: Ambulatory Visit | Attending: Neurosurgery | Admitting: Neurosurgery

## 2018-01-13 DIAGNOSIS — M545 Low back pain: Secondary | ICD-10-CM

## 2018-01-14 ENCOUNTER — Ambulatory Visit (INDEPENDENT_AMBULATORY_CARE_PROVIDER_SITE_OTHER): Payer: PRIVATE HEALTH INSURANCE | Admitting: Family Medicine

## 2018-01-14 ENCOUNTER — Encounter: Payer: Self-pay | Admitting: Family Medicine

## 2018-01-14 VITALS — BP 134/86 | Ht 63.0 in | Wt 180.0 lb

## 2018-01-14 DIAGNOSIS — R7401 Elevation of levels of liver transaminase levels: Secondary | ICD-10-CM

## 2018-01-14 DIAGNOSIS — E7849 Other hyperlipidemia: Secondary | ICD-10-CM | POA: Diagnosis not present

## 2018-01-14 DIAGNOSIS — M545 Low back pain, unspecified: Secondary | ICD-10-CM

## 2018-01-14 DIAGNOSIS — E119 Type 2 diabetes mellitus without complications: Secondary | ICD-10-CM | POA: Diagnosis not present

## 2018-01-14 DIAGNOSIS — R740 Nonspecific elevation of levels of transaminase and lactic acid dehydrogenase [LDH]: Secondary | ICD-10-CM | POA: Diagnosis not present

## 2018-01-14 MED ORDER — AMOXICILLIN-POT CLAVULANATE 875-125 MG PO TABS: 1.0000 | ORAL_TABLET | Freq: Two times a day (BID) | ORAL | 0 refills | Status: DC

## 2018-01-14 MED ORDER — HYDROCODONE-ACETAMINOPHEN 5-325 MG PO TABS: ORAL_TABLET | ORAL | 0 refills | Status: DC

## 2018-01-14 MED ORDER — FLUCONAZOLE 150 MG PO TABS: ORAL_TABLET | ORAL | 2 refills | Status: DC

## 2018-01-14 MED ORDER — AMPHETAMINE-DEXTROAMPHET ER 30 MG PO CP24: 30.0000 mg | ORAL_CAPSULE | ORAL | 0 refills | Status: DC

## 2018-01-14 NOTE — Progress Notes (Signed)
Subjective:    Patient ID: Lisa Li, female    DOB: [birth year], 52 y.o.   MRN: [id number]  HPI This patient was seen today for chronic pain  The medication list was reviewed and updated.   -Compliance with medication: yes  - Number patient states they take daily: 3-4  -when was the last dose patient took? Today about 1 pm  The patient was advised the importance of maintaining medication and not using illegal substances with these.  Here for refills and follow up  The patient was educated that we can provide 3 monthly scripts for their medication, it is their responsibility to follow the instructions.  Side effects or complications from medications: none  Patient is aware that pain medications are meant to minimize the severity of the pain to allow their pain levels to improve to allow for better function. They are aware of that pain medications cannot totally remove their pain.  Due for UDT ( at least once per year) : 07/09/2017  Patient was adopted as well.  She has a hard time staying on track it affects her job performance and home For an impaction quality of care/quality of life  Patient denies being depressed or anxious patient with history of  Fatty liver but not under good control for her pain she is requesting increased pain medication to help this we discussed possibility of 7.5 mg 4 times daily for the 5 mg 5 times daily therefore we will go to 5 mg 5 times daily this should be a safe amount of Tylenol  Patient with low back pain sciatica following up with specialist there talked about doing injection  Pt states she may have a sinus infection. Has headache, cough, sore throat significant head congestion drainage coughing sinus pressure present for the past 2 days    Review of Systems  Constitutional: Negative for activity change, appetite change, fatigue and fever.  HENT: Positive for congestion, rhinorrhea, sinus pressure and sinus pain.   Respiratory:  Positive for cough. Negative for shortness of breath.   Cardiovascular: Negative for chest pain and leg swelling.  Gastrointestinal: Negative for abdominal pain and vomiting.  Skin: Negative for color change.  Neurological: Negative for weakness.  Psychiatric/Behavioral: Negative for confusion.       Objective:   Physical Exam  Constitutional: She appears well-developed.  HENT:  Head: Normocephalic.  Right Ear: External ear normal.  Left Ear: External ear normal.  Nose: Nose normal.  Mouth/Throat: Oropharynx is clear and moist. No oropharyngeal exudate.  Eyes: Right eye exhibits no discharge. Left eye exhibits no discharge.  Neck: Neck supple. No tracheal deviation present.  Cardiovascular: Normal rate and normal heart sounds.  No murmur heard. Pulmonary/Chest: Effort normal and breath sounds normal. She has no wheezes. She has no rales.  Lymphadenopathy:    She has no cervical adenopathy.  Skin: Skin is warm and dry.  Nursing note and vitals reviewed.         Assessment & Plan:  Hyperlipidemia check lipid profile to continue medication watch diet  Patient on multiple medications kidney function therefore check metabolic 7 await results  Prediabetes/diabetes important continue medication watch diet exercise try to lose weight  The liver check liver function await results alsokeepTylenol less than 3000 mg/day  Adult ADD 3 prescriptions given drug registry checked-patient was encouraged to watch diet exercise try to lose weight  Chronic pain The patient was seen today as part of a comprehensive visit regarding pain control. Patient's compliance  with the medication as well as discussion regarding effectiveness was completed. Prescriptions were written. Patient was advised to follow-up in 3 months. The patient was assessed for any signs of severe side effects. The patient was advised to take the medicine as directed and to report to [place] if any side effect issues. Medication  adjusted to where she can take 5/day as directed on days where the pain is not as bad only take 3 or 4/day follow-up in 3 months

## 2018-01-18 ENCOUNTER — Other Ambulatory Visit: Payer: Self-pay | Admitting: Neurosurgery

## 2018-01-18 DIAGNOSIS — M5136 Other intervertebral disc degeneration, lumbar region: Secondary | ICD-10-CM

## 2018-01-25 ENCOUNTER — Ambulatory Visit
Admission: RE | Admit: 2018-01-25 | Discharge: 2018-01-25 | Disposition: A | Discharge status: Home / Self Care | Payer: PRIVATE HEALTH INSURANCE | Source: Ambulatory Visit | Attending: Neurosurgery | Admitting: Neurosurgery

## 2018-01-25 DIAGNOSIS — M5136 Other intervertebral disc degeneration, lumbar region: Secondary | ICD-10-CM

## 2018-01-25 MED ORDER — IOPAMIDOL (ISOVUE-M 200) INJECTION 41%
1.0000 mL | Freq: Once | INTRAMUSCULAR | Status: AC
  Administered 2018-01-25: 1 mL via EPIDURAL

## 2018-01-25 MED ORDER — METHYLPREDNISOLONE ACETATE 40 MG/ML INJ SUSP (RADIOLOG
120.0000 mg | Freq: Once | INTRAMUSCULAR | Status: AC
  Administered 2018-01-25: 120 mg via EPIDURAL

## 2018-02-08 ENCOUNTER — Other Ambulatory Visit: Payer: Self-pay | Admitting: Family Medicine

## 2018-02-08 NOTE — Telephone Encounter (Signed)
6 refills each

## 2018-03-10 ENCOUNTER — Other Ambulatory Visit: Payer: Self-pay | Admitting: Family Medicine

## 2018-03-11 ENCOUNTER — Other Ambulatory Visit: Payer: Self-pay | Admitting: Neurosurgery

## 2018-03-11 ENCOUNTER — Ambulatory Visit
Admission: RE | Admit: 2018-03-11 | Discharge: 2018-03-11 | Disposition: A | Discharge status: Home / Self Care | Payer: PRIVATE HEALTH INSURANCE | Source: Ambulatory Visit | Attending: Neurosurgery | Admitting: Neurosurgery

## 2018-03-11 DIAGNOSIS — M25552 Pain in left hip: Secondary | ICD-10-CM

## 2018-04-08 LAB — LIPID PANEL
CHOL/HDL RATIO: 5.3 ratio — AB (ref 0.0–4.4)
Cholesterol, Total: 238 mg/dL — ABNORMAL HIGH (ref 100–199)
HDL: 45 mg/dL (ref >39)
LDL CALC: 141 mg/dL — AB (ref 0–99)
Triglycerides: 260 mg/dL — ABNORMAL HIGH (ref 0–149)
VLDL Cholesterol Cal: 52 mg/dL — ABNORMAL HIGH (ref 5–40)

## 2018-04-08 LAB — BASIC METABOLIC PANEL
BUN / CREAT RATIO: 15 (ref 9–23)
BUN: 11 mg/dL (ref 6–24)
CALCIUM: 9.8 mg/dL (ref 8.7–10.2)
CHLORIDE: 101 mmol/L (ref 96–106)
CO2: 25 mmol/L (ref 20–29)
CREATININE: 0.71 mg/dL (ref 0.57–1.00)
GFR calc Af Amer: 113 mL/min/{1.73_m2} (ref >59)
GFR calc non Af Amer: 98 mL/min/{1.73_m2} (ref >59)
GLUCOSE: 128 mg/dL — AB (ref 65–99)
POTASSIUM: 4.8 mmol/L (ref 3.5–5.2)
SODIUM: 139 mmol/L (ref 134–144)

## 2018-04-08 LAB — HEPATIC FUNCTION PANEL
ALBUMIN: 4.7 g/dL (ref 3.5–5.5)
ALT: 49 IU/L — AB (ref 0–32)
AST: 38 IU/L (ref 0–40)
Alkaline Phosphatase: 43 IU/L (ref 39–117)
BILIRUBIN, DIRECT: 0.09 mg/dL (ref 0.00–0.40)
Bilirubin Total: 0.3 mg/dL (ref 0.0–1.2)
TOTAL PROTEIN: 7.4 g/dL (ref 6.0–8.5)

## 2018-04-08 LAB — HEMOGLOBIN A1C
Est. average glucose Bld gHb Est-mCnc: 151 mg/dL
Hgb A1c MFr Bld: 6.9 % — ABNORMAL HIGH (ref 4.8–5.6)

## 2018-04-12 ENCOUNTER — Encounter: Payer: Self-pay | Admitting: Family Medicine

## 2018-04-12 ENCOUNTER — Ambulatory Visit (INDEPENDENT_AMBULATORY_CARE_PROVIDER_SITE_OTHER): Payer: Managed Care, Other (non HMO) | Admitting: Family Medicine

## 2018-04-12 VITALS — BP 122/80 | Ht 63.0 in | Wt 175.0 lb

## 2018-04-12 DIAGNOSIS — M5432 Sciatica, left side: Secondary | ICD-10-CM | POA: Diagnosis not present

## 2018-04-12 DIAGNOSIS — F988 Other specified behavioral and emotional disorders with onset usually occurring in childhood and adolescence: Secondary | ICD-10-CM | POA: Diagnosis not present

## 2018-04-12 DIAGNOSIS — M791 Myalgia, unspecified site: Secondary | ICD-10-CM

## 2018-04-12 DIAGNOSIS — M5431 Sciatica, right side: Secondary | ICD-10-CM | POA: Diagnosis not present

## 2018-04-12 DIAGNOSIS — E119 Type 2 diabetes mellitus without complications: Secondary | ICD-10-CM | POA: Diagnosis not present

## 2018-04-12 DIAGNOSIS — E7849 Other hyperlipidemia: Secondary | ICD-10-CM | POA: Diagnosis not present

## 2018-04-12 MED ORDER — AMPHETAMINE-DEXTROAMPHET ER 30 MG PO CP24: 30.0000 mg | ORAL_CAPSULE | ORAL | 0 refills | Status: DC

## 2018-04-12 MED ORDER — HYDROCODONE-ACETAMINOPHEN 5-325 MG PO TABS: ORAL_TABLET | ORAL | 0 refills | Status: DC

## 2018-04-12 NOTE — Progress Notes (Addendum)
Subjective:    Patient ID: Lisa Li, female    DOB: Sep 12, 1966, 52 y.o.   MRN: [id number]  HPI  This patient was seen today for chronic pain  The medication list was reviewed and updated. He has significant muscle aches with Crestor  -Compliance with medication: yes  - Number patient states they take daily: 5 a day  -when was the last dose patient took? today  The patient was advised the importance of maintaining medication and not using illegal substances with these.  Here for refills and follow up  The patient was educated that we can provide 3 monthly scripts for their medication, it is their responsibility to follow the instructions.  Side effects or complications from medications: none  Patient is aware that pain medications are meant to minimize the severity of the pain to allow their pain levels to improve to allow for better function. They are aware of that pain medications cannot totally remove their pain.  Due for UDT ( at least once per year) : 06/2017  This patient has adult ADD. Takes medication responsibly. Medication does help the patient focus in be more functional. Patient relates that they are or not abusing the medication or misusing the medication. The patient understands that if they're having any negative side effects such as elevated high blood pressure severe headaches they would need stop the medication follow-up immediately. They also understand that the prescriptions are to last for 3 months then the patient will need to follow-up before having further prescriptions.  Patient has back problems arthritis takes pain medication does help her function specialist is wanting to do surgery patient does not want to do surgery currently she is toughing it out as best she can  Diabetes fair control she is trying to watch her diet trying to stay active cholesterol poor control did not tolerate statins have tried to separate statins without success The patient  was seen today as part of a comprehensive diabetic check up.the patient does have diabetes.  The patient follows here on a regular basis.  The patient relates medication compliance. No significant side effects to the medications. Denies any low glucose spells. Relates compliance with diet to a reasonable level. Patient does do labwork intermittently and understands the dangers of diabetes.   The patient is currently not on a statin   Review of Systems  Constitutional: Negative for activity change, appetite change and fatigue.  HENT: Negative for congestion and rhinorrhea.   Respiratory: Negative for cough.   Cardiovascular: Negative for chest pain and leg swelling.  Gastrointestinal: Negative for abdominal pain and constipation.  Endocrine: Negative for polydipsia and polyphagia.  Musculoskeletal: Positive for arthralgias and back pain.  Skin: Negative for color change.  Neurological: Negative for dizziness and weakness.  Psychiatric/Behavioral: Negative for agitation and confusion.   General no acute distress vital signs reviewed head atraumatic neck atraumatic move deviation of the trachea lungs are clear no crackles heart is regular no murmurs abdomen soft  mildly overweight extremities no edema skin warm dry neurologic grossly normal           Assessment & Plan:  The patient was seen today as part of a comprehensive visit for diabetes. The importance of keeping her A1c at or below 7 was discussed.  Importance of regular physical activity was discussed.   The importance of adherence to medication as well as a controlled low starch/sugar diet was also discussed.  Standard follow-up visit recommended.  Also patient aware  failure to keep diabetes under control increases the risk of complications.  The patient was seen today as part of an evaluation regarding hyperlipidemia.  Recent lab work has been reviewed with the patient as well as the goals for good cholesterol care.  In  addition to this medications have been discussed the importance of compliance with diet and medications discussed as well.  Finally the patient is aware that poor control of cholesterol, noncompliance can dramatically increase the risk of complications. The patient will keep regular office visits and the patient does agreed to periodic lab work. Unable to tolerate statins we will look into trying injectable Praluent  ADD doing well with medication helps her focus not abusing the medicine drug registry was checked the importance of taking the medicine on a regular basis discussed continue medication 3 prescriptions given  The patient was seen in followup for chronic pain. A review over at their current pain status was discussed. Drug registry was checked. Prescriptions were given. Discussion was held regarding the importance of compliance with medication as well as pain medication contract.  Time for questions regarding pain management plan occurred. Importance of regular followup visits was discussed. Patient was informed that medication may cause drowsiness and should not be combined  with other medications/alcohol or street drugs. Patient was cautioned that medication could cause drowsiness. If the patient feels medication is causing altered alertness then do not drive or operate dangerous equipment.  25 minutes was spent with the patient.  This statement verifies that 25 minutes was indeed spent with the patient.  More than 50% of this visit-total duration of the visit-was spent in counseling and coordination of care. The issues that the patient came in for today as reflected in the diagnosis (s) please refer to documentation for further details.  The patient is concerned about new medications for diabetes and how it may affect her diverticulosis currently the patient having some minimal lower abdominal tenderness she will watch it may need to be on a round of antibiotics she will work hard on  diet exercise she will check lab work before her next visit she will try to get her A1c down closer to 6.5

## 2018-04-15 ENCOUNTER — Telehealth: Payer: Self-pay | Admitting: Family Medicine

## 2018-04-15 ENCOUNTER — Other Ambulatory Visit: Payer: Self-pay | Admitting: Family Medicine

## 2018-04-15 NOTE — Telephone Encounter (Signed)
Left message for patient to return call. Need to know what pharmacy pt would like this sent to.

## 2018-04-15 NOTE — Telephone Encounter (Signed)
The patient cannot tolerate statins.  She is tried to separate statins.  She is diabetic her LDL significantly elevated  The patient is requesting Praluent 75 mg subcutaneous injection every 2 weeks The prescription is for 2 prefilled syringes with 12 refills  I told the patient we can send this into her pharmacy and it may or may not get covered because of what ever her prescription plan states she is aware of this  She is aware that you will be sending this and thank you

## 2018-04-18 ENCOUNTER — Other Ambulatory Visit: Payer: Self-pay | Admitting: *Deleted

## 2018-04-18 MED ORDER — ALIROCUMAB 75 MG/ML ~~LOC~~ SOPN: 75.0000 mg | PEN_INJECTOR | SUBCUTANEOUS | 12 refills | Status: DC

## 2018-04-18 NOTE — Telephone Encounter (Signed)
Discussed with pt. Pt wants to use [place] [facility]. Med sent to pharm.

## 2018-04-19 ENCOUNTER — Telehealth: Payer: Self-pay | Admitting: Family Medicine

## 2018-04-19 NOTE — Telephone Encounter (Signed)
Pt aware dr Nicki Reaper will review next week

## 2018-04-19 NOTE — Telephone Encounter (Signed)
PA denied for Praluent 75mg /ml. Request denied due to patient not meeting clinical requirements. Pt requesting this med due to intolerance of statins.

## 2018-04-19 NOTE — Telephone Encounter (Signed)
This will need to go to dr Nicki Reaper

## 2018-04-24 NOTE — Telephone Encounter (Signed)
It is mainly because this medication is approximately $12-$15,000 per year.  Therefore [employer] do not cover it unless you have a underlying history of heart blockages.    I would recommend submitting a prior authorization form that I can help to fill out.  If this gets rejected then we as a practice are out of options-please continue to read   the patient does have the option of being referred to the cholesterol clinic through cardiology.  Sometimes they are able to get these medications approved.  Often it depends on the way the insurance plan is written.  She can call her Environmental consultant to see if this medication is covered in her circumstance.  Also we can submit a form for prior approval but if it gets rejected we do not have any recourse from our perspective.  Certainly the patient can always appeal any insurance decision.

## 2018-04-25 ENCOUNTER — Telehealth: Payer: Self-pay | Admitting: Obstetrics & Gynecology

## 2018-04-25 NOTE — Telephone Encounter (Signed)
Spoke with patient.   1. Advised of estradiol RX, will fill as generic.   2. Patient requesting RX for vaginal itching, discomfort and feeling "raw". Symptoms started on 7/8, feels like they did last time she had BV. Denies vaginal d/c, odor, urinary symptoms, bleeding. Recommended OV for further evaluation, OV scheduled for 7/19 at 4pm. Patient declined earlier OV.   Routing to provider for final review. Patient is agreeable to disposition. Will close encounter.

## 2018-04-25 NOTE — Telephone Encounter (Signed)
[employer], Pt's POA, is requesting a change in medication due to the cost.

## 2018-04-25 NOTE — Telephone Encounter (Signed)
Left message to return call 

## 2018-04-25 NOTE — Telephone Encounter (Signed)
Spoke with patient. Patient states Minivelle 0.1mg  patch more expensive with new insurance plan, [phone], asking for alternative. Patient has not compared cost at pharmacy, unsure of generic out of pocket cost.  Advised will f/u with pharmacy regarding alternative and return call. Patient agreeable.

## 2018-04-25 NOTE — Telephone Encounter (Signed)
Spoke with [place] at [facility]. Was advised patients copay for estradiol 0.1mg  patch would be $10 for 30 day supply. Fill as generic, advised will notify patient.

## 2018-04-29 ENCOUNTER — Ambulatory Visit: Payer: Managed Care, Other (non HMO) | Admitting: Obstetrics & Gynecology

## 2018-04-29 ENCOUNTER — Encounter: Payer: Self-pay | Admitting: Obstetrics & Gynecology

## 2018-04-29 VITALS — BP 126/80 | HR 88 | Resp 16 | Ht 63.0 in | Wt 177.6 lb

## 2018-04-29 DIAGNOSIS — N898 Other specified noninflammatory disorders of vagina: Secondary | ICD-10-CM | POA: Diagnosis not present

## 2018-04-29 MED ORDER — TINIDAZOLE 500 MG PO TABS: ORAL_TABLET | ORAL | 0 refills | Status: DC

## 2018-04-29 MED ORDER — METRONIDAZOLE 0.75 % VA GEL: 1.0000 | Freq: Every day | VAGINAL | 0 refills | Status: DC

## 2018-04-29 NOTE — Progress Notes (Signed)
GYNECOLOGY  VISIT  CC:   Vaginal irritation  HPI: 52 y.o. G0P0 Single Caucasian female here for vaginal itching and irritation x 11 days.  Also has noted vulvar swelling and what feels like little tears.  Not having any significant discharge.  Having a little pelvic discomfort.  Denies urinary symptoms.  No fever.  Having back pain due to current lumbar issues.  Seeing Dr. Rita Ohara for this.  Having epidural injections for pain.  Surgery has been recommended.    Denies vaginal bleeding.    GYNECOLOGIC HISTORY: Patient's last menstrual period was 05/31/2013. Contraception: abstinence Menopausal hormone therapy: minivelle  Patient Active Problem List   Diagnosis Date Noted  . Attention deficit disorder (ADD) 05/21/2016  . Fatty liver 05/21/2016  . Painful diabetic neuropathy ([place]) 04/21/2016  . History of colonic polyps 08/23/2015  . Elevated transaminase level 07/26/2015  . Sciatica 07/26/2015  . Other hyperlipidemia 06/19/2014  . Diabetes type 2, controlled ([place]) 02/05/2014  . GERD (gastroesophageal reflux disease) 02/05/2014  . Hypertriglyceridemia 02/05/2014  . Nephrolithiasis 02/05/2014  . Disturbance of skin sensation 10/03/2013  . Migraine 02/15/2013  . 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 Bleeding internal hemorrhoids 03/28/2012    Past Medical History:  Diagnosis Date  . Frequent UTI   . Kidney stone   . Meniere's disease 2017   Dr. Benjamine Mola   . MGUS (monoclonal gammopathy of unknown significance)   . Migraine headache   . Myalgia    Chronic  . Pre-diabetes     Past Surgical History:  Procedure Laterality Date  . APPENDECTOMY    . CHOLECYSTECTOMY    . COLONOSCOPY W/ ENDOSCOPIC [place]  09/27/06   02/2011   . CYSTOSCOPY N/A 07/03/2013   Procedure: CYSTOSCOPY;  Surgeon: Lyman Speller, MD;  Location: [facility];  Service: Gynecology;  Laterality: N/A;  . ESOPHAGOGASTRODUODENOSCOPY    . ROBOTIC ASSISTED TOTAL HYSTERECTOMY WITH BILATERAL SALPINGO OOPHERECTOMY Bilateral  07/03/2013   Procedure: ROBOTIC ASSISTED TOTAL HYSTERECTOMY WITH BILATERAL SALPINGO OOPHORECTOMY;  Surgeon: Lyman Speller, MD;  Location: [facility];  Service: Gynecology;  Laterality: Bilateral;  . TONSILLECTOMY  1973    MEDS:   Current Outpatient Medications on File Prior to Visit  Medication Sig Dispense Refill  . Alirocumab (PRALUENT) 75 MG/ML SOPN Inject 75 mg into the skin every 14 (fourteen) days. 2 pen 12  . amphetamine-dextroamphetamine (ADDERALL XR) 30 MG 24 hr capsule Take 1 capsule (30 mg total) by mouth every morning. 30 capsule 0  . betamethasone dipropionate (DIPROLENE) 0.05 % cream Apply topically 2 (two) times daily. 60 g 5  . chlorzoxazone (PARAFON FORTE DSC) 500 MG tablet Take 1 tablet (500 mg total) by mouth 3 (three) times daily as needed for muscle spasms. 45 tablet 4  . diazepam (VALIUM) 5 MG tablet TAKE 1 TABLET AT BEDTIME AS NEEDED. 30 tablet 5  . diclofenac sodium (VOLTAREN) 1 % GEL Apply 4 g topically 4 (four) times daily. 1 Tube 10  . docusate sodium (COLACE) 100 MG capsule Take 100 mg by mouth as needed.     Marland Kitchen estradiol (MINIVELLE) 0.1 MG/24HR patch Place 1 patch (0.1 mg total) onto the skin 2 (two) times a week. 8 patch 13  . FLUoxetine (PROZAC) 10 MG tablet TAKE (1) TABLET BY MOUTH ONCE DAILY. 30 tablet 5  . HYDROcodone-acetaminophen (NORCO/VICODIN) 5-325 MG tablet 1 q4 hours prn,max 5 per day 150 tablet 0  . hydrOXYzine (ATARAX/VISTARIL) 25 MG tablet Take 1 tablet (25 mg total) by mouth Nightly. (  Patient taking differently: Take 25 mg by mouth as needed. ) 30 tablet 0  . lidocaine (XYLOCAINE JELLY) 2 % jelly Apply 1 application topically as needed. 30 mL 3  . lidocaine (XYLOCAINE) 5 % ointment Apply 1 application topically as needed. 35.44 g 5  . loratadine (CLARITIN) 10 MG tablet Take 10 mg by mouth daily.    . meloxicam (MOBIC) 15 MG tablet TAKE 1 TABLET ONCE DAILY. DO NOT USE ON THE SAME DAY AS NAPROXEN. 30 tablet 3  . metFORMIN (GLUCOPHAGE-XR) 500 MG 24 hr  tablet TAKE (1) TABLET BY MOUTH EACH MORNING. 30 tablet 5  . omeprazole (PRILOSEC) 40 MG capsule TAKE ONE CAPSULE BY MOUTH ONCE DAILY. 30 capsule 5  . propranolol (INDERAL) 10 MG tablet TAKE 1TABLETS TWICE A DAY AS NEEDED. 60 tablet 5  . rosuvastatin (CRESTOR) 5 MG tablet Take 1 tablet (5 mg total) by mouth daily. 30 tablet 5  . triamcinolone (NASACORT) 55 MCG/ACT AERO nasal inhaler USE 1 SPRAY IN EACH NOSTRIL DAILY. 1 Inhaler 5  . triamcinolone cream (KENALOG) 0.1 % Apply to affected area twice daily as needed 45 g 4   No current facility-administered medications on file prior to visit.     ALLERGIES: Triptans; Pravastatin; Gabapentin; and Topamax [topiramate]  Family History  Problem Relation Age of Onset  . COPD Mother        Deceased, 44  . Prostate cancer Father        Deceased, 79  . Emphysema Sister   . Healthy Brother   . Liver cancer Other        and pancreatic cancer-paternal cousin  . Liver cancer Other        paternal cousin  . Breast cancer Cousin     SH:  In committed relationship, non smoker  Review of Systems  All other systems reviewed and are negative.   PHYSICAL EXAMINATION:    BP 126/80 (BP Location: Right Arm, Patient Position: Sitting, Cuff Size: Large)   Pulse 88   Resp 16   Ht 5\' 3"  (1.6 m)   Wt 177 lb 9.6 oz (80.6 kg)   LMP 05/31/2013   BMI 31.46 kg/m     General appearance: alert, cooperative and appears stated age Lymph: no inguinal LAD noted  Pelvic: External genitalia:  no lesions              Urethra:  normal appearing urethra with no masses, tenderness or lesions              Bartholins and Skenes: normal                 Vagina: normal appearing vagina, watery discharge present, no lesions or odor              Cervix: absent              Bimanual Exam:  Uterus:  uterus absent              Adnexa: no mass, fullness, tenderness  Chaperone was present for exam.  Assessment: Vaginal discharge/irritation  Plan: Affirm  pending Tinidazole 1gm po x 5 days to pharmacy.  If results warrant additional treatment, pt will be notified

## 2018-04-30 LAB — VAGINITIS/VAGINOSIS, DNA PROBE
Candida Species: POSITIVE — AB
GARDNERELLA VAGINALIS: NEGATIVE
Trichomonas vaginosis: NEGATIVE

## 2018-04-30 MED ORDER — FLUCONAZOLE 150 MG PO TABS: 150.0000 mg | ORAL_TABLET | Freq: Once | ORAL | 0 refills | Status: AC

## 2018-05-09 ENCOUNTER — Encounter: Payer: Self-pay | Admitting: Family Medicine

## 2018-05-09 ENCOUNTER — Other Ambulatory Visit: Payer: Self-pay | Admitting: Family Medicine

## 2018-05-09 NOTE — Telephone Encounter (Signed)
Discussed with pt. Pt does want dr Nicki Reaper to try to get approved and if not able to will discuss at next office visit about referral to cholestrol clinic through cardiology.   Called insurance to get form faxed over to fill out and they state their is no form. To get approved after denial will need to fax over the letter of denial along with a letter as to why the pt needs the medication and any labs that may go with reason pt needs med. Letter of denial sent to dr scott's folder.   Fax to [phone]

## 2018-05-09 NOTE — Telephone Encounter (Signed)
A letter was dictated regarding this-please submit-please submit Also please give the patient notification this was completed It is at the [facility]

## 2018-05-10 NOTE — Telephone Encounter (Signed)
Letter along with letter of denial and last labs faxed. Pt notified.

## 2018-05-14 ENCOUNTER — Other Ambulatory Visit: Payer: Self-pay | Admitting: Family Medicine

## 2018-05-23 ENCOUNTER — Telehealth: Payer: Self-pay

## 2018-05-23 NOTE — Telephone Encounter (Signed)
Per [employer] Praluent inj 75 mg/ml appeal was denied.Denial in your box in office.

## 2018-05-23 NOTE — Telephone Encounter (Signed)
There is nothing further we can do to try to help get this medication If the patient is interested in being seen at a dyslipidemia clinic for further evaluation we can help make a referral regarding that Please see what the patient would like to do

## 2018-05-24 NOTE — Addendum Note (Signed)
Addended by: Dairl Ponder on: 05/24/2018 08:55 AM   Modules accepted: Orders

## 2018-05-24 NOTE — Telephone Encounter (Signed)
Left message to return call 

## 2018-05-25 NOTE — Telephone Encounter (Signed)
I spoke with the patient is aware that we have sent in an appeal and it was denied.She states she will discuss with you at next office visit for a game plan.

## 2018-06-11 ENCOUNTER — Other Ambulatory Visit: Payer: Self-pay | Admitting: Family Medicine

## 2018-06-14 NOTE — Telephone Encounter (Signed)
She may have this with 2 refills-also please let patient know that taking any anti-inflammatories with Prozac and similar medications can increase her risk of GI bleed-they can continue the medication but they need to be aware of this so if they ever see signs of GI bleed they get seen right away

## 2018-07-11 ENCOUNTER — Other Ambulatory Visit: Payer: Self-pay | Admitting: Family Medicine

## 2018-07-14 ENCOUNTER — Ambulatory Visit: Payer: Managed Care, Other (non HMO) | Admitting: Family Medicine

## 2018-07-14 ENCOUNTER — Telehealth: Payer: Self-pay | Admitting: *Deleted

## 2018-07-14 ENCOUNTER — Encounter: Payer: Self-pay | Admitting: Family Medicine

## 2018-07-14 VITALS — BP 132/82 | Ht 63.0 in | Wt 182.8 lb

## 2018-07-14 DIAGNOSIS — K148 Other diseases of tongue: Secondary | ICD-10-CM

## 2018-07-14 DIAGNOSIS — G5601 Carpal tunnel syndrome, right upper limb: Secondary | ICD-10-CM

## 2018-07-14 DIAGNOSIS — E1140 Type 2 diabetes mellitus with diabetic neuropathy, unspecified: Secondary | ICD-10-CM | POA: Diagnosis not present

## 2018-07-14 DIAGNOSIS — J019 Acute sinusitis, unspecified: Secondary | ICD-10-CM

## 2018-07-14 DIAGNOSIS — Z79891 Long term (current) use of opiate analgesic: Secondary | ICD-10-CM

## 2018-07-14 DIAGNOSIS — E119 Type 2 diabetes mellitus without complications: Secondary | ICD-10-CM | POA: Diagnosis not present

## 2018-07-14 DIAGNOSIS — G5691 Unspecified mononeuropathy of right upper limb: Secondary | ICD-10-CM

## 2018-07-14 DIAGNOSIS — K1370 Unspecified lesions of oral mucosa: Secondary | ICD-10-CM

## 2018-07-14 MED ORDER — DICLOFENAC SODIUM 1 % TD GEL: 4.0000 g | Freq: Four times a day (QID) | TRANSDERMAL | 10 refills | Status: AC

## 2018-07-14 MED ORDER — FLUCONAZOLE 150 MG PO TABS: 150.0000 mg | ORAL_TABLET | Freq: Once | ORAL | 4 refills | Status: AC

## 2018-07-14 MED ORDER — MELOXICAM 15 MG PO TABS: ORAL_TABLET | ORAL | 12 refills | Status: DC

## 2018-07-14 MED ORDER — PROPRANOLOL HCL 10 MG PO TABS: ORAL_TABLET | ORAL | 5 refills | Status: DC

## 2018-07-14 MED ORDER — AMOXICILLIN 500 MG PO TABS: 500.0000 mg | ORAL_TABLET | Freq: Three times a day (TID) | ORAL | 0 refills | Status: DC

## 2018-07-14 MED ORDER — FLUOXETINE HCL 10 MG PO TABS: ORAL_TABLET | ORAL | 10 refills | Status: DC

## 2018-07-14 MED ORDER — LIDOCAINE HCL 2 % EX GEL: 1.0000 "application " | CUTANEOUS | 12 refills | Status: DC | PRN

## 2018-07-14 NOTE — Telephone Encounter (Signed)
Pt was seen today. On her way out she was looking for brendale to let her know she wanted to see hand specialist that would also do EMG if possible. States she spoke with dr Nicki Reaper about this at office visit. No referral put in yet. Did you want to refer her?

## 2018-07-14 NOTE — Telephone Encounter (Signed)
The patient needs to referrals #1 referral to hand orthopedic specialist with right hand patient will also need nerve conduction EMG preferably with the same practice if that is not possible she will need referral to neurology for this #2 she needs referral to ENT for a oral lesion Diagnosis hand neuralgia or neuropathy Diagnosis oral lesion She prefers [place] only

## 2018-07-14 NOTE — Progress Notes (Signed)
Subjective:    Patient ID: Lisa Li, female    DOB: [birth year], 52 y.o.   MRN: [id number]  HPI This patient was seen today for chronic pain  The medication list was reviewed and updated.   -Compliance with medication: yes  - Number patient states they take daily: 3-4  -when was the last dose patient took? This morning  The patient was advised the importance of maintaining medication and not using illegal substances with these.  Here for refills and follow up  The patient was educated that we can provide 3 monthly scripts for their medication, it is their responsibility to follow the instructions.  Side effects or complications from medications: none  Patient is aware that pain medications are meant to minimize the severity of the pain to allow their pain levels to improve to allow for better function. They are aware of that pain medications cannot totally remove their pain.  Due for UDT ( at least once per year) : done today PT would like refills on Diclofenac and Lidocaine gel  Patient relates a fair amount of head congestion drainage coughing sinus congestion not feeling good sinus pressure popping in the right ear denies high fever chills sweats denies wheezing difficulty breathing  Patient will be doing her lab work in the near future.  She states she is taking her diabetes medicine on a regular basis denies any low sugar spells.   Patient does take her acid blocker omeprazole 40 mg every day denies any stomach pain denies any rectal bleeding hematochezia or hematemesis.  Patient does have ongoing arthralgias and also has pain and discomfort in her wrists she takes meloxicam but does not do enough she does use Voltaren gel both on her wrist as well as her knees for osteoarthritic changes pain   Patient also has numbness that goes down her right arm plus also have sent her hands she has had a nerve conduction study in 2015 Which Showed Carpal Tunl. but she also states  there is an area in her shoulder that causes her numbness to go away when she pushes on it she does wear a brace on her wrist to help some  Patient also with adult ADD takes her medicine regular basis it does help her focus she denies abusing the medicine she does need refills Review of Systems  Constitutional: Negative for activity change, appetite change and fever.  HENT: Positive for rhinorrhea, sinus pressure and sinus pain. Negative for congestion and ear pain.   Eyes: Negative for discharge.  Respiratory: Negative for cough, shortness of breath and wheezing.   Cardiovascular: Negative for chest pain and leg swelling.  Gastrointestinal: Negative for abdominal pain, nausea and vomiting.  Skin: Negative for color change.  Neurological: Negative for dizziness and weakness.  Psychiatric/Behavioral: Negative for agitation and confusion.       Objective:   Physical Exam  Constitutional: She appears well-nourished. No distress.  HENT:  Head: Normocephalic and atraumatic.  Eyes: Right eye exhibits no discharge. Left eye exhibits no discharge.  Neck: No tracheal deviation present.  Cardiovascular: Normal rate, regular rhythm and normal heart sounds.  No murmur heard. Pulmonary/Chest: Effort normal and breath sounds normal. No respiratory distress.  Musculoskeletal: She exhibits no edema.  Lymphadenopathy:    She has no cervical adenopathy.  Neurological: She is alert. Coordination normal.  Skin: Skin is warm and dry.  Psychiatric: She has a normal mood and affect. Her behavior is normal.  Vitals reviewed.  Positive Tinel's  no sign of any weakness in the hand She also has a white tongue lesion on the side of her tongue on the right side she states it stays there does cause intermittent pain      Assessment & Plan:  Tongue lesion-referral to ENT may need biopsy because it has not gone away on its own and present for weeks on end  Probable carpal tunnel but cannot rule out the  possibility of nerve impingement-referral to specialist hopefully they can also do nerve conduction study and EMG within their office if that is not possible she will need referral to neurology for nerve conduction EMG with follow-up with hand specialist  The patient was seen in followup for chronic pain. A review over at their current pain status was discussed. Drug registry was checked. Prescriptions were given. Discussion was held regarding the importance of compliance with medication as well as pain medication contract.  Time for questions regarding pain management plan occurred. Importance of regular followup visits was discussed. Patient was informed that medication may cause drowsiness and should not be combined  with other medications/alcohol or street drugs. Patient was cautioned that medication could cause drowsiness. If the patient feels medication is causing altered alertness then do not drive or operate dangerous equipment.  Drug registry checked 3 prescriptions given  Adult ADD 3 prescriptions given drug registry was checked continue medication it does benefit her  25 minutes was spent with the patient.  This statement verifies that 25 minutes was indeed spent with the patient.  More than 50% of this visit-total duration of the visit-was spent in counseling and coordination of care. The issues that the patient came in for today as reflected in the diagnosis (s) please refer to documentation for further details.

## 2018-07-14 NOTE — Telephone Encounter (Signed)
Referrals put in   

## 2018-07-17 MED ORDER — HYDROCODONE-ACETAMINOPHEN 5-325 MG PO TABS: ORAL_TABLET | ORAL | 0 refills | Status: DC

## 2018-07-17 MED ORDER — AMPHETAMINE-DEXTROAMPHET ER 30 MG PO CP24: ORAL_CAPSULE | ORAL | 0 refills | Status: DC

## 2018-07-17 MED ORDER — AMPHETAMINE-DEXTROAMPHET ER 30 MG PO CP24: 30.0000 mg | ORAL_CAPSULE | ORAL | 0 refills | Status: DC

## 2018-07-22 LAB — TOXASSURE SELECT 13 (MW), URINE

## 2018-08-16 IMAGING — CR DG HIP (WITH OR WITHOUT PELVIS) 2-3V*R*
2 series · 2 of 2 positions shown · non-contrast
Comparison: None.

CLINICAL DATA: Chronic right hip pain.

EXAM:
DG HIP (WITH OR WITHOUT PELVIS) 2-3V RIGHT

[t hip ap right *]
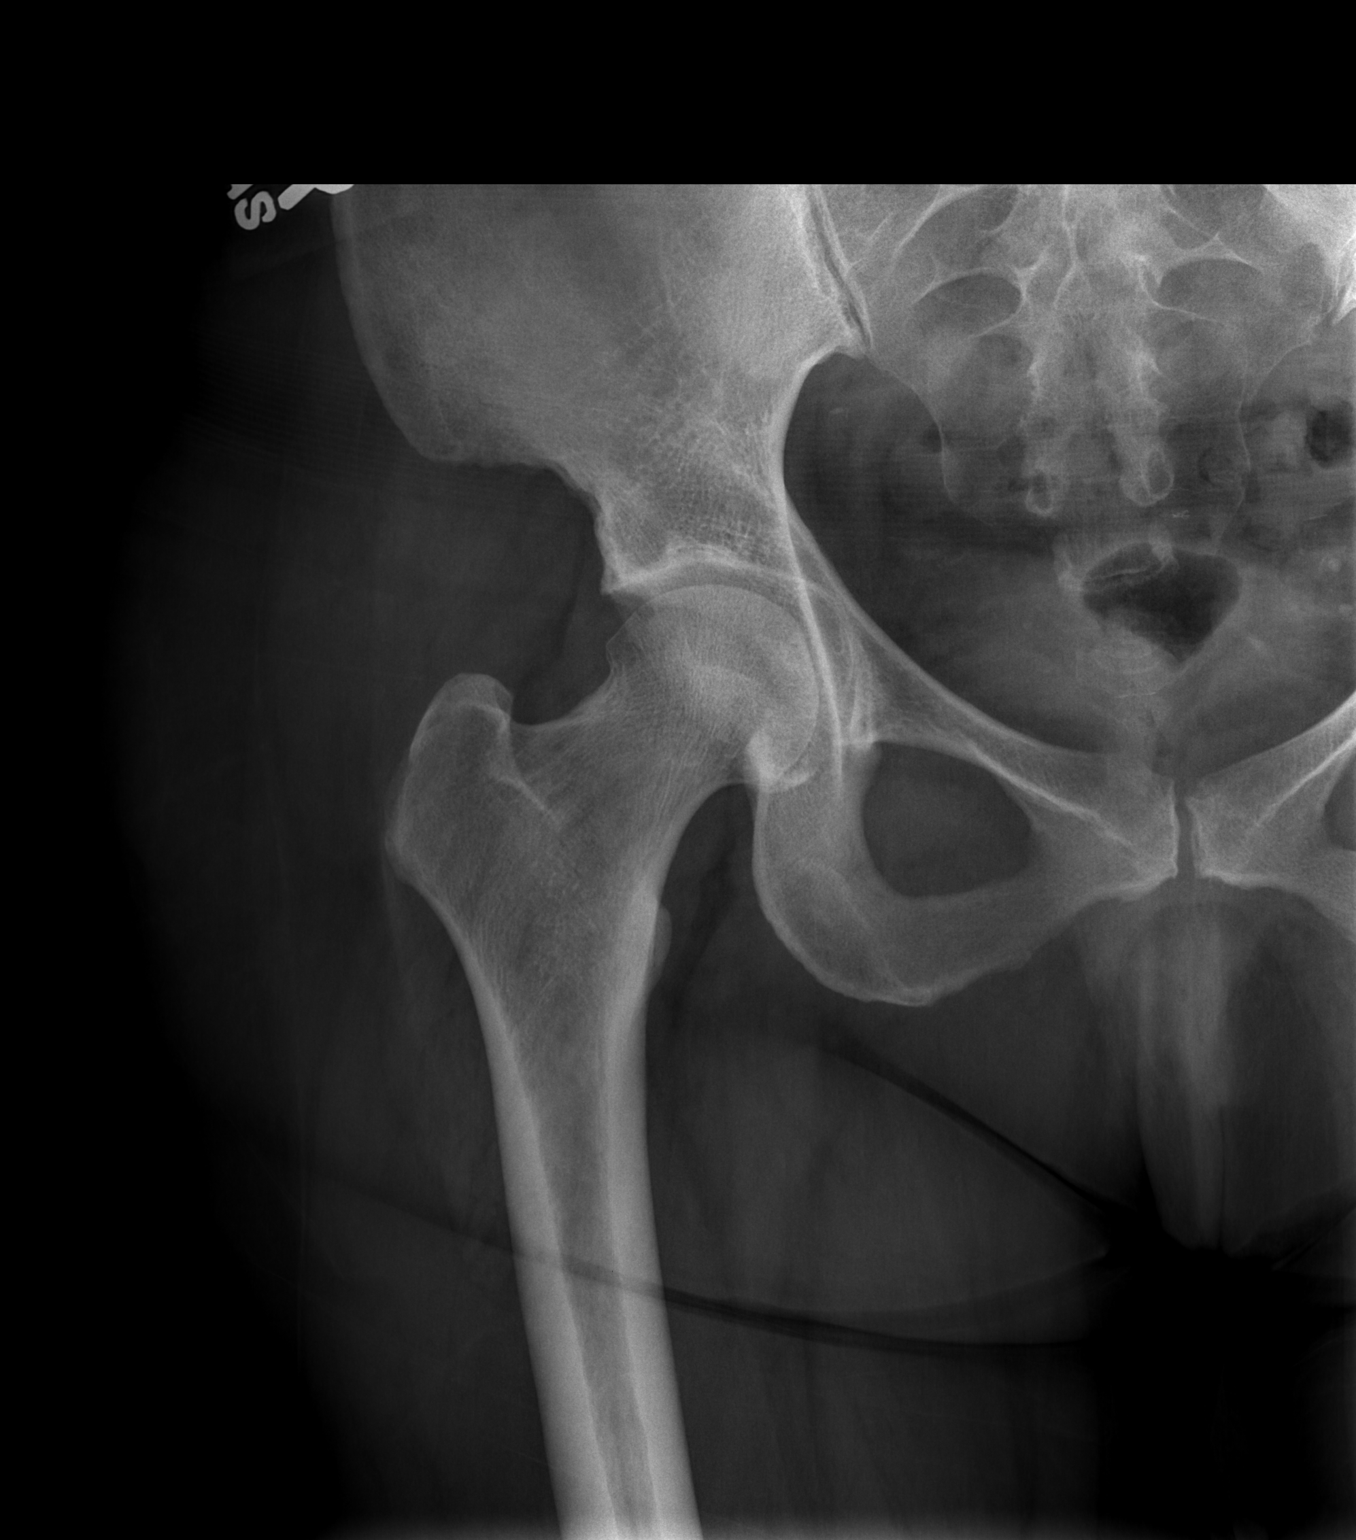

[t hip frog leg right *]
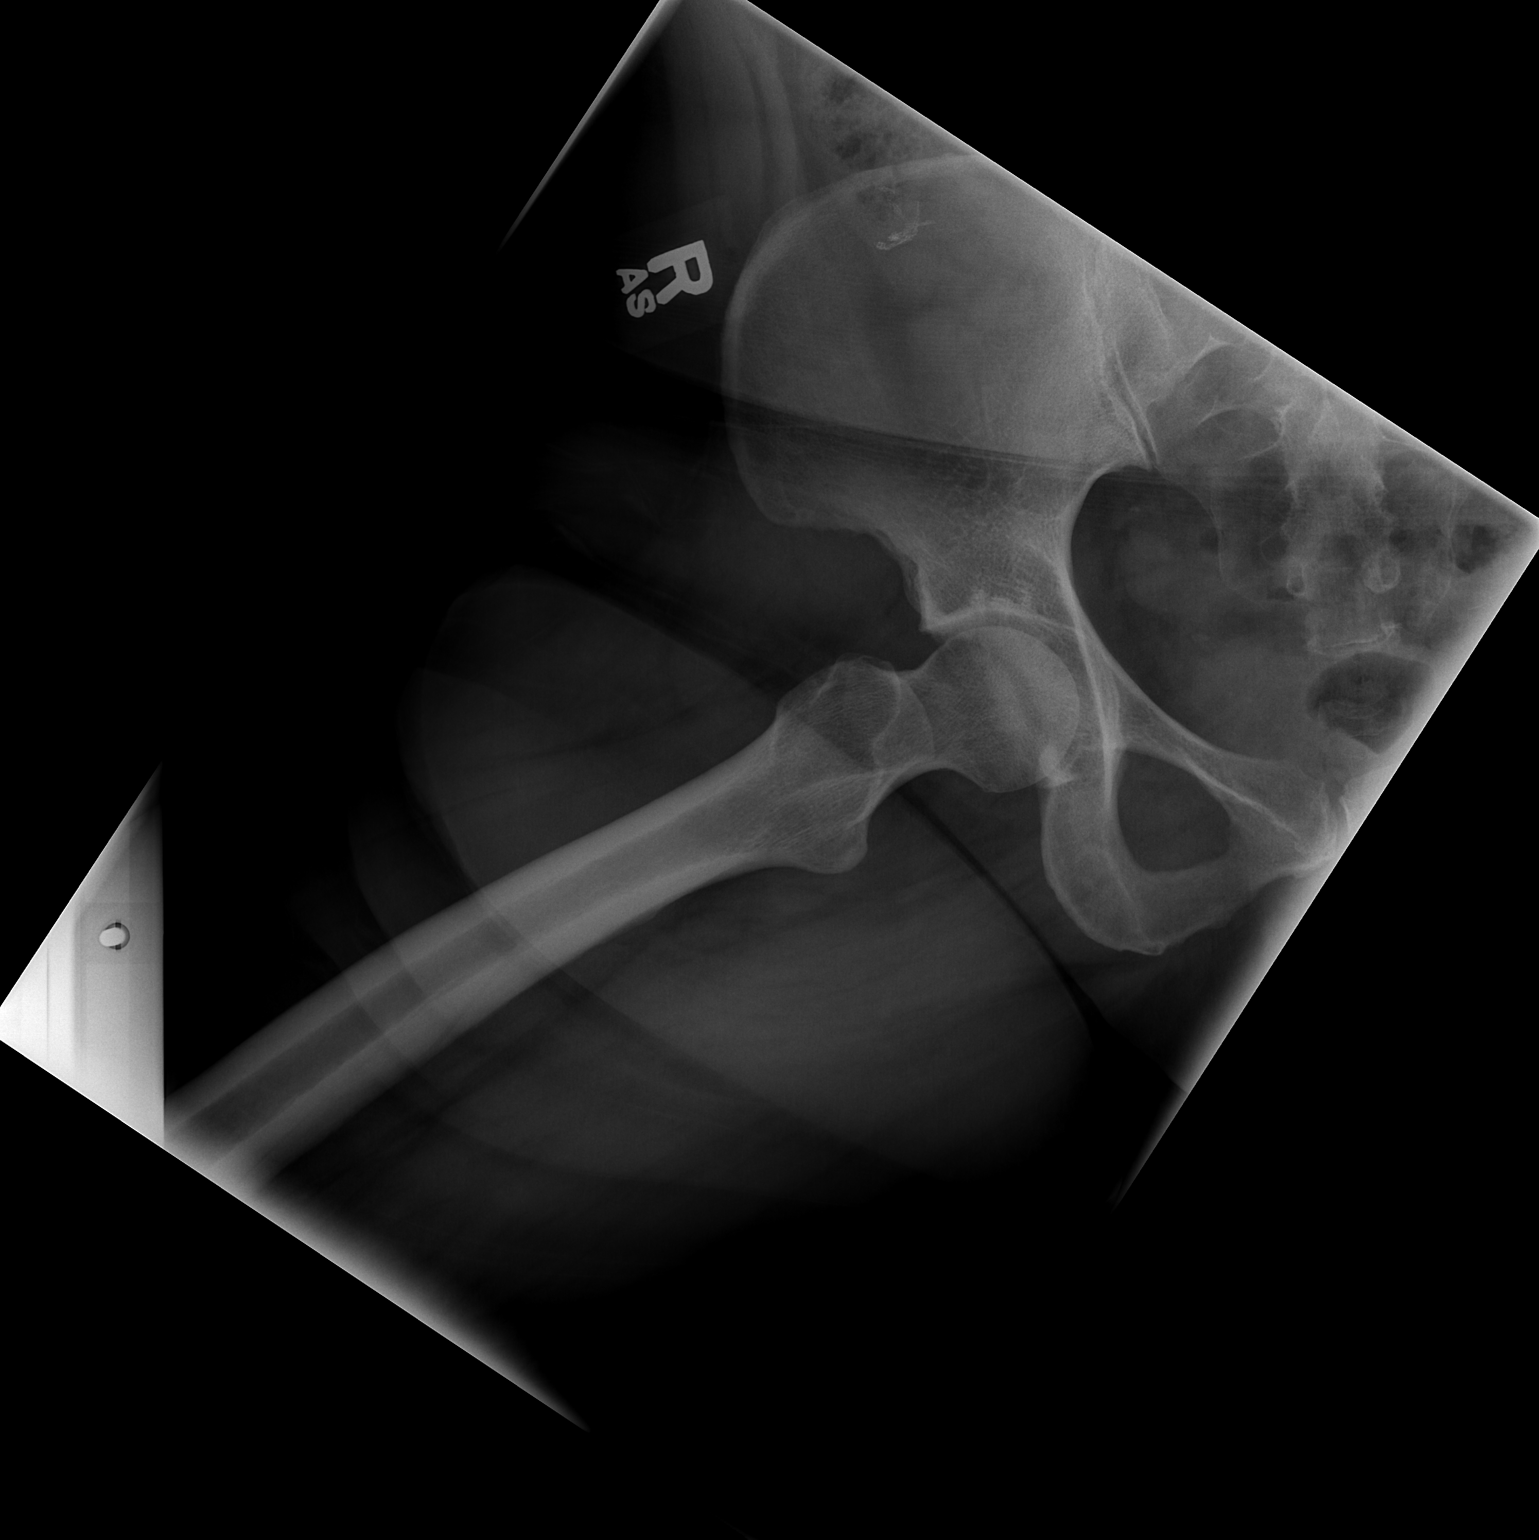

[2 of 2 positions shown; findings below may reference images not displayed]

FINDINGS: There is no evidence of hip fracture or dislocation. There is no
evidence of arthropathy or other focal bone abnormality.
IMPRESSION: Negative.

## 2018-08-16 IMAGING — CR DG SHOULDER 2+V*R*
3 series · 3 of 3 positions shown · non-contrast
Comparison: None.

CLINICAL DATA: Chronic right shoulder pain with decreased range of
motion.

EXAM:
RIGHT SHOULDER - 2+ VIEW

[w shoulder ap internal righ]
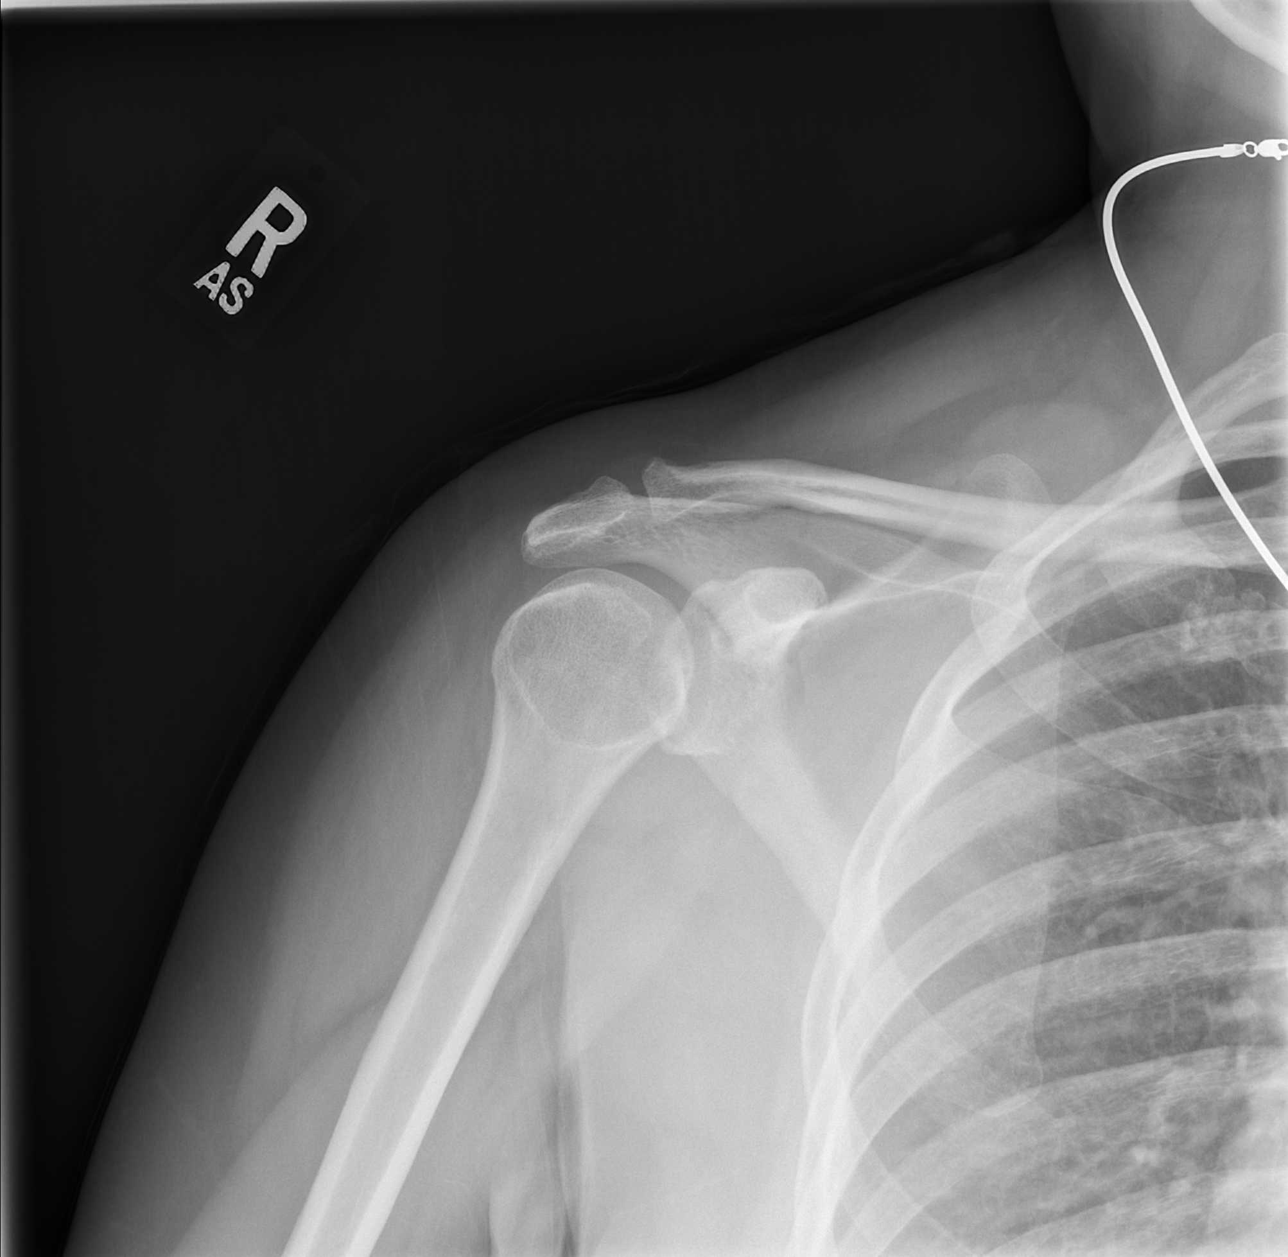

[w shoulder y view right *]
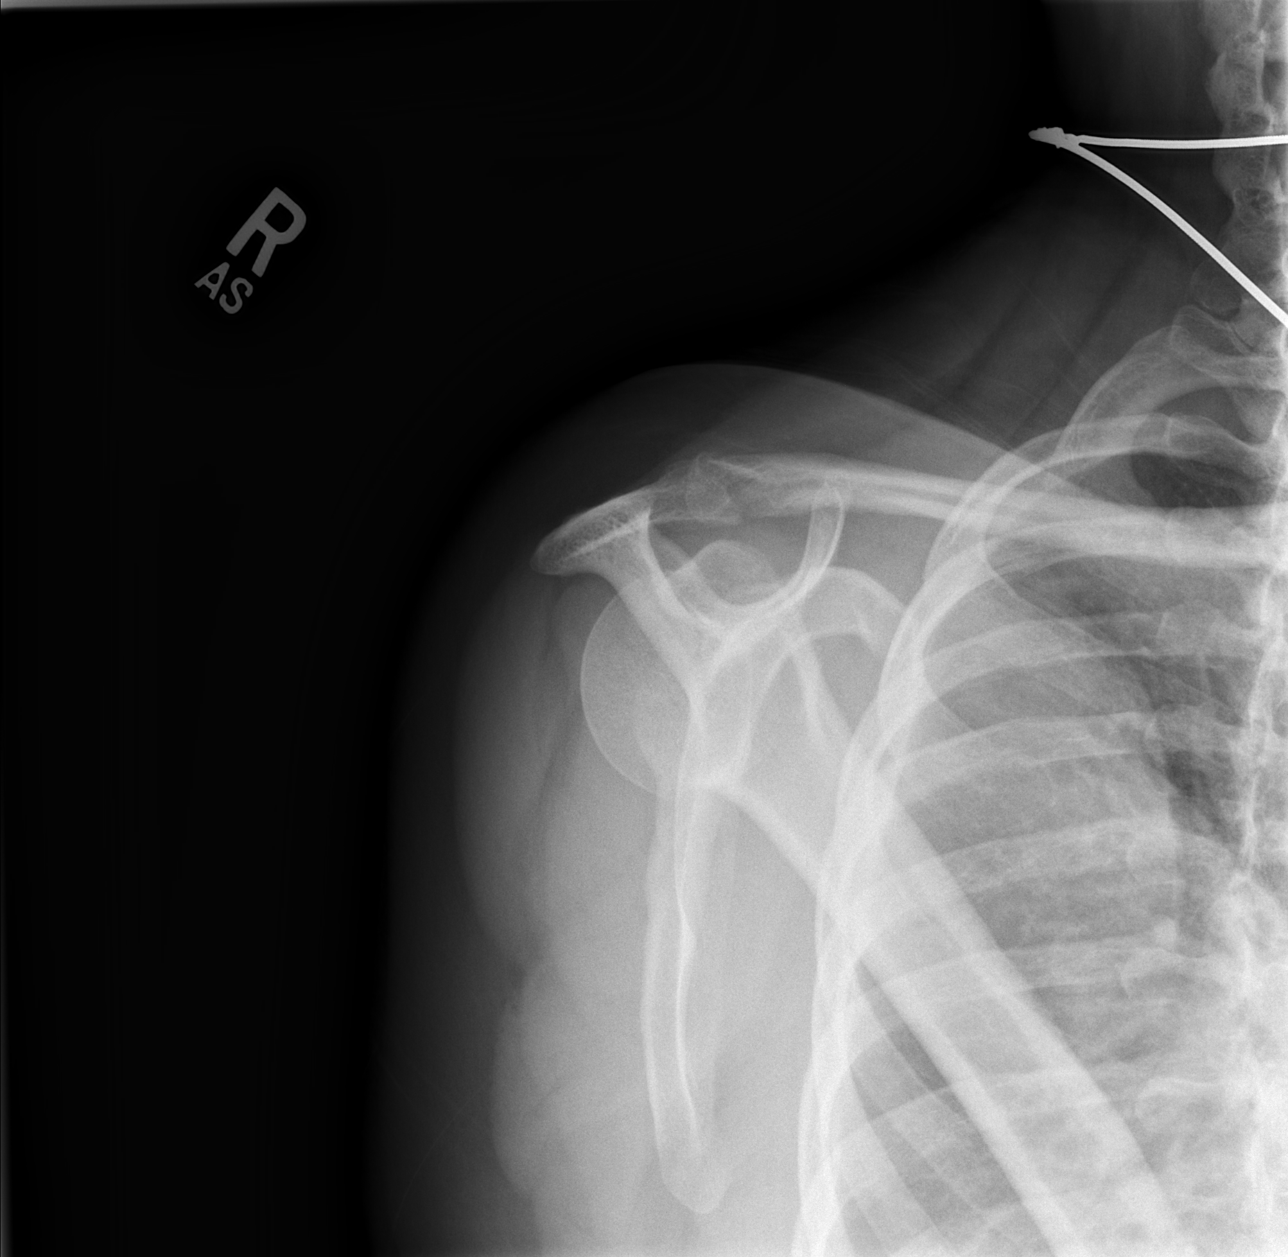

[w shoulder axillary right *]
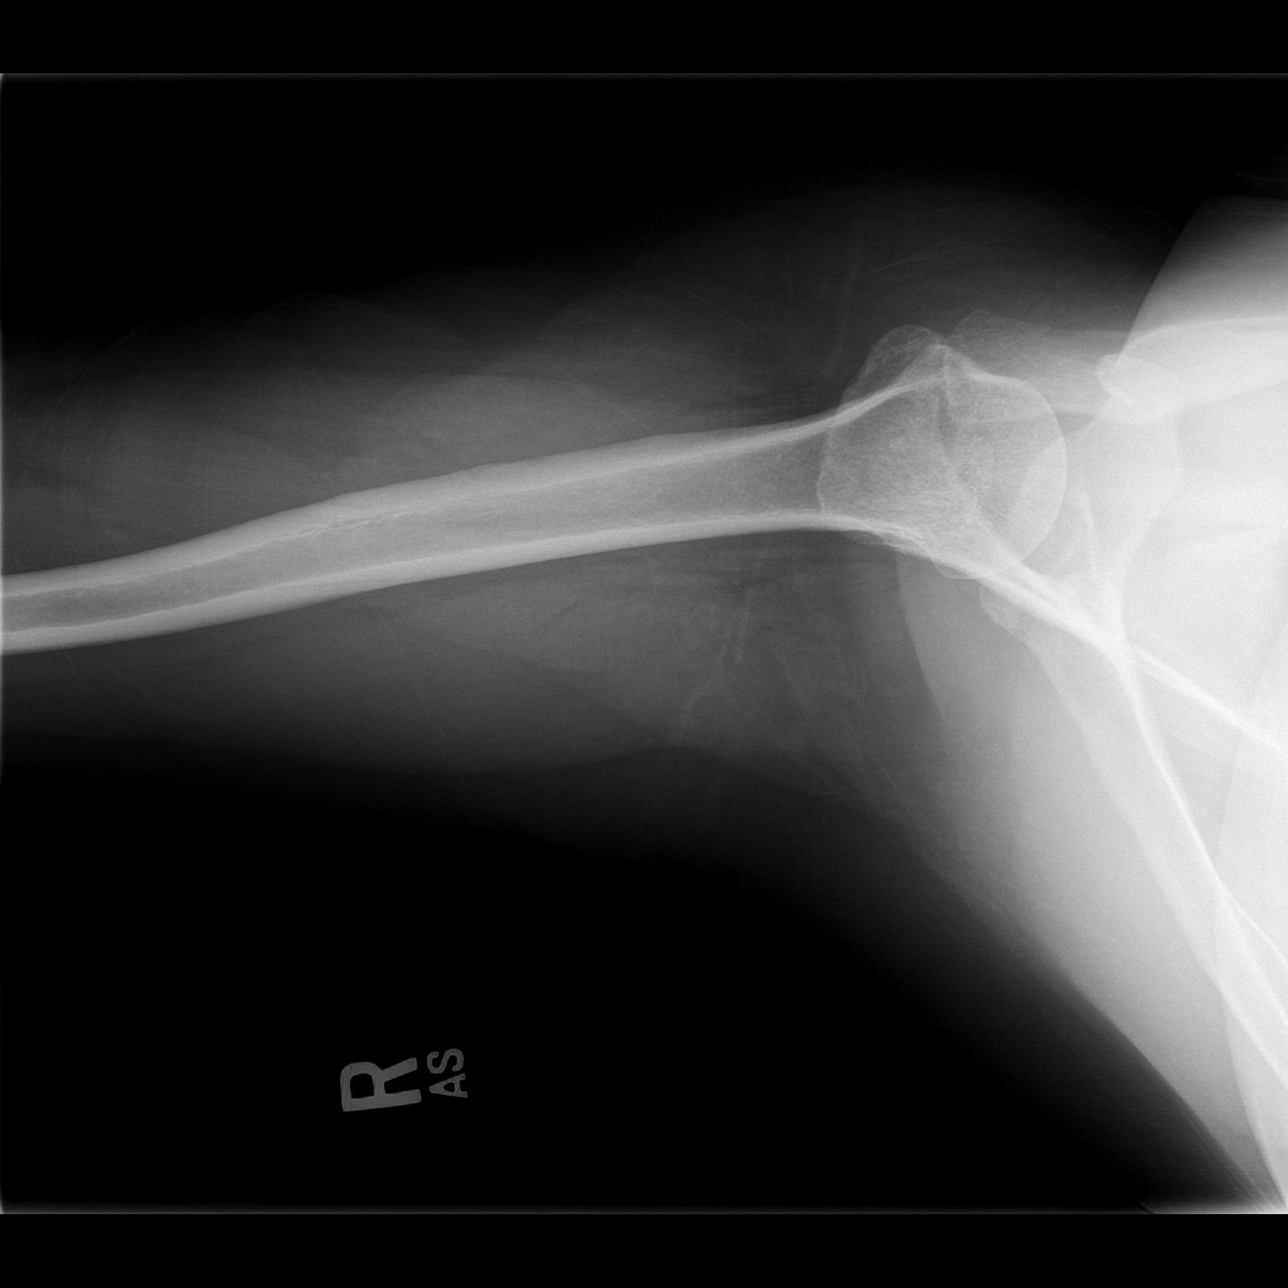

[3 of 3 positions shown; findings below may reference images not displayed]

FINDINGS: There is no evidence of fracture or dislocation. There is no
evidence of arthropathy or other focal bone abnormality. Soft
tissues are unremarkable.
IMPRESSION: Negative.

## 2018-08-16 IMAGING — CR DG LUMBAR SPINE COMPLETE 4+V
5 series · 5 of 5 positions shown · non-contrast
Comparison: CT on 05/28/2016

CLINICAL DATA: Chronic low back pain radiating to right hip and
leg.

EXAM:
LUMBAR SPINE - COMPLETE 4+ VIEW

[t l-spine a.p.]
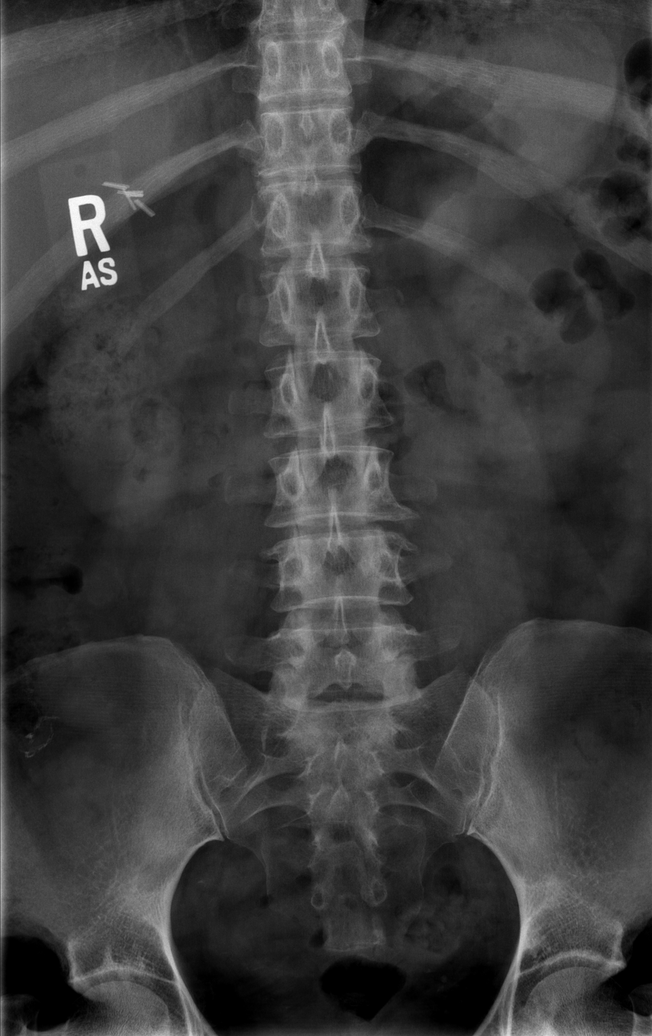

[t l-spine oblique exposure (1 of 2)]
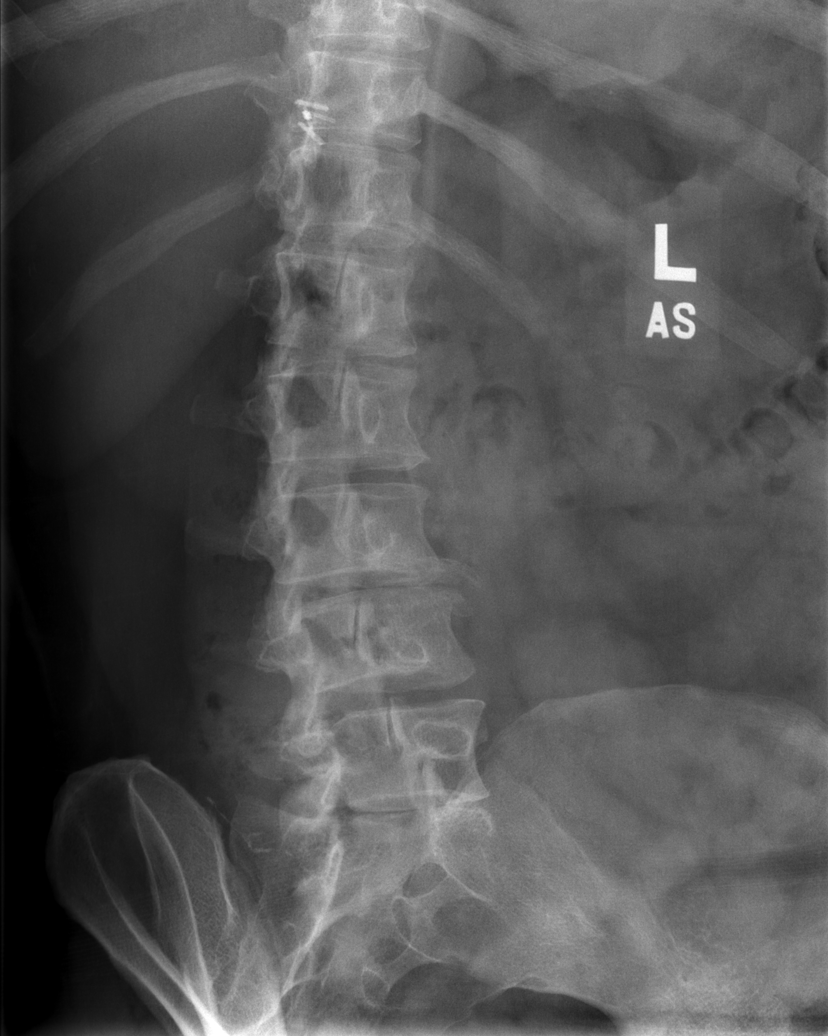

[t l-spine lat]
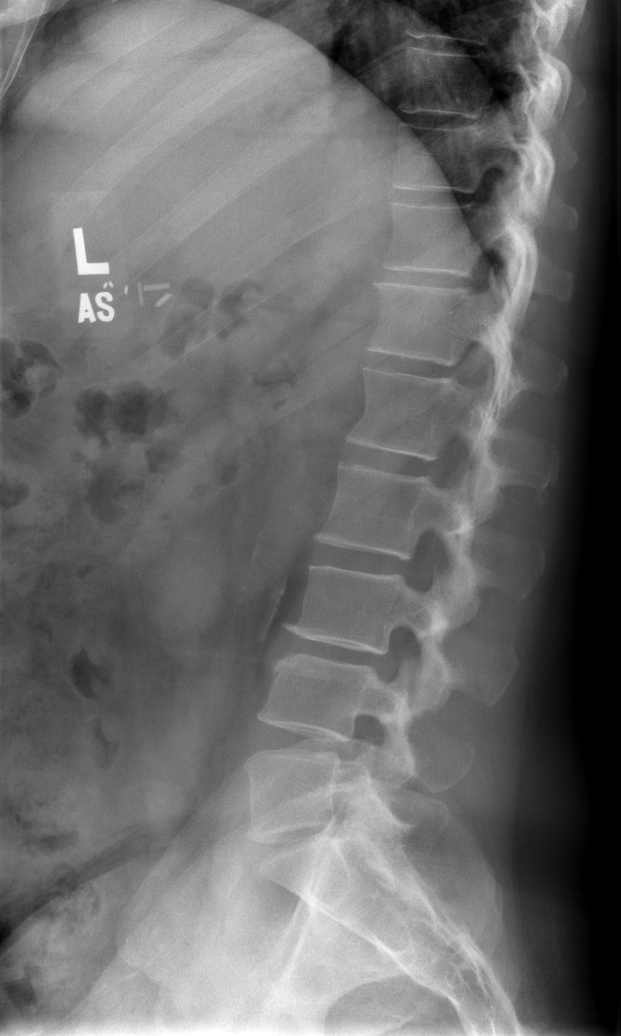

[t l-spine l5-s1 spot]
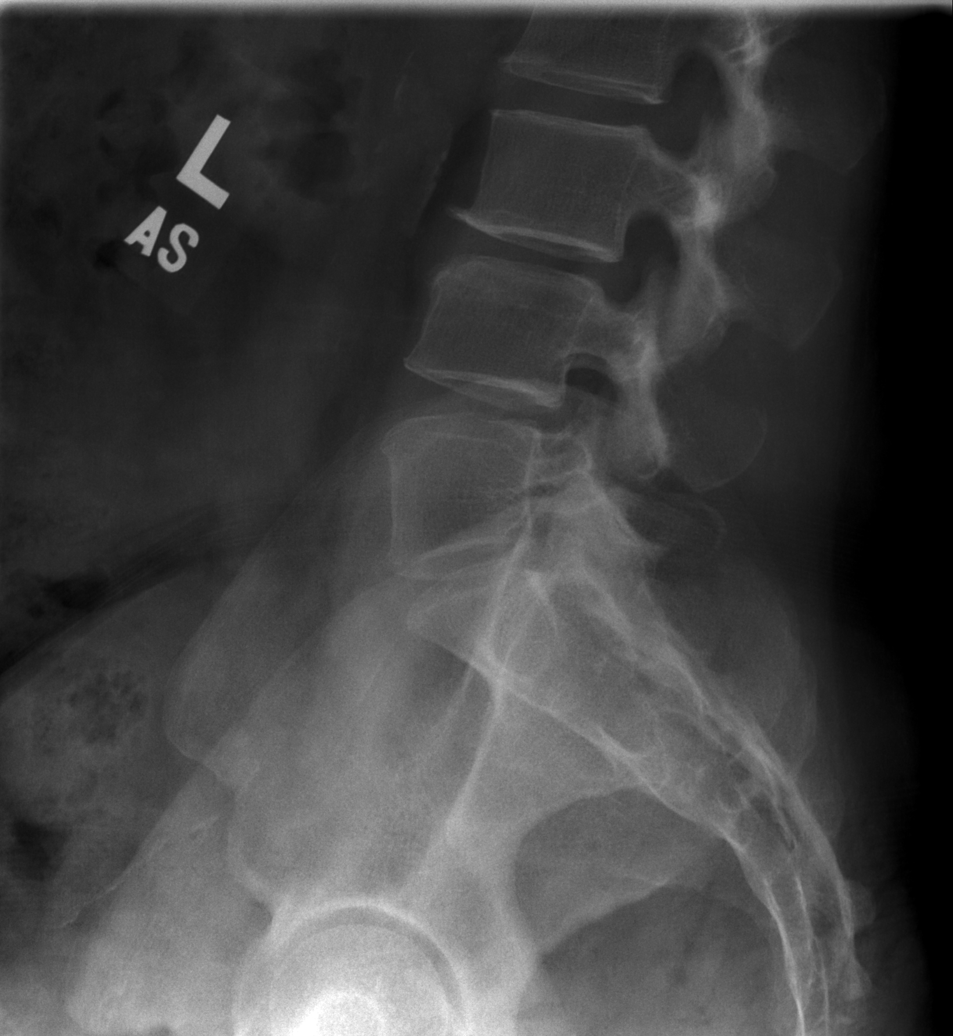

[t l-spine oblique exposure (2 of 2)]
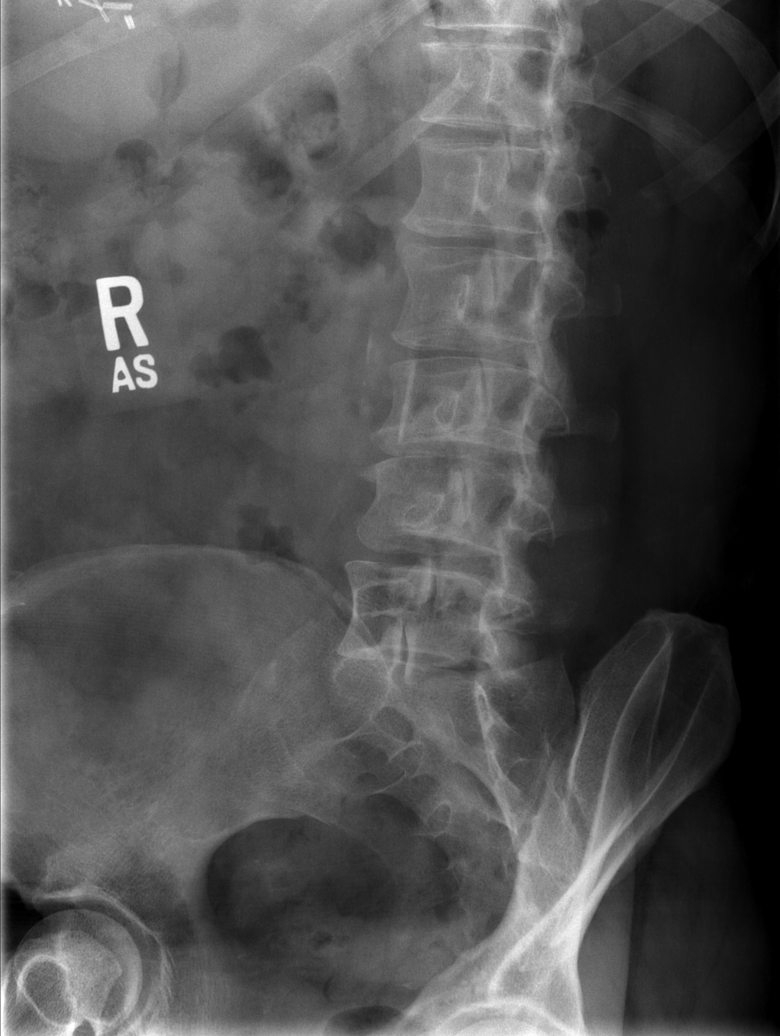

[5 of 5 positions shown; findings below may reference images not displayed]

FINDINGS: There is no evidence of lumbar spine fracture. Alignment is normal.
Mild degenerative disc disease is again seen at L3-4, without
significant change. Other intervertebral disc spaces are maintained.
Mild cervical kyphosis is again seen centered at this level. No
significant facet arthropathy or other bone lesions identified.
Aortic atherosclerosis.
IMPRESSION: No acute findings.

Stable mild L3-4 degenerative disc disease with associated mild
kyphosis.

## 2018-08-25 ENCOUNTER — Encounter: Payer: Self-pay | Admitting: Family Medicine

## 2018-08-25 DIAGNOSIS — E1140 Type 2 diabetes mellitus with diabetic neuropathy, unspecified: Secondary | ICD-10-CM

## 2018-08-26 ENCOUNTER — Telehealth: Payer: Self-pay | Admitting: Family Medicine

## 2018-08-26 NOTE — Telephone Encounter (Signed)
Addressed in My chart.

## 2018-08-26 NOTE — Telephone Encounter (Signed)
See my chart message

## 2018-08-26 NOTE — Telephone Encounter (Signed)
Nurses Please have Lisa Li work with this issue ( or y'all do so)  Currently pt has NCV test scheduled on [facility] What we need is: First a true consultation with the neurologist As in schedule consult with neurology then THEY will decide what test to do  So therefore her NCV can be cancelled as long as we schedule ov with neurology She wants to use the same [facility]  Please notify pt of changes and you were consulted aboyut this with me  Thanks

## 2018-08-26 NOTE — Telephone Encounter (Addendum)
I called and left a message on the vm for the pt to r/c. I placed an urgent referral/order in epic.We need to find out the number to the neurology office so we can cancel the nerve conduction if this is what the pt wants.

## 2018-08-26 NOTE — Addendum Note (Signed)
Addended by: Karle Barr on: 08/26/2018 09:41 AM   Modules accepted: Orders

## 2018-08-26 NOTE — Telephone Encounter (Signed)
Pt returned call. Pt advised that the nerve conduction test has been cancelled for Wednesday. Pt advised that we are going to get her set up with neurologist for face to face so they can order correct test. Pt verbalized understanding.

## 2018-10-11 LAB — LIPID PANEL
CHOLESTEROL TOTAL: 207 mg/dL — AB (ref 100–199)
Chol/HDL Ratio: 5.4 ratio — ABNORMAL HIGH (ref 0.0–4.4)
HDL: 38 mg/dL — AB (ref >39)
LDL CALC: 111 mg/dL — AB (ref 0–99)
Triglycerides: 290 mg/dL — ABNORMAL HIGH (ref 0–149)
VLDL Cholesterol Cal: 58 mg/dL — ABNORMAL HIGH (ref 5–40)

## 2018-10-11 LAB — MICROALBUMIN / CREATININE URINE RATIO
CREATININE, UR: 125.9 mg/dL
Microalb/Creat Ratio: 8.7 mg/g creat (ref 0.0–30.0)
Microalbumin, Urine: 10.9 ug/mL

## 2018-10-11 LAB — HEMOGLOBIN A1C
Est. average glucose Bld gHb Est-mCnc: 151 mg/dL
HEMOGLOBIN A1C: 6.9 % — AB (ref 4.8–5.6)

## 2018-10-11 LAB — HEPATIC FUNCTION PANEL
ALK PHOS: 49 IU/L (ref 39–117)
ALT: 39 IU/L — ABNORMAL HIGH (ref 0–32)
AST: 36 IU/L (ref 0–40)
Albumin: 4.4 g/dL (ref 3.5–5.5)
Bilirubin Total: <0.2 mg/dL (ref 0.0–1.2)
Bilirubin, Direct: 0.07 mg/dL (ref 0.00–0.40)
Total Protein: 6.7 g/dL (ref 6.0–8.5)

## 2018-10-14 ENCOUNTER — Ambulatory Visit: Payer: Managed Care, Other (non HMO) | Admitting: Family Medicine

## 2018-10-14 ENCOUNTER — Encounter: Payer: Self-pay | Admitting: Family Medicine

## 2018-10-14 VITALS — BP 128/88 | Ht 63.0 in | Wt 184.0 lb

## 2018-10-14 DIAGNOSIS — R740 Nonspecific elevation of levels of transaminase and lactic acid dehydrogenase [LDH]: Secondary | ICD-10-CM | POA: Diagnosis not present

## 2018-10-14 DIAGNOSIS — R7401 Elevation of levels of liver transaminase levels: Secondary | ICD-10-CM

## 2018-10-14 DIAGNOSIS — E7849 Other hyperlipidemia: Secondary | ICD-10-CM | POA: Diagnosis not present

## 2018-10-14 DIAGNOSIS — E1140 Type 2 diabetes mellitus with diabetic neuropathy, unspecified: Secondary | ICD-10-CM | POA: Diagnosis not present

## 2018-10-14 DIAGNOSIS — F988 Other specified behavioral and emotional disorders with onset usually occurring in childhood and adolescence: Secondary | ICD-10-CM

## 2018-10-14 MED ORDER — MUPIROCIN 2 % EX OINT: TOPICAL_OINTMENT | CUTANEOUS | 4 refills | Status: AC

## 2018-10-14 NOTE — Patient Instructions (Signed)
Find out about calcium score and labs and let me know

## 2018-10-14 NOTE — Progress Notes (Signed)
Subjective:    Patient ID: Lisa Li, female    DOB: 07/13/1966, 53 y.o.   MRN: [id number]  HPI  Patient is here today to follow up on her chronic illnesses  She is diabetic and her last a1c was drawn 10/10/2018 at 6.9.She takes Metformin 500 mg once per day and also here today for Add and follow up and chronic pain.   Patient was seen today for ADD checkup.  This patient does have ADD.  Patient takes medications for this.  If this does help control overall symptoms.  Please see below. -weight, vital signs reviewed.  The following items were covered. -Compliance with medication : Yes  -Problems with completing homework, paying attention/taking good notes in school: No  -grades: NA  - Eating patterns : appetite decreased  -sleeping: good  -Additional issues or questions: No We did discuss her prediabetes importance of healthy diet regular physical activity and taking her medication. We also discussed fatty liver and the importance healthy diet watching diet losing weight and periodically monitoring lab work  Review of Systems  Constitutional: Negative for activity change, appetite change and fatigue.  HENT: Negative for congestion and rhinorrhea.   Respiratory: Negative for cough and shortness of breath.   Cardiovascular: Negative for chest pain and leg swelling.  Gastrointestinal: Negative for abdominal pain and diarrhea.  Endocrine: Negative for polydipsia and polyphagia.  Skin: Negative for color change.  Neurological: Negative for dizziness and weakness.  Psychiatric/Behavioral: Negative for behavioral problems and confusion.      This patient was seen today for chronic pain  The medication list was reviewed and updated.   -Compliance with medication: Yes  - Number patient states they take daily:3-4 per day  -when was the last dose patient took? This this am.  The patient was advised the importance of maintaining medication and not using illegal substances  with these.  Here for refills and follow up  The patient was educated that we can provide 3 monthly scripts for their medication, it is their responsibility to follow the instructions.  Side effects or complications from medications: No  Patient is aware that pain medications are meant to minimize the severity of the pain to allow their pain levels to improve to allow for better function. They are aware of that pain medications cannot totally remove their pain.  Due for UDT ( at least once per year) : 07/2019      Objective:   Physical Exam Vitals signs reviewed.  Constitutional:      General: She is not in acute distress. HENT:     Head: Normocephalic and atraumatic.  Eyes:     General:        Right eye: No discharge.        Left eye: No discharge.  Neck:     Trachea: No tracheal deviation.  Cardiovascular:     Rate and Rhythm: Normal rate and regular rhythm.     Heart sounds: Normal heart sounds. No murmur.  Pulmonary:     Effort: Pulmonary effort is normal. No respiratory distress.     Breath sounds: Normal breath sounds.  Lymphadenopathy:     Cervical: No cervical adenopathy.  Skin:    General: Skin is warm and dry.  Neurological:     Mental Status: She is alert.     Coordination: Coordination normal.  Psychiatric:        Behavior: Behavior normal.     Results for orders placed or performed in  visit on 07/14/18  ToxASSURE Select 56 (MW), Urine  Result Value Ref Range   Summary FINAL          Assessment & Plan:  The patient was seen in followup for chronic pain. A review over at their current pain status was discussed. Drug registry was checked. Prescriptions were given. Discussion was held regarding the importance of compliance with medication as well as pain medication contract.  Time for questions regarding pain management plan occurred. Importance of regular followup visits was discussed. Patient was informed that medication may cause drowsiness  and should not be combined  with other medications/alcohol or street drugs. Patient was cautioned that medication could cause drowsiness. If the patient feels medication is causing altered alertness then do not drive or operate dangerous equipment.  We will send in the prescriptions electronically  Patient with ADD does well with medicine we will send in the prescriptions electronically  Diabetes under good control watch diet continue current measures  Fatty liver liver enzyme over looks good  25 minutes was spent with the patient.  This statement verifies that 25 minutes was indeed spent with the patient.  More than 50% of this visit-total duration of the visit-was spent in counseling and coordination of care. The issues that the patient came in for today as reflected in the diagnosis (s) please refer to documentation for further details.   Hyperlipidemia unable to tolerate statins patient is considering doing a calcium score CAT scan regarding heart disease she will let us know further details  Patient will be seen neurology for further evaluation of the numbness in her hands and arms consistent with probable carpal tunnel

## 2018-10-17 MED ORDER — AMPHETAMINE-DEXTROAMPHET ER 30 MG PO CP24: 30.0000 mg | ORAL_CAPSULE | ORAL | 0 refills | Status: DC

## 2018-10-17 MED ORDER — HYDROCODONE-ACETAMINOPHEN 5-325 MG PO TABS: ORAL_TABLET | ORAL | 0 refills | Status: DC

## 2018-10-17 MED ORDER — AMPHETAMINE-DEXTROAMPHET ER 30 MG PO CP24: ORAL_CAPSULE | ORAL | 0 refills | Status: DC

## 2018-11-04 ENCOUNTER — Other Ambulatory Visit: Payer: Self-pay | Admitting: Obstetrics & Gynecology

## 2018-11-04 NOTE — Telephone Encounter (Signed)
Call to patient. Patient states she thinks she has enough of vivelle-dot patches to get her to appointment on 11-14-2018 with Dr. Sabra Heck. States she is not home, but will check to see if she has enough and return call to office on Monday if she needs refill. RN advised would update pharmacy. Patient agreeable.   Encounter Closed.

## 2018-11-14 ENCOUNTER — Other Ambulatory Visit: Payer: Self-pay

## 2018-11-14 ENCOUNTER — Encounter: Payer: Self-pay | Admitting: Obstetrics & Gynecology

## 2018-11-14 ENCOUNTER — Ambulatory Visit (INDEPENDENT_AMBULATORY_CARE_PROVIDER_SITE_OTHER): Payer: Managed Care, Other (non HMO) | Admitting: Obstetrics & Gynecology

## 2018-11-14 VITALS — BP 146/90 | HR 80 | Resp 16 | Ht 63.0 in | Wt 183.8 lb

## 2018-11-14 DIAGNOSIS — Z01419 Encounter for gynecological examination (general) (routine) without abnormal findings: Secondary | ICD-10-CM | POA: Diagnosis not present

## 2018-11-14 DIAGNOSIS — E785 Hyperlipidemia, unspecified: Secondary | ICD-10-CM

## 2018-11-14 MED ORDER — OXYBUTYNIN 3.9 MG/24HR TD PTTW: 1.0000 | MEDICATED_PATCH | TRANSDERMAL | 12 refills | Status: DC

## 2018-11-14 NOTE — Progress Notes (Signed)
53 y.o. G0P0 Single White or Caucasian female here for annual exam.  Denies vaginal bleeding.    Having continued issues with overall body pain.  Having more neuropathy issues in her hand.  Has appt with Dr. Ellin Goodie, [facility], at the end of the month.  She was referred by Dr. Wolfgang Phoenix.    Patient's last menstrual period was 05/31/2013.          Sexually active: No.  The current method of family planning is status post hysterectomy.    Exercising: No.   Smoker:  no  Health Maintenance: Pap:  2013 Neg  History of abnormal Pap:  no MMG:  06/16/17 BIRADS1:neg.  Has appt scheduled for the first Saturday in March.   Colonoscopy:  08/05/16 normal. F/u 10 years  BMD:   2011 TDaP:  2013 Pneumonia vaccine(s):  2015 Shingrix:  D/w pt today Hep C testing: 07/14/16 Neg  Screening Labs: Dr. Wolfgang Phoenix   reports that she has never smoked. She has never used smokeless tobacco. She reports that she does not drink alcohol or use drugs.  Past Medical History:  Diagnosis Date  . Frequent UTI   . Kidney stone   . Meniere's disease 2017   Dr. Benjamine Mola   . MGUS (monoclonal gammopathy of unknown significance)   . Migraine headache   . Myalgia    Chronic  . Pre-diabetes     Past Surgical History:  Procedure Laterality Date  . APPENDECTOMY    . CHOLECYSTECTOMY    . COLONOSCOPY W/ ENDOSCOPIC [place]  09/27/06   02/2011   . CYSTOSCOPY N/A 07/03/2013   Procedure: CYSTOSCOPY;  Surgeon: Lyman Speller, MD;  Location: [facility];  Service: Gynecology;  Laterality: N/A;  . ESOPHAGOGASTRODUODENOSCOPY    . ROBOTIC ASSISTED TOTAL HYSTERECTOMY WITH BILATERAL SALPINGO OOPHERECTOMY Bilateral 07/03/2013   Procedure: ROBOTIC ASSISTED TOTAL HYSTERECTOMY WITH BILATERAL SALPINGO OOPHORECTOMY;  Surgeon: Lyman Speller, MD;  Location: [facility];  Service: Gynecology;  Laterality: Bilateral;  . TONSILLECTOMY  1973    Current Outpatient Medications  Medication Sig Dispense Refill  . amphetamine-dextroamphetamine (ADDERALL  XR) 30 MG 24 hr capsule Take 1 capsule (30 mg total) by mouth every morning. 30 capsule 0  . betamethasone dipropionate (DIPROLENE) 0.05 % cream Apply topically 2 (two) times daily. 60 g 5  . chlorzoxazone (PARAFON FORTE DSC) 500 MG tablet Take 1 tablet (500 mg total) by mouth 3 (three) times daily as needed for muscle spasms. 45 tablet 4  . diazepam (VALIUM) 5 MG tablet TAKE 1 TABLET AT BEDTIME AS NEEDED. 30 tablet 5  . diclofenac sodium (VOLTAREN) 1 % GEL Apply 4 g topically 4 (four) times daily. 1 Tube 10  . docusate sodium (COLACE) 100 MG capsule Take 100 mg by mouth as needed.     Marland Kitchen estradiol (MINIVELLE) 0.1 MG/24HR patch Place 1 patch (0.1 mg total) onto the skin 2 (two) times a week. 8 patch 13  . FLUoxetine (PROZAC) 10 MG tablet TAKE (1) TABLET BY MOUTH ONCE DAILY. 30 tablet 10  . HYDROcodone-acetaminophen (NORCO/VICODIN) 5-325 MG tablet 1 every 4 hours as needed pain maximum 5/day 150 tablet 0  . lidocaine (XYLOCAINE JELLY) 2 % jelly Apply 1 application topically as needed. 30 mL 12  . lidocaine (XYLOCAINE) 5 % ointment Apply 1 application topically as needed. 35.44 g 5  . loratadine (CLARITIN) 10 MG tablet Take 10 mg by mouth daily.    . meloxicam (MOBIC) 15 MG tablet 1 qd 30 tablet 12  .  metFORMIN (GLUCOPHAGE-XR) 500 MG 24 hr tablet TAKE (1) TABLET BY MOUTH EACH MORNING. 30 tablet 5  . mupirocin ointment (BACTROBAN) 2 % Apply to affected area 2 times daily 22 g 4  . omeprazole (PRILOSEC) 40 MG capsule TAKE ONE CAPSULE BY MOUTH ONCE DAILY. 30 capsule 5  . propranolol (INDERAL) 10 MG tablet TAKE 1TABLETS TWICE A DAY AS NEEDED. 60 tablet 5  . triamcinolone (NASACORT) 55 MCG/ACT AERO nasal inhaler USE 1 SPRAY IN EACH NOSTRIL DAILY. 1 Inhaler 5  . triamcinolone cream (KENALOG) 0.1 % Apply to affected area twice daily as needed 45 g 4   No current facility-administered medications for this visit.     Family History  Problem Relation Age of Onset  . COPD Mother        Deceased, 57  .  Prostate cancer Father        Deceased, 10  . Emphysema Sister   . Healthy Brother   . Liver cancer Other        and pancreatic cancer-paternal cousin  . Liver cancer Other        paternal cousin  . Breast cancer Cousin     Review of Systems  Genitourinary: Positive for urgency.       Vulvar itching  Night urination   All other systems reviewed and are negative.   Exam:   BP (!) 146/90 (BP Location: Right Arm, Patient Position: Sitting, Cuff Size: Large)   Pulse 80   Resp 16   Ht 5\' 3"  (1.6 m)   Wt 183 lb 12.8 oz (83.4 kg)   LMP 05/31/2013   BMI 32.56 kg/m   Height: 5\' 3"  (160 cm)  Ht Readings from Last 3 Encounters:  11/14/18 5\' 3"  (1.6 m)  10/14/18 5\' 3"  (1.6 m)  07/14/18 5\' 3"  (1.6 m)    General appearance: alert, cooperative and appears stated age Head: Normocephalic, without obvious abnormality, atraumatic Neck: no adenopathy, supple, symmetrical, trachea midline and thyroid normal to inspection and palpation Lungs: clear to auscultation bilaterally Breasts: normal appearance, no masses or tenderness Heart: regular rate and rhythm Abdomen: soft, non-tender; bowel sounds normal; no masses,  no organomegaly Extremities: extremities normal, atraumatic, no cyanosis or edema Skin: Skin color, texture, turgor normal. No rashes or lesions Lymph nodes: Cervical, supraclavicular, and axillary nodes normal. No abnormal inguinal nodes palpated Neurologic: Grossly normal  Pelvic: External genitalia:  no lesions              Urethra:  normal appearing urethra with no masses, tenderness or lesions              Bartholins and Skenes: normal                 Vagina: normal appearing vagina with normal color and discharge, no lesions              Cervix: absent              Pap taken: No. Bimanual Exam:  Uterus:  uterus absent              Adnexa: no mass, fullness, tenderness               Rectovaginal: Confirms               Anus:  normal sphincter tone, no  lesions  Chaperone was present for exam.  A:  Well Woman with normal exam PMP, on HRT Type 2 DM ADHD H/O migraines GERD,  Gastric polyps, IBS, fatty liver disease, h/o elevated liver enzymes H/O robotic TLH/BSO, cystoscopy 9/14, continues on HRT  P:   Mammogram guidelines reviewed.  Doing 3D due to breast density pap smear not indicated RF for Vivelle dot 0.1mg  patches twice weekly.  #8/12RF Lab work done with Dr. Wolfgang Phoenix Shingrix vaccination discussed Trial of oxybutynin 3.9 patches twice weekly.  #8/12RF D/W pt possible cardiology referral. Return annually or prn

## 2018-11-15 ENCOUNTER — Other Ambulatory Visit: Payer: Self-pay | Admitting: Obstetrics & Gynecology

## 2018-11-18 ENCOUNTER — Other Ambulatory Visit: Payer: Self-pay | Admitting: Obstetrics & Gynecology

## 2018-11-18 NOTE — Telephone Encounter (Signed)
Medication refill request: Estradiol Last AEX:  11/14/18 SM Next AEX: 03/15/20 Last MMG (if hormonal medication request): 06/16/17 BIRADS1:neg.  Has appt scheduled for the first Saturday in March Refill authorized: 11/04/17 #8 w/13 refills; today please advise; Order pended for #8 w/0 refills

## 2018-11-28 ENCOUNTER — Other Ambulatory Visit: Payer: Self-pay | Admitting: Family Medicine

## 2018-12-02 ENCOUNTER — Other Ambulatory Visit: Payer: Self-pay | Admitting: Family Medicine

## 2018-12-17 ENCOUNTER — Encounter: Payer: Self-pay | Admitting: Obstetrics & Gynecology

## 2018-12-21 ENCOUNTER — Encounter: Payer: Self-pay | Admitting: Family Medicine

## 2018-12-21 ENCOUNTER — Other Ambulatory Visit: Payer: Self-pay | Admitting: Family Medicine

## 2018-12-21 NOTE — Telephone Encounter (Signed)
I would not recommend CT angio Secondly if a CT angio was desired or wanted I would recommend cardiology consult first  As for cardiac score I do not mind [place] ordering this as requested by the patient

## 2018-12-28 ENCOUNTER — Other Ambulatory Visit: Payer: Self-pay | Admitting: Family Medicine

## 2019-01-03 ENCOUNTER — Other Ambulatory Visit: Payer: Self-pay | Admitting: Family Medicine

## 2019-01-03 ENCOUNTER — Telehealth: Payer: Self-pay | Admitting: Family Medicine

## 2019-01-03 DIAGNOSIS — E7849 Other hyperlipidemia: Secondary | ICD-10-CM

## 2019-01-03 DIAGNOSIS — E119 Type 2 diabetes mellitus without complications: Secondary | ICD-10-CM

## 2019-01-03 NOTE — Telephone Encounter (Signed)
Pt returned call and verbalized understanding. Pt states she will call back a little bit close to appt date to decide if she would like to do phone visit or office visit

## 2019-01-03 NOTE — Telephone Encounter (Signed)
Last labs 10/11/2018- Lipid, liver, HgbA1c and Microalbumin urine

## 2019-01-03 NOTE — Telephone Encounter (Signed)
Lab orders placed. Left message to return call  

## 2019-01-03 NOTE — Telephone Encounter (Signed)
Met 7, lipid, A1c If the patient is doing well and would like to do a phone visit we can do so otherwise if she would rather keep her appointment here at the office we will see her on the 31st

## 2019-01-03 NOTE — Telephone Encounter (Signed)
Patient has appt 01/10/19 for 3 month follow up with Dr.Scott, requesting blood work.

## 2019-01-10 ENCOUNTER — Other Ambulatory Visit: Payer: Self-pay

## 2019-01-10 ENCOUNTER — Ambulatory Visit: Payer: Managed Care, Other (non HMO) | Admitting: Family Medicine

## 2019-01-10 ENCOUNTER — Encounter: Payer: Self-pay | Admitting: Family Medicine

## 2019-01-10 ENCOUNTER — Ambulatory Visit (INDEPENDENT_AMBULATORY_CARE_PROVIDER_SITE_OTHER): Payer: Managed Care, Other (non HMO) | Admitting: Family Medicine

## 2019-01-10 DIAGNOSIS — E119 Type 2 diabetes mellitus without complications: Secondary | ICD-10-CM | POA: Diagnosis not present

## 2019-01-10 DIAGNOSIS — M5431 Sciatica, right side: Secondary | ICD-10-CM | POA: Diagnosis not present

## 2019-01-10 DIAGNOSIS — R5383 Other fatigue: Secondary | ICD-10-CM

## 2019-01-10 MED ORDER — HYDROCODONE-ACETAMINOPHEN 5-325 MG PO TABS: ORAL_TABLET | ORAL | 0 refills | Status: DC

## 2019-01-10 MED ORDER — LIDOCAINE 5 % EX OINT: 1.0000 "application " | TOPICAL_OINTMENT | CUTANEOUS | 5 refills | Status: DC | PRN

## 2019-01-10 MED ORDER — AMPHETAMINE-DEXTROAMPHET ER 30 MG PO CP24: 30.0000 mg | ORAL_CAPSULE | Freq: Every day | ORAL | 0 refills | Status: DC

## 2019-01-10 MED ORDER — AMPHETAMINE-DEXTROAMPHET ER 30 MG PO CP24: 30.0000 mg | ORAL_CAPSULE | ORAL | 0 refills | Status: DC

## 2019-01-10 NOTE — Progress Notes (Signed)
Subjective:    Patient ID: Lisa Li, female    DOB: 19-Oct-1965, 53 y.o.   MRN: [id number]  HPI This patient was seen today for chronic pain. Takes for back pain and hip pain, leg pain.   The medication list was reviewed and updated.   -Compliance with medication: takes 3 -5 per day  - Number patient states they take daily: 3 -5 per day  -when was the last dose patient took? Last night  The patient was advised the importance of maintaining medication and not using illegal substances with these.  Here for refills and follow up  The patient was educated that we can provide 3 monthly scripts for their medication, it is their responsibility to follow the instructions.  Side effects or complications from medications: none  Patient is aware that pain medications are meant to minimize the severity of the pain to allow their pain levels to improve to allow for better function. They are aware of that pain medications cannot totally remove their pain.  Due for UDT ( at least once per year) : last one 07/14/18  Needs refills on adderall xr 30mg  one daily. No issues with adderall. Wants to stay on same dose.  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 Uses for spot on left thigh that itches and burns.   Concerns about when to do bloodwork. Pt already has orders.   Virtual Visit via Telephone Note  I connected with Lisa Li  on 01/10/19 at 11:00 AM EDT by telephone and verified that I am speaking with the correct person using two identifiers.   I discussed the limitations, risks, security and privacy concerns of performing an evaluation and management service by telephone and the availability of in person appointments. I also discussed with the patient that there may be a patient responsible charge related to this service. The patient expressed understanding and agreed to proceed.   History of Present Illness:    Observations/Objective:   Assessment and Plan:   Follow Up Instructions:    I discussed the assessment and treatment plan with the patient. The patient was provided an opportunity to ask questions and all were answered. The patient agreed with the plan and demonstrated an understanding of the instructions.   The patient was advised to call back or seek an in-person evaluation if the symptoms worsen or if the condition fails to improve as anticipated.  I provided 17 minutes of non-face-to-face time during this encounter.   Dayton Bailiff, LPN  Was done by tele-visit video on available   Video not available   Review of Systems     Objective:   Physical Exam Unable to do physical exam because of patient not on site       Assessment & Plan:  Chronic pain 3 prescriptions on pain medicine were sent in without difficulty  Diabetes under good control continue  current measures  ADD does well with current medication prescription sent in  Follow-up if progressive troubles otherwise see back 3 months  Lab work in 3 months

## 2019-01-27 ENCOUNTER — Other Ambulatory Visit: Payer: Self-pay | Admitting: Family Medicine

## 2019-01-27 ENCOUNTER — Other Ambulatory Visit: Payer: Self-pay | Admitting: Obstetrics & Gynecology

## 2019-01-27 NOTE — Telephone Encounter (Signed)
Medication refill request: estradiol patch  Last AEX:  11/14/18 Next AEX: 03/15/20 SM Last MMG (if hormonal medication request): 12/17/18 [id number]:Neg  Refill authorized: 11/18/18 #8patch /0R. Today  Please advise.

## 2019-03-09 ENCOUNTER — Encounter: Payer: Self-pay | Admitting: Family Medicine

## 2019-03-09 DIAGNOSIS — G5600 Carpal tunnel syndrome, unspecified upper limb: Secondary | ICD-10-CM

## 2019-03-10 NOTE — Addendum Note (Signed)
Addended by: Carmelina Noun on: 03/10/2019 02:12 PM   Modules accepted: Orders

## 2019-03-10 NOTE — Telephone Encounter (Signed)
Please go ahead with this referral regarding bilateral carpal tunnel Please send patient notification that referral has been initiated.

## 2019-03-23 ENCOUNTER — Telehealth: Payer: Self-pay | Admitting: Family Medicine

## 2019-03-23 NOTE — Telephone Encounter (Signed)
Fax form [facility] stating that PA for Dextroamp-amphet ER 30 mg capsules have been cancelled due to this med not requiring a PA.

## 2019-03-28 ENCOUNTER — Encounter: Payer: Self-pay | Admitting: Family Medicine

## 2019-04-21 ENCOUNTER — Telehealth: Payer: Self-pay | Admitting: Family Medicine

## 2019-04-21 NOTE — Telephone Encounter (Signed)
Pt will need refill on amphetamine-dextroamphetamine (ADDERALL XR) 30 MG 24 hr capsule and HYDROcodone-acetaminophen (NORCO/VICODIN) 5-325 MG tablet  [facility] - [place], [place] - [address]   Pt has follow up on 7/28 virtual visit.   She will need refills around the 15th

## 2019-04-21 NOTE — Telephone Encounter (Signed)
Please advise. Thank you

## 2019-04-24 ENCOUNTER — Other Ambulatory Visit: Payer: Self-pay | Admitting: Family Medicine

## 2019-04-24 MED ORDER — HYDROCODONE-ACETAMINOPHEN 5-325 MG PO TABS: ORAL_TABLET | ORAL | 0 refills | Status: DC

## 2019-04-24 MED ORDER — AMPHETAMINE-DEXTROAMPHET ER 30 MG PO CP24: 30.0000 mg | ORAL_CAPSULE | Freq: Every day | ORAL | 0 refills | Status: DC

## 2019-04-24 NOTE — Telephone Encounter (Signed)
Send to dr Nicki Reaper

## 2019-04-24 NOTE — Telephone Encounter (Signed)
Left message to return call 

## 2019-04-24 NOTE — Telephone Encounter (Signed)
Please let the patient know I did refill both medicines keep her follow-up visit please virtual I did check the drug registry Based on her previous fill date the next prescription will be able to be filled on July 20

## 2019-04-25 NOTE — Telephone Encounter (Signed)
Discussed with pt. Pt verbalized understanding of all

## 2019-05-02 ENCOUNTER — Telehealth: Payer: Self-pay | Admitting: Family Medicine

## 2019-05-02 NOTE — Telephone Encounter (Signed)
Left message for patient to return call. Need to know where she is going to have lab work done.

## 2019-05-02 NOTE — Telephone Encounter (Signed)
Needs lab orders printed out so she can go somewhere other than [facility].  Patient would like to pick up tomorrow.  No need to call patient back unless problem, she will just come by and pick up orders.

## 2019-05-03 ENCOUNTER — Telehealth: Payer: Self-pay | Admitting: Family Medicine

## 2019-05-03 NOTE — Telephone Encounter (Signed)
Patient would like her lab sheet printed out because she is going to [employer] where she works and would like to pick them up today. She works for [employer] and they cant find her in there [facility] system.

## 2019-05-03 NOTE — Telephone Encounter (Signed)
Lab orders printed and placed up front for pickup

## 2019-05-03 NOTE — Telephone Encounter (Signed)
Left message to return call 

## 2019-05-03 NOTE — Telephone Encounter (Signed)
Pt is going to [facility] at [place]; they are unable see our orders. Labs printed and up front awaiting pickup. Pt is aware

## 2019-05-05 ENCOUNTER — Encounter: Payer: Self-pay | Admitting: *Deleted

## 2019-05-09 ENCOUNTER — Ambulatory Visit (INDEPENDENT_AMBULATORY_CARE_PROVIDER_SITE_OTHER): Payer: Managed Care, Other (non HMO) | Admitting: Family Medicine

## 2019-05-09 ENCOUNTER — Other Ambulatory Visit: Payer: Self-pay

## 2019-05-09 ENCOUNTER — Encounter: Payer: Self-pay | Admitting: Family Medicine

## 2019-05-09 DIAGNOSIS — E119 Type 2 diabetes mellitus without complications: Secondary | ICD-10-CM | POA: Diagnosis not present

## 2019-05-09 DIAGNOSIS — E1140 Type 2 diabetes mellitus with diabetic neuropathy, unspecified: Secondary | ICD-10-CM

## 2019-05-09 DIAGNOSIS — F988 Other specified behavioral and emotional disorders with onset usually occurring in childhood and adolescence: Secondary | ICD-10-CM

## 2019-05-09 DIAGNOSIS — G8929 Other chronic pain: Secondary | ICD-10-CM

## 2019-05-09 DIAGNOSIS — G894 Chronic pain syndrome: Secondary | ICD-10-CM

## 2019-05-09 DIAGNOSIS — K760 Fatty (change of) liver, not elsewhere classified: Secondary | ICD-10-CM | POA: Diagnosis not present

## 2019-05-09 DIAGNOSIS — K219 Gastro-esophageal reflux disease without esophagitis: Secondary | ICD-10-CM

## 2019-05-09 DIAGNOSIS — I2510 Atherosclerotic heart disease of native coronary artery without angina pectoris: Secondary | ICD-10-CM

## 2019-05-09 HISTORY — DX: Other chronic pain: G89.29

## 2019-05-09 HISTORY — DX: Atherosclerotic heart disease of native coronary artery without angina pectoris: I25.10

## 2019-05-09 MED ORDER — AMPHETAMINE-DEXTROAMPHET ER 30 MG PO CP24: 30.0000 mg | ORAL_CAPSULE | ORAL | 0 refills | Status: DC

## 2019-05-09 MED ORDER — HYDROCODONE-ACETAMINOPHEN 5-325 MG PO TABS: ORAL_TABLET | ORAL | 0 refills | Status: DC

## 2019-05-09 MED ORDER — AMPHETAMINE-DEXTROAMPHET ER 30 MG PO CP24: 30.0000 mg | ORAL_CAPSULE | Freq: Every day | ORAL | 0 refills | Status: DC

## 2019-05-09 NOTE — Progress Notes (Signed)
Subjective:    Patient ID: Lisa Li, female    DOB: 07-06-1966, 53 y.o.   MRN: [id number]      Diabetes She presents for her follow-up diabetic visit. She has type 2 diabetes mellitus. There are no hypoglycemic associated symptoms. Pertinent negatives for hypoglycemia include no confusion or dizziness. There are no diabetic associated symptoms. Pertinent negatives for diabetes include no chest pain, no fatigue, no polydipsia, no polyphagia and no weakness. There are no hypoglycemic complications.  Pt states that Dr.Kaylanni Ezelle wants her to see a cardiologist. Pt states she does not check sugars. Pt has not had blood work done at this time but does plan to have them done this weekend at work.   Patient had a calcium test that showed a lesion possible developing blockages concerning will need further looking into  This patient has adult ADD. Takes medication responsibly. Medication does help the patient focus in be more functional. Patient relates that they are or not abusing the medication or misusing the medication. The patient understands that if they're having any negative side effects such as elevated high blood pressure severe headaches they would need stop the medication follow-up immediately. They also understand that the prescriptions are to last for 3 months then the patient will need to follow-up before having further prescriptions.  Patient compliance overall good  Does medication help patient function /attention better does help her function and stay more attentive at work  Side effects denies side effects  This patient was seen today for chronic pain  The medication list was reviewed and updated.   -Compliance with medication: She does do a good job taking her pain medicine she hopes that once her hand gets fixed and her jaw does better she will be able to reduce the amount  - Number patient states they take daily: She takes approximately 5 a day  -when was the last dose  patient took?  Earlier today  The patient was advised the importance of maintaining medication and not using illegal substances with these.  Here for refills and follow up  The patient was educated that we can provide 3 monthly scripts for their medication, it is their responsibility to follow the instructions.  Side effects or complications from medications: Denies any side effect issues  Patient is aware that pain medications are meant to minimize the severity of the pain to allow their pain levels to improve to allow for better function. They are aware of that pain medications cannot totally remove their pain.  Due for UDT ( at least once per year) : Later this year  Patient has carpal tunnel surgery on the right side coming up in September  Patient here for follow-up regarding cholesterol.  The patient does have hyperlipidemia.  Patient does try to maintain a reasonable diet.  Patient does not tolerate medication therefore does not take the medication on a regular basis.  She would be best served by utilizing the injectables so far her insurance has denied this but with her calcium score test the way it is I believe that they will approve it hopefully patient could not tolerate statins cause severe muscle pain tried several statins prior blood work results reviewed with the patient.  The patient is aware of his cholesterol goals and the need to keep it under good control to lessen the risk of disease.     Pt would like to speak to provider about shot she needs to have for high cholesterol. Pt was  sent for cardiac score and pt states hopefully that will suffice for her to be approve for shot.   Virtual Visit via Video Note  I connected with Lisa Li on 05/09/19 at  2:00 PM EDT by a video enabled telemedicine application and verified that I am speaking with the correct person using two identifiers.  Location: Patient: home Provider: office   I discussed the limitations of  evaluation and management by telemedicine and the availability of in person appointments. The patient expressed understanding and agreed to proceed.  History of Present Illness:    Observations/Objective:   Assessment and Plan:   Follow Up Instructions:    I discussed the assessment and treatment plan with the patient. The patient was provided an opportunity to ask questions and all were answered. The patient agreed with the plan and demonstrated an understanding of the instructions.   The patient was advised to call back or seek an in-person evaluation if the symptoms worsen or if the condition fails to improve as anticipated.  I provided 25 minutes of non-face-to-face time during this encounter.   Vicente Males, LPN   25 minutes was spent with the patient.  This statement verifies that 25 minutes was indeed spent with the patient.  More than 50% of this visit-total duration of the visit-was spent in counseling and coordination of care. The issues that the patient came in for today as reflected in the diagnosis (s) please refer to documentation for further details.  Review of Systems  Constitutional: Negative for activity change, appetite change and fatigue.  HENT: Negative for congestion and rhinorrhea.   Respiratory: Negative for cough and shortness of breath.   Cardiovascular: Negative for chest pain and leg swelling.  Gastrointestinal: Negative for abdominal pain and diarrhea.  Endocrine: Negative for polydipsia and polyphagia.  Musculoskeletal: Positive for arthralgias and back pain.  Skin: Negative for color change.  Neurological: Negative for dizziness and weakness.  Psychiatric/Behavioral: Negative for behavioral problems and confusion.       Objective:     Patient had virtual visit Appears to be in no distress Atraumatic Neuro able to relate and oriented No apparent resp distress Color normal        Assessment & Plan:  1. Gastroesophageal reflux disease  without esophagitis Reflux under good control continue medication watch diet  2. Fatty liver History fatty liver importance of watching diet activity keeping weight down  3. Controlled type 2 diabetes mellitus without complication, without long-term current use of insulin (HCC) Diabetes overall decent control watch diet continue medications  4. Painful diabetic neuropathy (HCC) Neuropathy uses pain medicine intermittently denies any other particular troubles  5. Attention deficit disorder, unspecified hyperactivity presence ADD medicine does help her continue these  6. Chronic pain syndrome Has chronic pain syndrome takes medication on a regular basis The patient was seen in followup for chronic pain. A review over at their current pain status was discussed. Drug registry was checked. Prescriptions were given. Discussion was held regarding the importance of compliance with medication as well as pain medication contract.  Time for questions regarding pain management plan occurred. Importance of regular followup visits was discussed. Patient was informed that medication may cause drowsiness and should not be combined  with other medications/alcohol or street drugs. Patient was cautioned that medication could cause drowsiness. If the patient feels medication is causing altered alertness then do not drive or operate dangerous equipment.  Drug registry was checked  7. Coronary artery disease involving  native coronary artery of native heart without angina pectoris Referral to cardiology because of abnormal cardiac calcium score along with risk factors  Hyperlipidemia poor control unable to tolerate statins we will try to get Repatha approved Referral to Dr. Scheryl Darter for cardiac work-up

## 2019-05-10 ENCOUNTER — Other Ambulatory Visit: Payer: Self-pay | Admitting: Obstetrics and Gynecology

## 2019-05-10 ENCOUNTER — Other Ambulatory Visit: Payer: Self-pay | Admitting: *Deleted

## 2019-05-10 DIAGNOSIS — R0989 Other specified symptoms and signs involving the circulatory and respiratory systems: Secondary | ICD-10-CM

## 2019-05-10 DIAGNOSIS — R6889 Other general symptoms and signs: Secondary | ICD-10-CM

## 2019-05-10 DIAGNOSIS — E785 Hyperlipidemia, unspecified: Secondary | ICD-10-CM

## 2019-05-10 DIAGNOSIS — I2510 Atherosclerotic heart disease of native coronary artery without angina pectoris: Secondary | ICD-10-CM

## 2019-05-10 NOTE — Telephone Encounter (Signed)
Medication refill request:Vivelle Dot  Last AEX:  11/14/18 Next AEX: 03/15/20 Last MMG (if hormonal medication request): 12/17/18 bi-rads 1 neg  Refill authorized:  #24 with 3 Rf

## 2019-05-10 NOTE — Progress Notes (Signed)
Referral put in.

## 2019-05-16 ENCOUNTER — Telehealth: Payer: Self-pay | Admitting: Family Medicine

## 2019-05-16 NOTE — Telephone Encounter (Signed)
Please advise. Thank you

## 2019-05-16 NOTE — Telephone Encounter (Signed)
Said she had a virtual visit last week and Dr. Nicki Reaper was going to send over a prescription for a cream that was prilocaine and lidocaine mixed.  Pharmacy does not have.  Also was checking on Adderall and Pain medication and I told her those had already been sent to [facility]. With confirmed receipt so she could check with the pharmacy.  Also wanted to let Dr. Nicki Reaper know that she had her labwork at [facility] this past weekend and they are suppose to be sending [place] the results.

## 2019-05-19 ENCOUNTER — Other Ambulatory Visit: Payer: Self-pay | Admitting: Family Medicine

## 2019-05-19 ENCOUNTER — Encounter: Payer: Self-pay | Admitting: Family Medicine

## 2019-05-19 NOTE — Telephone Encounter (Signed)
Cream was called into [place]. Left message to return call to discuss the rest of message with patient.

## 2019-05-19 NOTE — Telephone Encounter (Signed)
Nurses please talk with pharmacy to see if they have prilocaine with lidocaine mixed- if so small tube with 1 refill apply twice daily as needed  Also I recommend Repatha 140 mg subcutaneous every 2 weeks, talk with pharmacy to see how it is best to prescribe this then go ahead with prescription with 5 months of refill This patient cannot tolerate statins we have tried numerous She has coronary artery disease and diabetes More than likely this will be denied and we will have to appeal it  As for her lab work her LDL cholesterol 106 HDL 35 total 219 kidney functions look good  Please let the patient know that we have sent these in and that more than likely we will have to go through a approval process for Repatha over the course the next 1 to 2 weeks

## 2019-05-19 NOTE — Telephone Encounter (Addendum)
Patient notified and verbalized understanding and stated she was awaiting referral to see cardiology and wonders if it will be easier for you just to talk to cardiology and get them to order the [place] and they would have a easier time getting it approved

## 2019-05-21 NOTE — Telephone Encounter (Signed)
[address] one way half dozen the other I can communicate with the cardiologist that if she would like for [place] to do so then we can let them handle all of this They may have better luck given they are a specialist  Verify with the patient what would she like for [place] to do

## 2019-05-22 NOTE — Telephone Encounter (Signed)
Left message to return call 

## 2019-05-22 NOTE — Telephone Encounter (Signed)
Pt states whichever way she can get her mediation approved and which ever would be simpler. Pt states that she know Dr.Scott has to go through a process. Pt just wants both providers on same page. Please advise. Thank you

## 2019-05-22 NOTE — Telephone Encounter (Signed)
Pt returned call and held for awhile. I told her we would have a nurse call her back.

## 2019-05-24 NOTE — Telephone Encounter (Signed)
Please inform the patient I agree with the patient that it is more likely that the specialist will be able to get her Repatha approved I will communicate to the cardiologist As the cardiology office assign her an office visit time with the cardiologist. (If so verify that she is seeing Dr. Primus Bravo and when) Then I can send communication to them regarding the [place]

## 2019-05-24 NOTE — Telephone Encounter (Signed)
Left message to return call 

## 2019-05-26 NOTE — Telephone Encounter (Signed)
Sent patient a MyChart message

## 2019-06-02 ENCOUNTER — Other Ambulatory Visit: Payer: Self-pay | Admitting: Family Medicine

## 2019-06-08 ENCOUNTER — Encounter: Payer: Self-pay | Admitting: Family Medicine

## 2019-06-08 ENCOUNTER — Telehealth: Payer: Self-pay | Admitting: Family Medicine

## 2019-06-08 NOTE — Telephone Encounter (Signed)
The patient is seeing Dr. Mauricio Po cardiologist at [facility] is with [facility] Her appointment September 24 I recently did a letter regarding this referral Please make sure that this along with recent notes are sent forward for this appointment thank you

## 2019-06-13 DIAGNOSIS — G5603 Carpal tunnel syndrome, bilateral upper limbs: Secondary | ICD-10-CM

## 2019-06-13 HISTORY — DX: Carpal tunnel syndrome, bilateral upper limbs: G56.03

## 2019-06-13 HISTORY — PX: CARPAL TUNNEL RELEASE: SHX101

## 2019-06-16 DIAGNOSIS — G5603 Carpal tunnel syndrome, bilateral upper limbs: Secondary | ICD-10-CM | POA: Insufficient documentation

## 2019-06-18 ENCOUNTER — Encounter: Payer: Self-pay | Admitting: Family Medicine

## 2019-07-06 ENCOUNTER — Other Ambulatory Visit: Payer: Self-pay | Admitting: Family Medicine

## 2019-07-06 DIAGNOSIS — E7800 Pure hypercholesterolemia, unspecified: Secondary | ICD-10-CM | POA: Insufficient documentation

## 2019-07-06 DIAGNOSIS — I10 Essential (primary) hypertension: Secondary | ICD-10-CM | POA: Insufficient documentation

## 2019-07-06 NOTE — Telephone Encounter (Signed)
Refill 3 months

## 2019-07-17 ENCOUNTER — Other Ambulatory Visit: Payer: Self-pay | Admitting: Family Medicine

## 2019-07-20 ENCOUNTER — Telehealth: Payer: Self-pay | Admitting: Family Medicine

## 2019-07-20 DIAGNOSIS — E119 Type 2 diabetes mellitus without complications: Secondary | ICD-10-CM

## 2019-07-20 DIAGNOSIS — K760 Fatty (change of) liver, not elsewhere classified: Secondary | ICD-10-CM

## 2019-07-20 DIAGNOSIS — E785 Hyperlipidemia, unspecified: Secondary | ICD-10-CM

## 2019-07-20 NOTE — Telephone Encounter (Signed)
Pt needs lab orders, please print them because she gets them done at [facility] in [place]  Please advise & call pt when ready

## 2019-07-20 NOTE — Telephone Encounter (Signed)
I recommend A1c, lipid, liver, metabolic 7

## 2019-07-20 NOTE — Telephone Encounter (Signed)
Blood work papers up front for pick up. Left message to return call to notify patient.

## 2019-07-20 NOTE — Telephone Encounter (Signed)
Last labs 05/2019: Met 7 and HgbA1c

## 2019-07-27 NOTE — Telephone Encounter (Signed)
Pt.notified

## 2019-08-07 ENCOUNTER — Other Ambulatory Visit: Payer: Self-pay | Admitting: Family Medicine

## 2019-08-07 ENCOUNTER — Encounter: Payer: Self-pay | Admitting: Family Medicine

## 2019-08-07 ENCOUNTER — Other Ambulatory Visit: Payer: Self-pay

## 2019-08-07 ENCOUNTER — Ambulatory Visit (INDEPENDENT_AMBULATORY_CARE_PROVIDER_SITE_OTHER): Payer: Managed Care, Other (non HMO) | Admitting: Family Medicine

## 2019-08-07 VITALS — BP 136/83

## 2019-08-07 DIAGNOSIS — F988 Other specified behavioral and emotional disorders with onset usually occurring in childhood and adolescence: Secondary | ICD-10-CM

## 2019-08-07 DIAGNOSIS — E1140 Type 2 diabetes mellitus with diabetic neuropathy, unspecified: Secondary | ICD-10-CM

## 2019-08-07 DIAGNOSIS — E119 Type 2 diabetes mellitus without complications: Secondary | ICD-10-CM

## 2019-08-07 DIAGNOSIS — E7849 Other hyperlipidemia: Secondary | ICD-10-CM | POA: Diagnosis not present

## 2019-08-07 DIAGNOSIS — G894 Chronic pain syndrome: Secondary | ICD-10-CM

## 2019-08-07 MED ORDER — AMPHETAMINE-DEXTROAMPHET ER 30 MG PO CP24: 30.0000 mg | ORAL_CAPSULE | Freq: Every day | ORAL | 0 refills | Status: DC

## 2019-08-07 MED ORDER — HYDROCODONE-ACETAMINOPHEN 5-325 MG PO TABS: ORAL_TABLET | ORAL | 0 refills | Status: DC

## 2019-08-07 MED ORDER — LOSARTAN POTASSIUM 25 MG PO TABS: 25.0000 mg | ORAL_TABLET | Freq: Every day | ORAL | 5 refills | Status: DC

## 2019-08-07 MED ORDER — VALSARTAN 40 MG PO TABS: 40.0000 mg | ORAL_TABLET | Freq: Every day | ORAL | 5 refills | Status: DC

## 2019-08-07 MED ORDER — AMPHETAMINE-DEXTROAMPHET ER 30 MG PO CP24: 30.0000 mg | ORAL_CAPSULE | ORAL | 0 refills | Status: DC

## 2019-08-07 NOTE — Progress Notes (Signed)
Subjective:    Patient ID: Lisa Li, female    DOB: 1966-08-04, 53 y.o.   MRN: [id number]  HPI This patient was seen today for chronic pain  The medication list was reviewed and updated.   -Compliance with medication: takes 4 a day and on bad days takes 5 most of the time 4 daily - Number patient states they take daily: takes 4 a day. Bad days takes 5 a day  -when was the last dose patient took? today  The patient was advised the importance of maintaining medication and not using illegal substances with these.  Here for refills and follow up  The patient was educated that we can provide 3 monthly scripts for their medication, it is their responsibility to follow the instructions.  Side effects or complications from medications: none  Patient is aware that pain medications are meant to minimize the severity of the pain to allow their pain levels to improve to allow for better function. They are aware of that pain medications cannot totally remove their pain.  Due for UDT ( at least once per year) : last one 07/14/18. None today since doing virtual visit   Pt needs to find neurologist in [facility]. Sees [facility] but he is in [facility].   Having side effects from losartan. Specialist put her on 50mg  and she is only able to take one half tablet.   Pt states she will do bw week after next.  Got flu vaccine yesterday at work.   Patient here for follow-up regarding cholesterol.  The patient does have hyperlipidemia.  Patient does try to maintain a reasonable diet.  Patient does take the medication on a regular basis.  Denies missing a dose.  The patient denies any obvious side effects.  Prior blood work results reviewed with the patient.  The patient is aware of his cholesterol goals and the need to keep it under good control to lessen the risk of disease.  Patient does have hyperlipidemia but does not tolerate statins.  Patient does have some underlying blood pressure and  heart related issues but nothing severe.  Follow-up with cardiology.  They started her on losartan.  She states it caused myalgia and joint pains.  So therefore she is requesting switch over to valsartan she will see cardiology later in November  The patient was seen today as part of a comprehensive diabetic check up.the patient does have diabetes.  The patient follows here on a regular basis.  The patient relates medication compliance. No significant side effects to the medications. Denies any low glucose spells. Relates compliance with diet to a reasonable level. Patient does do labwork intermittently and understands the dangers of diabetes.  She does try to follow her diet she does take her medications as directed states her sugars been under decent control  Virtual Visit via Video Note  I connected with Lisa Li on 08/07/19 at  1:10 PM EDT by a video enabled telemedicine application and verified that I am speaking with the correct person using two identifiers.  Location: Patient: home Provider: office   I discussed the limitations of evaluation and management by telemedicine and the availability of in person appointments. The patient expressed understanding and agreed to proceed.  History of Present Illness:    Observations/Objective:   Assessment and Plan:   Follow Up Instructions:    I discussed the assessment and treatment plan with the patient. The patient was provided an opportunity to ask questions  and all were answered. The patient agreed with the plan and demonstrated an understanding of the instructions.   The patient was advised to call back or seek an in-person evaluation if the symptoms worsen or if the condition fails to improve as anticipated.  I provided 25 minutes of non-face-to-face time during this encounter.   Patient does inquire regarding CBD oil.  It does seem that this can help with some peoples back pain and neuropathy I think it is reasonable to  give it a try but to use only reputable sources of it.         Review of Systems  Constitutional: Negative for activity change, appetite change and fatigue.  HENT: Negative for congestion and rhinorrhea.   Respiratory: Negative for cough and shortness of breath.   Cardiovascular: Negative for chest pain and leg swelling.  Gastrointestinal: Negative for abdominal pain and diarrhea.  Endocrine: Negative for polydipsia and polyphagia.  Musculoskeletal: Positive for arthralgias and back pain.  Skin: Negative for color change.  Neurological: Negative for dizziness and weakness.  Psychiatric/Behavioral: Negative for behavioral problems and confusion.       Objective:   Physical Exam  Patient had virtual visit Appears to be in no distress Atraumatic Neuro able to relate and oriented No apparent resp distress Color normal       Assessment & Plan:  1. Controlled type 2 diabetes mellitus without complication, without long-term current use of insulin ([place]) Significant diabetes related issues but does well with taking the medication.  Will watch her sugars closely.  She will do lab work in the near future  2. Painful diabetic neuropathy ([place]) She does understand the importance of keeping A1c under good control she is trying to watch her diet take her medicines she does use her pain medicine does not exceed the quantity per day denies abusing it  3. Other hyperlipidemia She will do her lab work previous lab work showed a hyperlipidemia but patient does not tolerate statins.  4. Attention deficit disorder, unspecified hyperactivity presence Does well on her ADD medicine helps her stay focused.  3 scripts were sent in  5. Chronic pain syndrome The patient was seen in followup for chronic pain. A review over at their current pain status was discussed. Drug registry was checked. Prescriptions were given. Discussion was held regarding the importance of compliance with medication as  well as pain medication contract.  Time for questions regarding pain management plan occurred. Importance of regular followup visits was discussed. Patient was informed that medication may cause drowsiness and should not be combined  with other medications/alcohol or street drugs. Patient was cautioned that medication could cause drowsiness. If the patient feels medication is causing altered alertness then do not drive or operate dangerous equipment.  25 minutes was spent with the patient.  This statement verifies that 25 minutes was indeed spent with the patient.  More than 50% of this visit-total duration of the visit-was spent in counseling and coordination of care. The issues that the patient came in for today as reflected in the diagnosis (s) please refer to documentation for further details.

## 2019-08-10 ENCOUNTER — Encounter: Payer: Self-pay | Admitting: Family Medicine

## 2019-09-03 ENCOUNTER — Encounter: Payer: Self-pay | Admitting: Family Medicine

## 2019-09-27 ENCOUNTER — Telehealth: Payer: Self-pay | Admitting: Family Medicine

## 2019-09-27 NOTE — Telephone Encounter (Signed)
Please update her prescription I recommend losartan 50 mg 1 daily, and she may have 6 months prescription

## 2019-09-27 NOTE — Telephone Encounter (Signed)
Please advise. Thank you

## 2019-09-27 NOTE — Telephone Encounter (Signed)
Pt requesting to switch back to Losartan 50mg  - states that this was changed to Valsartan & the side effects are worse so pt wants to switch back  Please advise & call pt    [facility] [place]

## 2019-09-28 ENCOUNTER — Other Ambulatory Visit: Payer: Self-pay

## 2019-09-28 MED ORDER — LOSARTAN POTASSIUM 50 MG PO TABS: ORAL_TABLET | ORAL | 1 refills | Status: DC

## 2019-09-28 NOTE — Telephone Encounter (Signed)
Pt contacted and verbalized understanding. Losartan sent to pharmacy

## 2019-10-09 ENCOUNTER — Telehealth: Payer: Self-pay | Admitting: Family Medicine

## 2019-10-09 ENCOUNTER — Encounter: Payer: Self-pay | Admitting: Family Medicine

## 2019-10-09 NOTE — Telephone Encounter (Signed)
Patient did blood work at the end of November.  A1c is 8.5 Kidney functions look good AST and ALT were 60 and 65 LDL 114 total cholesterol 211

## 2019-10-12 ENCOUNTER — Other Ambulatory Visit: Payer: Self-pay | Admitting: Family Medicine

## 2019-12-07 ENCOUNTER — Other Ambulatory Visit: Payer: Self-pay | Admitting: Family Medicine

## 2019-12-07 ENCOUNTER — Telehealth: Payer: Self-pay | Admitting: Family Medicine

## 2019-12-07 DIAGNOSIS — I10 Essential (primary) hypertension: Secondary | ICD-10-CM

## 2019-12-07 DIAGNOSIS — Z114 Encounter for screening for human immunodeficiency virus [HIV]: Secondary | ICD-10-CM

## 2019-12-07 DIAGNOSIS — E119 Type 2 diabetes mellitus without complications: Secondary | ICD-10-CM

## 2019-12-07 DIAGNOSIS — E7849 Other hyperlipidemia: Secondary | ICD-10-CM

## 2019-12-07 DIAGNOSIS — Z79899 Other long term (current) drug therapy: Secondary | ICD-10-CM

## 2019-12-07 NOTE — Telephone Encounter (Signed)
Last check up 08/07/19

## 2019-12-07 NOTE — Telephone Encounter (Signed)
I recommend 123456, metabolic 7, lipid, liver, urine micro protein, HIV antibody per CDC protocol Diagnosis hyperlipidemia diabetes hypertension Patient states she will get these through [facility] please call her to let her know she can pick up the forms thank you

## 2019-12-07 NOTE — Telephone Encounter (Signed)
Lipid, liver, bmp, a1c done one 09/03/19

## 2019-12-07 NOTE — Telephone Encounter (Signed)
Pt has appt 12/12/2019, wonders if she needs lab work done  She has to pick up the orders & have her labs done at [place] due to her insurance (pt would like to get them done this weekend if at all possible)   Please advise & call pt

## 2019-12-08 NOTE — Telephone Encounter (Signed)
Orders put in and pt was notified on voicemail she could pick up today

## 2019-12-12 ENCOUNTER — Other Ambulatory Visit: Payer: Self-pay | Admitting: Family Medicine

## 2019-12-12 ENCOUNTER — Other Ambulatory Visit: Payer: Self-pay

## 2019-12-12 ENCOUNTER — Ambulatory Visit (INDEPENDENT_AMBULATORY_CARE_PROVIDER_SITE_OTHER): Payer: Managed Care, Other (non HMO) | Admitting: Family Medicine

## 2019-12-12 VITALS — BP 166/99 | HR 96

## 2019-12-12 DIAGNOSIS — F988 Other specified behavioral and emotional disorders with onset usually occurring in childhood and adolescence: Secondary | ICD-10-CM | POA: Diagnosis not present

## 2019-12-12 DIAGNOSIS — E781 Pure hyperglyceridemia: Secondary | ICD-10-CM | POA: Diagnosis not present

## 2019-12-12 DIAGNOSIS — E7849 Other hyperlipidemia: Secondary | ICD-10-CM | POA: Diagnosis not present

## 2019-12-12 DIAGNOSIS — E119 Type 2 diabetes mellitus without complications: Secondary | ICD-10-CM

## 2019-12-12 DIAGNOSIS — G894 Chronic pain syndrome: Secondary | ICD-10-CM

## 2019-12-12 MED ORDER — LOSARTAN POTASSIUM 100 MG PO TABS: ORAL_TABLET | ORAL | 5 refills | Status: DC

## 2019-12-12 MED ORDER — LIDOCAINE HCL 2 % EX GEL: 1.0000 "application " | CUTANEOUS | 12 refills | Status: AC | PRN

## 2019-12-12 MED ORDER — HYDROCODONE-ACETAMINOPHEN 5-325 MG PO TABS: ORAL_TABLET | ORAL | 0 refills | Status: DC

## 2019-12-12 MED ORDER — AMPHETAMINE-DEXTROAMPHET ER 30 MG PO CP24: 30.0000 mg | ORAL_CAPSULE | Freq: Every day | ORAL | 0 refills | Status: DC

## 2019-12-12 MED ORDER — AMPHETAMINE-DEXTROAMPHET ER 30 MG PO CP24: 30.0000 mg | ORAL_CAPSULE | ORAL | 0 refills | Status: DC

## 2019-12-12 MED ORDER — LIDOCAINE 5 % EX OINT: 1.0000 "application " | TOPICAL_OINTMENT | CUTANEOUS | 5 refills | Status: DC | PRN

## 2019-12-12 NOTE — Progress Notes (Signed)
Subjective:    Patient ID: Lisa Li, female    DOB: 1966-03-03, 54 y.o.   MRN: [id number]  Diabetes She presents for her follow-up diabetic visit. She has type 2 diabetes mellitus. There are no hypoglycemic associated symptoms. Pertinent negatives for hypoglycemia include no confusion or dizziness. There are no diabetic associated symptoms. Pertinent negatives for diabetes include no chest pain, no fatigue, no polydipsia, no polyphagia and no weakness. There are no hypoglycemic complications. There are no diabetic complications.   This patient was seen today for chronic pain  The medication list was reviewed and updated.   -Compliance with medication: Hydrocodone 5-325  - Number patient states they take daily: 4-5  -when was the last dose patient took? yesterday  The patient was advised the importance of maintaining medication and not using illegal substances with these.  Here for refills and follow up  The patient was educated that we can provide 3 monthly scripts for their medication, it is their responsibility to follow the instructions.  Side effects or complications from medications: none  Patient is aware that pain medications are meant to minimize the severity of the pain to allow their pain levels to improve to allow for better function. They are aware of that pain medications cannot totally remove their pain.  Due for UDT ( at least once per year) : last UDT 07/14/18   Patient was seen today for ADD checkup.  This patient does have ADD.  Patient takes medications for this.  If this does help control overall symptoms.  Please see below. -weight, vital signs reviewed.  The following items were covered. -Compliance with medication : Adderall 30 mg  -Problems with completing homework, paying attention/taking good notes in school: n/a  -grades: n/a  - Eating patterns : eating best she can with here schedule  -sleeping: sleeping ok when she can; has hectic work  schedule   -Additional issues or questions: pt needs refills on Lidocaine gel and ointment I reviewed over the patient's lab work Her urine albumin creatinine ratio was normal Sodium 134 calcium normal creatinine normal at 0.68 liver enzymes are normal lipid profile 195 total LDL 108 HDL 38 A1c 7.6 Virtual Visit via Video Note  I connected with Lisa Li on 12/12/19 at 10:30 AM EST by a video enabled telemedicine application and verified that I am speaking with the correct person using two identifiers.  Location: Patient: home Provider: office   I discussed the limitations of evaluation and management by telemedicine and the availability of in person appointments. The patient expressed understanding and agreed to proceed.  History of Present Illness:    Observations/Objective:   Assessment and Plan:   Follow Up Instructions:    I discussed the assessment and treatment plan with the patient. The patient was provided an opportunity to ask questions and all were answered. The patient agreed with the plan and demonstrated an understanding of the instructions.   The patient was advised to call back or seek an in-person evaluation if the symptoms worsen or if the condition fails to improve as anticipated.  I provided 30 minutes of non-face-to-face time during this encounter.  This includes going over her lab work with the patient plus also going over her notes and documentation plus also documentation of today's visit plus also time spent with patient         Review of Systems  Constitutional: Negative for activity change, appetite change and fatigue.  HENT: Negative for congestion and  rhinorrhea.   Respiratory: Negative for cough and shortness of breath.   Cardiovascular: Negative for chest pain and leg swelling.  Gastrointestinal: Negative for abdominal pain and diarrhea.  Endocrine: Negative for polydipsia and polyphagia.  Skin: Negative for color change.    Neurological: Negative for dizziness and weakness.  Psychiatric/Behavioral: Negative for behavioral problems and confusion.       Objective:   Physical Exam  Virtual visit unable to do exam      Assessment & Plan:  1. Controlled type 2 diabetes mellitus without complication, without long-term current use of insulin ([place]) Diabetes could be better we will go ahead with adding medication to hopefully Rybelsus is covered if not we will use Victoza  2. Hypertriglyceridemia Watch diet stay active stay on medication  3. Other hyperlipidemia Watch diet stay active stay on medication try to get LDL below 100 and and below 70 if possible  4. Attention deficit disorder, unspecified hyperactivity presence Has adult ADD benefits from medicine 3 prescription sent in  5. Chronic pain syndrome The patient was seen in followup for chronic pain. A review over at their current pain status was discussed. Drug registry was checked. Prescriptions were given. Discussion was held regarding the importance of compliance with medication as well as pain medication contract.  Time for questions regarding pain management plan occurred. Importance of regular followup visits was discussed. Patient was informed that medication may cause drowsiness and should not be combined  with other medications/alcohol or street drugs. Patient was cautioned that medication could cause drowsiness. If the patient feels medication is causing altered alertness then do not drive or operate dangerous equipment.  Pain medication pain medication takes the edge off her back pain allows her to function better would have a difficult time functioning without  Subpar blood pressure control bump up losartan to 100 mg do a in person visit in 3 to 4 weeks to check blood pressure bring her blood pressure cuff monitor with her when she comes

## 2019-12-13 ENCOUNTER — Encounter: Payer: Self-pay | Admitting: Family Medicine

## 2019-12-13 MED ORDER — RYBELSUS 3 MG PO TABS: 1.0000 | ORAL_TABLET | Freq: Every day | ORAL | 1 refills | Status: DC

## 2019-12-19 ENCOUNTER — Telehealth: Payer: Self-pay | Admitting: Family Medicine

## 2019-12-19 NOTE — Telephone Encounter (Signed)
Rybelsus 3 mg tablet has been approved form 12/15/19-06/16/20. Pt is aware. Pt has already picked the med up from pharmacy. Pt had to pay $10.

## 2019-12-20 ENCOUNTER — Other Ambulatory Visit: Payer: Self-pay

## 2019-12-20 ENCOUNTER — Encounter: Payer: Self-pay | Admitting: Family Medicine

## 2019-12-20 ENCOUNTER — Ambulatory Visit (INDEPENDENT_AMBULATORY_CARE_PROVIDER_SITE_OTHER): Payer: Managed Care, Other (non HMO) | Admitting: Family Medicine

## 2019-12-20 DIAGNOSIS — J019 Acute sinusitis, unspecified: Secondary | ICD-10-CM | POA: Diagnosis not present

## 2019-12-20 MED ORDER — AMOXICILLIN-POT CLAVULANATE 875-125 MG PO TABS: 1.0000 | ORAL_TABLET | Freq: Two times a day (BID) | ORAL | 0 refills | Status: AC

## 2019-12-20 MED ORDER — FLUCONAZOLE 150 MG PO TABS: 150.0000 mg | ORAL_TABLET | Freq: Once | ORAL | 2 refills | Status: AC

## 2019-12-20 NOTE — Progress Notes (Signed)
   Subjective:    Patient ID: Lisa Li, female    DOB: 08/12/1966, 54 y.o.   MRN: [id number]  Sinus Problem This is a new problem. The current episode started in the past 7 days. Associated symptoms include congestion, coughing, headaches and sinus pressure. (Fatigue- upper respiratory- not in chest)   Patient with head congestion drainage coughing denies wheezing difficulty breathing   Review of Systems  HENT: Positive for congestion and sinus pressure.   Respiratory: Positive for cough.   Neurological: Positive for headaches.   Virtual Visit via Video Note  I connected with Lisa Li on 12/20/19 at 11:00 AM EST by a video enabled telemedicine application and verified that I am speaking with the correct person using two identifiers.  Location: Patient: home Provider: office   I discussed the limitations of evaluation and management by telemedicine and the availability of in person appointments. The patient expressed understanding and agreed to proceed.  History of Present Illness:    Observations/Objective:   Assessment and Plan:   Follow Up Instructions:    I discussed the assessment and treatment plan with the patient. The patient was provided an opportunity to ask questions and all were answered. The patient agreed with the plan and demonstrated an understanding of the instructions.   The patient was advised to call back or seek an in-person evaluation if the symptoms worsen or if the condition fails to improve as anticipated.  I provided 18 minutes of non-face-to-face time during this encounter.        Objective:   Physical Exam   Patient had virtual visit Appears to be in no distress Atraumatic Neuro able to relate and oriented No apparent resp distress Color normal      Assessment & Plan:  Mild sinus infection Augmentin 875 twice daily for 10 days causing gastric upset Diflucan as needed Recommend Covid testing through her  workplace Work excuse will be sent  Patient also states lidocaine gel unable to get it through [place] she will call around to find a place then notify us which pharmacy to send it to

## 2019-12-26 ENCOUNTER — Encounter: Payer: Self-pay | Admitting: Family Medicine

## 2020-01-03 ENCOUNTER — Telehealth: Payer: Self-pay | Admitting: Family Medicine

## 2020-01-03 ENCOUNTER — Other Ambulatory Visit: Payer: Self-pay | Admitting: Family Medicine

## 2020-01-03 ENCOUNTER — Encounter: Payer: Self-pay | Admitting: Family Medicine

## 2020-01-03 MED ORDER — ONDANSETRON 8 MG PO TBDP: ORAL_TABLET | ORAL | 1 refills | Status: DC

## 2020-01-03 MED ORDER — FLUCONAZOLE 150 MG PO TABS: ORAL_TABLET | ORAL | 0 refills | Status: DC

## 2020-01-03 NOTE — Addendum Note (Signed)
Addended by: Patsy Lager on: 01/03/2020 02:49 PM   Modules accepted: Orders

## 2020-01-03 NOTE — Telephone Encounter (Signed)
Nurses Please put Rybelsus on her allergy list although not technically an allergy of nausea with the medicine would preclude its use  May send in Zofran 8 mg 1 taken 3 times daily as needed nausea, #15 with 1 refill Also send in Diflucan 200 mg 1 daily for 7 days Forward message to the patient via [place] you are having such symptoms.  More than likely the nausea related to the new medication.  I agree with you stopping the medicine.  Hopefully Zofran will help take the edge off the nausea.  The nausea should go away over the course of the next 7 to 10 days.  Diflucan 1 daily for the next 7 days should help with the perianal rash  Check your sugars once to twice daily over the course the next several days and send [place] some readings next week-will need to come up with a secondary plan depending on the results of the sugars  Thanks-Dr. Nicki Reaper

## 2020-01-03 NOTE — Telephone Encounter (Signed)
Please send work excuse for Thursday Friday Saturday Sunday via MyChart (Patient having sickness and was recommended for her to be out through the weekend return to work on Monday)

## 2020-01-04 ENCOUNTER — Encounter: Payer: Self-pay | Admitting: Family Medicine

## 2020-01-22 ENCOUNTER — Telehealth: Payer: Self-pay | Admitting: Family Medicine

## 2020-01-22 NOTE — Telephone Encounter (Signed)
Left message to return call 

## 2020-01-22 NOTE — Telephone Encounter (Signed)
Patient cancel appointment for tomorrow because she stopped taking medication that was prescribe and if you need to see her later . Please advise She schedule  Her 3 month follow up for June

## 2020-01-23 ENCOUNTER — Ambulatory Visit: Payer: Managed Care, Other (non HMO) | Admitting: Family Medicine

## 2020-01-23 NOTE — Telephone Encounter (Signed)
If she needs anything we will be happy to see her sooner otherwise if she prefers to be seen in June that is fine

## 2020-02-09 ENCOUNTER — Other Ambulatory Visit: Payer: Self-pay | Admitting: Family Medicine

## 2020-02-26 DIAGNOSIS — Z029 Encounter for administrative examinations, unspecified: Secondary | ICD-10-CM

## 2020-03-15 ENCOUNTER — Encounter: Payer: Self-pay | Admitting: Obstetrics & Gynecology

## 2020-03-15 ENCOUNTER — Other Ambulatory Visit: Payer: Self-pay

## 2020-03-15 ENCOUNTER — Ambulatory Visit: Payer: Managed Care, Other (non HMO) | Admitting: Obstetrics & Gynecology

## 2020-03-15 VITALS — BP 130/68 | HR 70 | Temp 97.5°F | Resp 16 | Ht 63.5 in | Wt 176.0 lb

## 2020-03-15 DIAGNOSIS — E119 Type 2 diabetes mellitus without complications: Secondary | ICD-10-CM

## 2020-03-15 DIAGNOSIS — Z01419 Encounter for gynecological examination (general) (routine) without abnormal findings: Secondary | ICD-10-CM | POA: Diagnosis not present

## 2020-03-15 DIAGNOSIS — R931 Abnormal findings on diagnostic imaging of heart and coronary circulation: Secondary | ICD-10-CM

## 2020-03-15 MED ORDER — ESTRADIOL 0.1 MG/24HR TD PTTW: 1.0000 | MEDICATED_PATCH | TRANSDERMAL | 4 refills | Status: DC

## 2020-03-15 NOTE — Progress Notes (Signed)
54 y.o. G0P0 Single White or Caucasian female here for annual exam.  Had carpel tunnel release in September last year.  Hand had gotten where her hand was numb all of the time.  It is better.  Has concerns about her diabetes.  labe hbA1C was > 8.  Endocrinology consultation recommended.  She is open to this.  Needs to seen [facility] provider.  Also, had coronary calcium score obtained 03/2019.  Saw Dr. Barton Dubois.  Coronary calcium primarily in LAD.  Medications changes.  Better diabetes control recommended.  Stress treadmill obtained 08/15/2019.  PCP:  Dr. Wolfgang Phoenix.    Patient's last menstrual period was 05/31/2013.          Sexually active: No The current method of family planning is status post hysterectomy.    Exercising: No.  Smoker:  no  Health Maintenance: Pap:  2013 neg History of abnormal Pap:  no MMG:  02-26-2020 birads 1:neg Colonoscopy:  08-05-16 normal f/u 10 yrs BMD:   2011 TDaP:  2013 Pneumonia vaccine(s):  2015 Shingrix:   Discussed today with pt Hep C testing: 07-14-16 neg Screening Labs: with Dr. Wolfgang Phoenix   reports that she has never smoked. She has never used smokeless tobacco. She reports that she does not drink alcohol or use drugs.  Past Medical History:  Diagnosis Date  . Carpal tunnel syndrome on both sides 06/13/2019  . Chronic pain 05/09/2019  . Coronary artery disease 05/09/2019   Abnormal cardiac calcium score with lesion  . Frequent UTI   . Kidney stone   . Meniere's disease 2017   Dr. Benjamine Mola   . MGUS (monoclonal gammopathy of unknown significance)   . Migraine headache   . Myalgia    Chronic  . Pre-diabetes     Past Surgical History:  Procedure Laterality Date  . APPENDECTOMY    . CARPAL TUNNEL RELEASE Right 06/13/2019  . CHOLECYSTECTOMY    . COLONOSCOPY W/ ENDOSCOPIC [place]  09/27/06   02/2011   . CYSTOSCOPY N/A 07/03/2013   Procedure: CYSTOSCOPY;  Surgeon: Lyman Speller, MD;  Location: [facility];  Service: Gynecology;  Laterality: N/A;  .  ESOPHAGOGASTRODUODENOSCOPY    . ROBOTIC ASSISTED TOTAL HYSTERECTOMY WITH BILATERAL SALPINGO OOPHERECTOMY Bilateral 07/03/2013   Procedure: ROBOTIC ASSISTED TOTAL HYSTERECTOMY WITH BILATERAL SALPINGO OOPHORECTOMY;  Surgeon: Lyman Speller, MD;  Location: [facility];  Service: Gynecology;  Laterality: Bilateral;  . TONSILLECTOMY  1973    Current Outpatient Medications  Medication Sig Dispense Refill  . amphetamine-dextroamphetamine (ADDERALL XR) 30 MG 24 hr capsule Take 1 capsule (30 mg total) by mouth every morning. 30 capsule 0  . betamethasone dipropionate (DIPROLENE) 0.05 % cream Apply topically 2 (two) times daily. 60 g 5  . chlorzoxazone (PARAFON FORTE DSC) 500 MG tablet Take 1 tablet (500 mg total) by mouth 3 (three) times daily as needed for muscle spasms. 45 tablet 4  . diazepam (VALIUM) 5 MG tablet TAKE 1 TABLET AT BEDTIME AS NEEDED. 30 tablet 5  . diclofenac sodium (VOLTAREN) 1 % GEL Apply 4 g topically 4 (four) times daily. 1 Tube 10  . docusate sodium (COLACE) 100 MG capsule Take 100 mg by mouth as needed.     Marland Kitchen estradiol (VIVELLE-DOT) 0.1 MG/24HR patch PLACE 1 PATCH ONTO THE SKIN 2 TIMES A WEEK 24 patch 3  . FLUoxetine (PROZAC) 10 MG tablet TAKE (1) TABLET BY MOUTH ONCE DAILY. 30 tablet 0  . gabapentin (NEURONTIN) 100 MG capsule as needed.    Marland Kitchen HYDROcodone-acetaminophen (  NORCO/VICODIN) 5-325 MG tablet Take one every 4 hours prn pain. Maximum 5 per day 150 tablet 0  . lidocaine (XYLOCAINE JELLY) 2 % jelly Apply 1 application topically as needed. Apply to leg 30 mL 12  . lidocaine (XYLOCAINE) 5 % ointment Apply 1 application topically as needed. Apply to toe 35.44 g 5  . lidocaine-prilocaine (EMLA) cream APPLY TO AFFECTED AREAS2TWICE DAILY AS NEEDED.    Marland Kitchen loratadine (CLARITIN) 10 MG tablet Take 10 mg by mouth daily.    Marland Kitchen losartan (COZAAR) 100 MG tablet Take one tablet po each day 30 tablet 5  . meloxicam (MOBIC) 15 MG tablet TAKE 1 TABLET ONCE DAILY. DO NOT USE ON THE SAME DAY AS  NAPROXEN. 30 tablet 3  . metFORMIN (GLUCOPHAGE-XR) 500 MG 24 hr tablet TAKE (1) TABLET BY MOUTH EACH MORNING. 30 tablet 0  . omeprazole (PRILOSEC) 40 MG capsule TAKE ONE CAPSULE BY MOUTH ONCE DAILY. 30 capsule 12  . ondansetron (ZOFRAN ODT) 8 MG disintegrating tablet Take 1 tablet 3 times daily as needed for nausea 15 tablet 1  . triamcinolone (NASACORT) 55 MCG/ACT AERO nasal inhaler USE 1 SPRAY IN EACH NOSTRIL DAILY. 1 Inhaler 5  . triamcinolone cream (KENALOG) 0.1 % Apply to affected area twice daily as needed 45 g 4   No current facility-administered medications for this visit.    Family History  Problem Relation Age of Onset  . COPD Mother        Deceased, 56  . Prostate cancer Father        Deceased, 66  . Emphysema Sister   . Healthy Brother   . Liver cancer Other        and pancreatic cancer-paternal cousin  . Liver cancer Other        paternal cousin  . Breast cancer Cousin     Review of Systems  Constitutional: Negative.   HENT: Negative.   Eyes: Negative.   Respiratory: Negative.   Cardiovascular: Negative.   Gastrointestinal: Negative.   Endocrine: Negative.   Genitourinary:       Pain in abdominal area occ  Musculoskeletal: Negative.   Skin: Negative.   Allergic/Immunologic: Negative.   Neurological: Negative.   Hematological: Negative.   Psychiatric/Behavioral: Negative.     Exam:   Temp (!) 97.5 F (36.4 C) (Skin)   Ht 5' 3.5" (1.613 m)   Wt 176 lb (79.8 kg)   LMP 05/31/2013   BMI 30.69 kg/m   Height: 5' 3.5" (161.3 cm)  General appearance: alert, cooperative and appears stated age Head: Normocephalic, without obvious abnormality, atraumatic Neck: no adenopathy, supple, symmetrical, trachea midline and thyroid normal to inspection and palpation Lungs: clear to auscultation bilaterally Breasts: normal appearance, no masses or tenderness Heart: regular rate and rhythm Abdomen: soft, non-tender; bowel sounds normal; no masses,  no  organomegaly Extremities: extremities normal, atraumatic, no cyanosis or edema Skin: Skin color, texture, turgor normal. No rashes or lesions Lymph nodes: Cervical, supraclavicular, and axillary nodes normal. No abnormal inguinal nodes palpated Neurologic: Grossly normal   Pelvic: External genitalia:  no lesions              Urethra:  normal appearing urethra with no masses, tenderness or lesions              Bartholins and Skenes: normal                 Vagina: normal appearing vagina with normal color and discharge, no lesions  Cervix: absent              Pap taken: No. Bimanual Exam:  Uterus:  uterus absent              Adnexa: no mass, fullness, tenderness               Rectovaginal: Confirms               Anus:  normal sphincter tone, no lesions  Chaperone, Terence Lux, CMA, was present for exam.  A:  Well Woman with normal exam PMP, on HRT Type 2 DM ADH H/o migraines GERD, gastric polyps, IBS, fatty liver disease, h/o eleated liver enzymes H/o robotic TLH/BSO, cysto 9/14 H/o coronary calcium in LAD, seen by Dr. Mauricio Po  P:   Mammogram guidelines reviewed.  Doing 3D due to breast density. pap smear not indicated RF for vivelle dot 0.1mg  patches twice weekly.  #8/12RF Declines shingrix vaccination due to having Covid vaccination  Referral to endocrinology will be placed return annually or prn

## 2020-03-19 ENCOUNTER — Telehealth: Payer: Self-pay

## 2020-03-19 NOTE — Telephone Encounter (Signed)
Left message for patient to call back  

## 2020-03-19 NOTE — Telephone Encounter (Signed)
-----   Message from Megan Salon, MD sent at 03/18/2020 11:26 PM EDT ----- Regarding: referrals from [facility], Can you call Ms. Stokes.  On Friday she wanted me to refer her to endocrinology and cardiology.  I've placed the referral to Dr. Starleen Blue who is in the [place] office.  I reached out to a friend who is at [facility] in endocrinology for names.  Ms. Sherrie Sport needs [facility] providers per her  insurance.  She saw Dr. Mauricio Po, cardiology, last year due to coronary calcium and has stress treadmill.  As she has a [facility] cardiologist, she needs to plan follow up with him and not a new provider.  Thanks.  Vinnie Level

## 2020-03-22 NOTE — Telephone Encounter (Signed)
Left message for call back.

## 2020-03-22 NOTE — Telephone Encounter (Signed)
Patient notified

## 2020-03-25 ENCOUNTER — Ambulatory Visit: Payer: Managed Care, Other (non HMO) | Admitting: Family Medicine

## 2020-03-25 ENCOUNTER — Other Ambulatory Visit: Payer: Self-pay | Admitting: Family Medicine

## 2020-03-25 ENCOUNTER — Encounter: Payer: Self-pay | Admitting: Family Medicine

## 2020-03-25 ENCOUNTER — Other Ambulatory Visit: Payer: Self-pay

## 2020-03-25 VITALS — BP 136/84 | Temp 97.6°F | Ht 63.5 in | Wt 178.4 lb

## 2020-03-25 DIAGNOSIS — I2510 Atherosclerotic heart disease of native coronary artery without angina pectoris: Secondary | ICD-10-CM | POA: Diagnosis not present

## 2020-03-25 DIAGNOSIS — E785 Hyperlipidemia, unspecified: Secondary | ICD-10-CM

## 2020-03-25 DIAGNOSIS — I889 Nonspecific lymphadenitis, unspecified: Secondary | ICD-10-CM

## 2020-03-25 DIAGNOSIS — F988 Other specified behavioral and emotional disorders with onset usually occurring in childhood and adolescence: Secondary | ICD-10-CM

## 2020-03-25 DIAGNOSIS — Z79891 Long term (current) use of opiate analgesic: Secondary | ICD-10-CM | POA: Insufficient documentation

## 2020-03-25 DIAGNOSIS — R079 Chest pain, unspecified: Secondary | ICD-10-CM | POA: Diagnosis not present

## 2020-03-25 DIAGNOSIS — E1169 Type 2 diabetes mellitus with other specified complication: Secondary | ICD-10-CM | POA: Diagnosis not present

## 2020-03-25 DIAGNOSIS — G894 Chronic pain syndrome: Secondary | ICD-10-CM

## 2020-03-25 MED ORDER — HYDROCODONE-ACETAMINOPHEN 5-325 MG PO TABS: ORAL_TABLET | ORAL | 0 refills | Status: DC

## 2020-03-25 MED ORDER — AZITHROMYCIN 250 MG PO TABS
ORAL_TABLET | ORAL | 0 refills | Status: DC
Start: 2020-03-25 — End: 2020-06-25

## 2020-03-25 MED ORDER — AMPHETAMINE-DEXTROAMPHET ER 30 MG PO CP24: 30.0000 mg | ORAL_CAPSULE | Freq: Every day | ORAL | 0 refills | Status: DC

## 2020-03-25 MED ORDER — METFORMIN HCL ER 500 MG PO TB24: ORAL_TABLET | ORAL | 5 refills | Status: DC

## 2020-03-25 MED ORDER — AMPHETAMINE-DEXTROAMPHET ER 30 MG PO CP24: 30.0000 mg | ORAL_CAPSULE | ORAL | 0 refills | Status: DC

## 2020-03-25 NOTE — Progress Notes (Signed)
Subjective:    Patient ID: Lisa Li, female    DOB: [birth year], 54 y.o.   MRN: [id number]  HPI   This patient was seen today for chronic pain  The medication list was reviewed and updated.   -Compliance with medication: yes  - Number patient states they take daily: 4-5 a day  -when was the last dose patient took? today  The patient was advised the importance of maintaining medication and not using illegal substances with these.  Here for refills and follow up  The patient was educated that we can provide 3 monthly scripts for their medication, it is their responsibility to follow the instructions.  Side effects or complications from medications: none  Patient is aware that pain medications are meant to minimize the severity of the pain to allow their pain levels to improve to allow for better function. They are aware of that pain medications cannot totally remove their pain.  Due for UDT ( at least once per year) : 03/25/20  Patient also reports left ear pain since Thursday Long-term current use of opiate analgesic - Plan: ToxASSURE Select 13 (MW), Urine  Coronary artery disease involving native heart without angina pectoris, unspecified vessel or lesion type - Plan: Ambulatory referral to Cardiology  Hyperlipidemia associated with type 2 diabetes mellitus ([place])  Chest pain, unspecified type - Plan: Ambulatory referral to Cardiology  Attention deficit disorder, unspecified hyperactivity presence  Chronic pain syndrome  Cervical lymphadenitis   Ear pain that she is having is mainly on the side of her neck she denies any mass but she relates tenderness denies any head congestion drainage She is having some shortness of breath with activity and had a positive calcium coronary artery test several months ago but a normal stress echo Patient also has diabetes is not under good control and is scheduled to see endocrinology in the near future.  Should overall do well with  this. Review of Systems  Constitutional: Negative for activity change, appetite change and fatigue.  HENT: Negative for congestion and rhinorrhea.   Respiratory: Negative for cough and shortness of breath.   Cardiovascular: Negative for chest pain and leg swelling.  Gastrointestinal: Negative for abdominal pain and diarrhea.  Endocrine: Negative for polydipsia and polyphagia.  Skin: Negative for color change.  Neurological: Negative for dizziness and weakness.  Psychiatric/Behavioral: Negative for behavioral problems and confusion.       Objective:   Physical Exam Vitals reviewed.  Constitutional:      General: She is not in acute distress. HENT:     Head: Normocephalic and atraumatic.  Eyes:     General:        Right eye: No discharge.        Left eye: No discharge.  Neck:     Trachea: No tracheal deviation.  Cardiovascular:     Rate and Rhythm: Normal rate and regular rhythm.     Heart sounds: Normal heart sounds. No murmur heard.   Pulmonary:     Effort: Pulmonary effort is normal. No respiratory distress.     Breath sounds: Normal breath sounds.  Lymphadenopathy:     Cervical: No cervical adenopathy.  Skin:    General: Skin is warm and dry.  Neurological:     Mental Status: She is alert.     Coordination: Coordination normal.  Psychiatric:        Behavior: Behavior normal.           Assessment & Plan:  1.  Long-term current use of opiate analgesic Patient states pain medicine does take the edge off her pain allows her to be more functional.  We will continue the medication drug registry was checked - ToxASSURE Select 13 (MW), Urine  2. Coronary artery disease involving native heart without angina pectoris, unspecified vessel or lesion type Patient had a abnormal calcium score although it was not severe she is now having some intermittent chest pains along with DOE with activity.  As a result of this this raises in the question the possibility of underlying  heart disease referral back to cardiology possibility of doing a catheterization - Ambulatory referral to Cardiology  3. Hyperlipidemia associated with type 2 diabetes mellitus ([place]) Patient does not tolerate statins.  We will try Repatha.  It was recommended by cardiology.  Patient does have coronary artery disease.  She is willing to try the medicine  4. Chest pain, unspecified type Please see above.  We will refer her to cardiology for further evaluation possible catheterization - Ambulatory referral to Cardiology  5. Attention deficit disorder, unspecified hyperactivity presence Adult ADD does well on the medicines does not abuse medicine 3 prescription sent in drug registry checked urine drug screen ordered  6. Chronic pain syndrome The patient was seen in followup for chronic pain. A review over at their current pain status was discussed. Drug registry was checked. Prescriptions were given.  Regular follow-up recommended. Discussion was held regarding the importance of compliance with medication as well as pain medication contract.  Patient was informed that medication may cause drowsiness and should not be combined  with other medications/alcohol or street drugs. If the patient feels medication is causing altered alertness then do not drive or operate dangerous equipment.  Drug registry checked urine drug screen pending 3 prescription sent in  7. Cervical lymphadenitis Zithromax should clear up if ongoing trouble notify us  Patient will also be seen endocrinology for her diabetes.  She did not tolerate Ryselbus increase Metformin take 2 daily

## 2020-03-27 ENCOUNTER — Ambulatory Visit: Payer: Managed Care, Other (non HMO) | Admitting: Family Medicine

## 2020-03-28 LAB — TOXASSURE SELECT 13 (MW), URINE

## 2020-04-11 ENCOUNTER — Telehealth: Payer: Self-pay | Admitting: Obstetrics & Gynecology

## 2020-04-11 NOTE — Telephone Encounter (Signed)
Call placed to follow up on endocrinology referral.

## 2020-04-15 ENCOUNTER — Other Ambulatory Visit: Payer: Self-pay | Admitting: Family Medicine

## 2020-04-17 NOTE — Telephone Encounter (Signed)
Call placed to follow up on referral.

## 2020-04-24 ENCOUNTER — Other Ambulatory Visit: Payer: Self-pay | Admitting: Family Medicine

## 2020-05-14 ENCOUNTER — Other Ambulatory Visit: Payer: Self-pay | Admitting: Family Medicine

## 2020-05-28 ENCOUNTER — Telehealth: Payer: Self-pay | Admitting: Obstetrics & Gynecology

## 2020-05-28 NOTE — Telephone Encounter (Signed)
Per Ramiro Harvest at [facility] -they have called the patient multiple times for scheduling and no response. I have called the patient twice no response. Per Dr. Sabra Heck ok to close the referral in Epic.

## 2020-06-06 ENCOUNTER — Other Ambulatory Visit: Payer: Self-pay | Admitting: Family Medicine

## 2020-06-19 ENCOUNTER — Telehealth: Payer: Self-pay | Admitting: Family Medicine

## 2020-06-19 DIAGNOSIS — Z114 Encounter for screening for human immunodeficiency virus [HIV]: Secondary | ICD-10-CM

## 2020-06-19 DIAGNOSIS — I10 Essential (primary) hypertension: Secondary | ICD-10-CM

## 2020-06-19 DIAGNOSIS — E119 Type 2 diabetes mellitus without complications: Secondary | ICD-10-CM

## 2020-06-19 DIAGNOSIS — Z79899 Other long term (current) drug therapy: Secondary | ICD-10-CM

## 2020-06-19 DIAGNOSIS — E1169 Type 2 diabetes mellitus with other specified complication: Secondary | ICD-10-CM

## 2020-06-19 NOTE — Telephone Encounter (Signed)
Labs ordered back in February but were not completed HIV, URINE MICRO,BMET, A1C,HEPATIC AND LIPID.Please advise. Thank you

## 2020-06-19 NOTE — Telephone Encounter (Signed)
Patient is requesting labs done for follow up visit next week.Her labs in system has expired.

## 2020-06-19 NOTE — Telephone Encounter (Signed)
Go ahead and reorder all of the labs from February for the patient to do

## 2020-06-20 NOTE — Telephone Encounter (Signed)
Lab orders placed and pt is aware. Pt will come by office and pick up a copy.

## 2020-06-25 ENCOUNTER — Encounter: Payer: Self-pay | Admitting: Family Medicine

## 2020-06-25 ENCOUNTER — Other Ambulatory Visit: Payer: Self-pay

## 2020-06-25 ENCOUNTER — Ambulatory Visit: Payer: Managed Care, Other (non HMO) | Admitting: Family Medicine

## 2020-06-25 VITALS — BP 130/84 | HR 104 | Temp 96.1°F | Wt 175.6 lb

## 2020-06-25 DIAGNOSIS — Z79891 Long term (current) use of opiate analgesic: Secondary | ICD-10-CM | POA: Diagnosis not present

## 2020-06-25 DIAGNOSIS — I2510 Atherosclerotic heart disease of native coronary artery without angina pectoris: Secondary | ICD-10-CM | POA: Diagnosis not present

## 2020-06-25 DIAGNOSIS — R5383 Other fatigue: Secondary | ICD-10-CM

## 2020-06-25 DIAGNOSIS — E1169 Type 2 diabetes mellitus with other specified complication: Secondary | ICD-10-CM | POA: Diagnosis not present

## 2020-06-25 DIAGNOSIS — E785 Hyperlipidemia, unspecified: Secondary | ICD-10-CM

## 2020-06-25 DIAGNOSIS — I10 Essential (primary) hypertension: Secondary | ICD-10-CM

## 2020-06-25 MED ORDER — AMPHETAMINE-DEXTROAMPHET ER 30 MG PO CP24
30.0000 mg | ORAL_CAPSULE | ORAL | 0 refills | Status: DC
Start: 2020-06-25 — End: 2020-10-10

## 2020-06-25 MED ORDER — OZEMPIC (0.25 OR 0.5 MG/DOSE) 2 MG/1.5ML ~~LOC~~ SOPN: PEN_INJECTOR | SUBCUTANEOUS | 1 refills | Status: DC

## 2020-06-25 MED ORDER — ONDANSETRON 8 MG PO TBDP: ORAL_TABLET | ORAL | 2 refills | Status: DC

## 2020-06-25 MED ORDER — AMPHETAMINE-DEXTROAMPHET ER 30 MG PO CP24
30.0000 mg | ORAL_CAPSULE | Freq: Every day | ORAL | 0 refills | Status: DC
Start: 2020-06-25 — End: 2020-10-10

## 2020-06-25 MED ORDER — HYDROCODONE-ACETAMINOPHEN 5-325 MG PO TABS
ORAL_TABLET | ORAL | 0 refills | Status: DC
Start: 2020-06-25 — End: 2020-10-10

## 2020-06-25 MED ORDER — ONDANSETRON HCL 8 MG PO TABS: ORAL_TABLET | ORAL | 2 refills | Status: DC

## 2020-06-25 MED ORDER — PROPRANOLOL HCL 10 MG PO TABS: ORAL_TABLET | ORAL | 4 refills | Status: DC

## 2020-06-25 MED ORDER — NYSTATIN 100000 UNIT/ML MT SUSP: OROMUCOSAL | 0 refills | Status: AC

## 2020-06-25 MED ORDER — LIDOCAINE HCL 2 % EX GEL
1.0000 | CUTANEOUS | 12 refills | Status: DC | PRN
Start: 2020-06-25 — End: 2023-02-24

## 2020-06-25 NOTE — Progress Notes (Signed)
Subjective:    Patient ID: Lisa Li, female    DOB: [birth year], 54 y.o.   MRN: [id number]  HPI This patient was seen today for chronic pain  The medication list was reviewed and updated.   -Compliance with medication: Hydrocodone 5-325  - Number patient states they take daily: up to 4; depends on the day   -when was the last dose patient took? This morning   The patient was advised the importance of maintaining medication and not using illegal substances with these.  Here for refills and follow up  The patient was educated that we can provide 3 monthly scripts for their medication, it is their responsibility to follow the instructions.  Side effects or complications from medications: none  Patient is aware that pain medications are meant to minimize the severity of the pain to allow their pain levels to improve to allow for better function. They are aware of that pain medications cannot totally remove their pain.  Due for UDT ( at least once per year) : completed earlier this year  Scale of 1 to 10 ( 1 is least 10 is most) Your pain level without the medicine: 6 Your pain level with medication :2  Scale 1 to 10 ( 1-helps very little, 10 helps very well) How well does your pain medication reduce your pain so you can function better through out the day? 9  Patient was seen today for ADD checkup.  This patient does have ADD.  Patient takes medications for this.  If this does help control overall symptoms.  Please see below. -weight, vital signs reviewed.  The following items were covered. -Compliance with medication : Adderall XR 30 mg each morning  -Problems with completing homework, paying attention/taking good notes in school: n/a  -grades: n/a  - Eating patterns : eating well  -sleeping: sleeping to [place]  -Additional issues or questions: pt had Cardiac study and FFR study done at [facility]. Also having weird symptoms going on with her such as mouth  being raw, nagging headache for 2 weeks, upper teeth pain. Pt also would like to speak with provider about new cream for muscle pain.  Patient relates at times her mouth feels raw.  She also relates some intermittent soreness in her jaw as well as a nagging headache that comes and goes denies any numbness or tingling.  Patient also requesting refill of lidocaine gel     Review of Systems  Constitutional: Negative for activity change, appetite change and fatigue.  HENT: Negative for congestion and rhinorrhea.   Respiratory: Negative for cough and shortness of breath.   Cardiovascular: Negative for chest pain and leg swelling.  Gastrointestinal: Negative for abdominal pain and diarrhea.  Endocrine: Negative for polydipsia and polyphagia.  Skin: Negative for color change.  Neurological: Negative for dizziness and weakness.  Psychiatric/Behavioral: Negative for behavioral problems and confusion.       Objective:   Physical Exam Vitals reviewed.  Constitutional:      General: She is not in acute distress. HENT:     Head: Normocephalic and atraumatic.  Eyes:     General:        Right eye: No discharge.        Left eye: No discharge.  Neck:     Trachea: No tracheal deviation.  Cardiovascular:     Rate and Rhythm: Normal rate and regular rhythm.     Heart sounds: Normal heart sounds. No murmur heard.   Pulmonary:  Effort: Pulmonary effort is normal. No respiratory distress.     Breath sounds: Normal breath sounds.  Lymphadenopathy:     Cervical: No cervical adenopathy.  Skin:    General: Skin is warm and dry.  Neurological:     Mental Status: She is alert.     Coordination: Coordination normal.  Psychiatric:        Behavior: Behavior normal.           Assessment & Plan:  1. Coronary artery disease involving native coronary artery of native heart without angina pectoris Patient has underlying history of a 70% blockage of the LAD.  Being managed by medicines.   Recently cardiologist started diltiazem to try to help with this but patient had problems with a rash as well as some swelling in the ankles.  We will try low-dose propranolol.  1 daily.  She will monitor her heart rate and let us know.  2. Essential hypertension Blood pressure fair control propranolol will help some continue current measures healthy diet recommended to do my salt - TSH - B12  3. Hyperlipidemia associated with type 2 diabetes mellitus ([place]) Currently on Zetia her insurance company is making her take this for 2 to 3 months before they will approve Repatha should be noted that it is medically indicated for the patient to be on Repatha (unfortunately a hoop the insurance company is making her jump through - TSH - B12  4. Long-term current use of opiate analgesic The patient was seen in followup for chronic pain. A review over at their current pain status was discussed. Drug registry was checked. Prescriptions were given.  Regular follow-up recommended. Discussion was held regarding the importance of compliance with medication as well as pain medication contract.  Patient was informed that medication may cause drowsiness and should not be combined  with other medications/alcohol or street drugs. If the patient feels medication is causing altered alertness then do not drive or operate dangerous equipment.  Drug registry checked 3 scripts given  5. 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  Follow-up 3 months medication does help her she denies abusing  Patient with significant diabetes not under good control we talked about very importance of healthy diet regular activity we will add Ozempic she did have problems with nausea with utilizing  Rybelsus if she tolerates Ozempic we will gradually titrate up the dose  Follow-up 3 months

## 2020-06-28 LAB — SARS-COV-2, NAA 2 DAY TAT

## 2020-06-28 LAB — NOVEL CORONAVIRUS, NAA: SARS-CoV-2, NAA: NOT DETECTED

## 2020-07-09 ENCOUNTER — Other Ambulatory Visit: Payer: Self-pay | Admitting: Family Medicine

## 2020-07-10 ENCOUNTER — Telehealth: Payer: Self-pay | Admitting: Family Medicine

## 2020-07-10 ENCOUNTER — Telehealth: Payer: Self-pay | Admitting: *Deleted

## 2020-07-10 NOTE — Telephone Encounter (Signed)
Ozempic approved by insurance. Patient aware.

## 2020-07-10 NOTE — Telephone Encounter (Signed)
PA for Ozempic has been approved for 07/10/20-01/06/21. Pt has been notified.

## 2020-07-10 NOTE — Telephone Encounter (Addendum)
Patient called to check on prior approval for Ozempic- additional questions were required for the prior approval- questions completed -await prior authorization.

## 2020-07-15 ENCOUNTER — Other Ambulatory Visit: Payer: Self-pay | Admitting: Family Medicine

## 2020-08-16 ENCOUNTER — Other Ambulatory Visit: Payer: Self-pay | Admitting: Family Medicine

## 2020-08-23 ENCOUNTER — Encounter: Payer: Self-pay | Admitting: Family Medicine

## 2020-08-26 NOTE — Telephone Encounter (Signed)
Nurses go ahead and write out for 2 prescriptions regarding the freestyle libre Patient is a type II diabetic on medications with frequent need for checking glucose readings  Please assist Lisa Li with getting what she needs thank you

## 2020-08-27 NOTE — Telephone Encounter (Signed)
Awaiting md sign.

## 2020-09-12 ENCOUNTER — Telehealth: Payer: Self-pay | Admitting: Family Medicine

## 2020-09-12 ENCOUNTER — Other Ambulatory Visit: Payer: Self-pay | Admitting: Family Medicine

## 2020-09-12 MED ORDER — FLUOXETINE HCL 10 MG PO TABS
10.0000 mg | ORAL_TABLET | Freq: Every day | ORAL | 0 refills | Status: DC
Start: 2020-09-12 — End: 2020-10-14

## 2020-09-12 NOTE — Addendum Note (Signed)
Addended by: Erven Colla on: 09/12/2020 04:50 PM   Modules accepted: Orders

## 2020-09-12 NOTE — Telephone Encounter (Signed)
Pt needing refill on Fluoxetine 10 mg tablet sent to [facility]. Please advise. Thank you (Pt would like to get this sent in sooner rather than later because she is trying to coordinate picking up her meds. Please advise. Thank you!

## 2020-09-24 ENCOUNTER — Ambulatory Visit: Payer: Managed Care, Other (non HMO) | Admitting: Family Medicine

## 2020-10-03 ENCOUNTER — Telehealth: Payer: Self-pay | Admitting: Family Medicine

## 2020-10-03 DIAGNOSIS — I10 Essential (primary) hypertension: Secondary | ICD-10-CM

## 2020-10-03 DIAGNOSIS — Z79899 Other long term (current) drug therapy: Secondary | ICD-10-CM

## 2020-10-03 DIAGNOSIS — E1169 Type 2 diabetes mellitus with other specified complication: Secondary | ICD-10-CM

## 2020-10-03 NOTE — Telephone Encounter (Signed)
Orders put in and pt was notified. She requested them be faxed to [phone] and I did fax them.

## 2020-10-03 NOTE — Telephone Encounter (Signed)
A1c, lipid, liver, metabolic 7 

## 2020-10-03 NOTE — Telephone Encounter (Signed)
Patient has appointment on 12/30 and needing labs done

## 2020-10-10 ENCOUNTER — Ambulatory Visit: Payer: Managed Care, Other (non HMO) | Admitting: Family Medicine

## 2020-10-10 ENCOUNTER — Encounter: Payer: Self-pay | Admitting: Family Medicine

## 2020-10-10 ENCOUNTER — Other Ambulatory Visit: Payer: Self-pay

## 2020-10-10 VITALS — BP 122/78 | Temp 98.2°F | Ht 63.5 in | Wt 181.2 lb

## 2020-10-10 DIAGNOSIS — T466X5A Adverse effect of antihyperlipidemic and antiarteriosclerotic drugs, initial encounter: Secondary | ICD-10-CM

## 2020-10-10 DIAGNOSIS — E7849 Other hyperlipidemia: Secondary | ICD-10-CM | POA: Diagnosis not present

## 2020-10-10 DIAGNOSIS — F988 Other specified behavioral and emotional disorders with onset usually occurring in childhood and adolescence: Secondary | ICD-10-CM | POA: Diagnosis not present

## 2020-10-10 DIAGNOSIS — E119 Type 2 diabetes mellitus without complications: Secondary | ICD-10-CM

## 2020-10-10 DIAGNOSIS — I10 Essential (primary) hypertension: Secondary | ICD-10-CM | POA: Diagnosis not present

## 2020-10-10 DIAGNOSIS — G894 Chronic pain syndrome: Secondary | ICD-10-CM

## 2020-10-10 DIAGNOSIS — J019 Acute sinusitis, unspecified: Secondary | ICD-10-CM

## 2020-10-10 DIAGNOSIS — M791 Myalgia, unspecified site: Secondary | ICD-10-CM

## 2020-10-10 MED ORDER — HYDROCODONE-ACETAMINOPHEN 5-325 MG PO TABS: ORAL_TABLET | ORAL | 0 refills | Status: DC

## 2020-10-10 MED ORDER — AMPHETAMINE-DEXTROAMPHET ER 30 MG PO CP24: 30.0000 mg | ORAL_CAPSULE | Freq: Every day | ORAL | 0 refills | Status: DC

## 2020-10-10 MED ORDER — AMOXICILLIN 500 MG PO CAPS: 500.0000 mg | ORAL_CAPSULE | Freq: Three times a day (TID) | ORAL | 0 refills | Status: DC

## 2020-10-10 MED ORDER — FLUCONAZOLE 150 MG PO TABS: 150.0000 mg | ORAL_TABLET | Freq: Once | ORAL | 1 refills | Status: AC

## 2020-10-10 MED ORDER — AMPHETAMINE-DEXTROAMPHET ER 30 MG PO CP24
30.0000 mg | ORAL_CAPSULE | Freq: Every day | ORAL | 0 refills | Status: DC
Start: 2020-10-10 — End: 2021-01-03

## 2020-10-10 MED ORDER — AMPHETAMINE-DEXTROAMPHET ER 30 MG PO CP24: 30.0000 mg | ORAL_CAPSULE | ORAL | 0 refills | Status: DC

## 2020-10-10 MED ORDER — PROPRANOLOL HCL 10 MG PO TABS: ORAL_TABLET | ORAL | 0 refills | Status: DC

## 2020-10-10 NOTE — Patient Instructions (Signed)
DASH Eating Plan DASH stands for "Dietary Approaches to Stop Hypertension." The DASH eating plan is a healthy eating plan that has been shown to reduce high blood pressure (hypertension). It may also reduce your risk for type 2 diabetes, heart disease, and stroke. The DASH eating plan may also help with weight loss. What are tips for following this plan?  General guidelines  Avoid eating more than 2,300 mg (milligrams) of salt (sodium) a day. If you have hypertension, you may need to reduce your sodium intake to 1,500 mg a day.  Limit alcohol intake to no more than 1 drink a day for nonpregnant women and 2 drinks a day for men. One drink equals 12 oz of beer, 5 oz of wine, or 1 oz of hard liquor.  Work with your health care provider to maintain a healthy body weight or to lose weight. Ask what an ideal weight is for you.  Get at least 30 minutes of exercise that causes your heart to beat faster (aerobic exercise) most days of the week. Activities may include walking, swimming, or biking.  Work with your health care provider or diet and nutrition specialist (dietitian) to adjust your eating plan to your individual calorie needs. Reading food labels   Check food labels for the amount of sodium per serving. Choose foods with less than 5 percent of the Daily Value of sodium. Generally, foods with less than 300 mg of sodium per serving fit into this eating plan.  To find whole grains, look for the word "whole" as the first word in the ingredient list. Shopping  Buy products labeled as "low-sodium" or "no salt added."  Buy fresh foods. Avoid canned foods and premade or frozen meals. Cooking  Avoid adding salt when cooking. Use salt-free seasonings or herbs instead of table salt or sea salt. Check with your health care provider or pharmacist before using salt substitutes.  Do not fry foods. Cook foods using healthy methods such as baking, boiling, grilling, and broiling instead.  Cook with  heart-healthy oils, such as olive, canola, soybean, or sunflower oil. Meal planning  Eat a balanced diet that includes: ? 5 or more servings of fruits and vegetables each day. At each meal, try to fill half of your plate with fruits and vegetables. ? Up to 6-8 servings of whole grains each day. ? Less than 6 oz of lean meat, poultry, or fish each day. A 3-oz serving of meat is about the same size as a deck of cards. One egg equals 1 oz. ? 2 servings of low-fat dairy each day. ? A serving of nuts, seeds, or beans 5 times each week. ? Heart-healthy fats. Healthy fats called Omega-3 fatty acids are found in foods such as flaxseeds and coldwater fish, like sardines, salmon, and mackerel.  Limit how much you eat of the following: ? Canned or prepackaged foods. ? Food that is high in trans fat, such as fried foods. ? Food that is high in saturated fat, such as fatty meat. ? Sweets, desserts, sugary drinks, and other foods with added sugar. ? Full-fat dairy products.  Do not salt foods before eating.  Try to eat at least 2 vegetarian meals each week.  Eat more home-cooked food and less restaurant, buffet, and fast food.  When eating at a restaurant, ask that your food be prepared with less salt or no salt, if possible. What foods are recommended? The items listed may not be a complete list. Talk with your dietitian about   what dietary choices are best for you. Grains Whole-grain or whole-wheat bread. Whole-grain or whole-wheat pasta. Brown rice. Oatmeal. Quinoa. Bulgur. Whole-grain and low-sodium cereals. Pita bread. Low-fat, low-sodium crackers. Whole-wheat flour tortillas. Vegetables Fresh or frozen vegetables (raw, steamed, roasted, or grilled). Low-sodium or reduced-sodium tomato and vegetable juice. Low-sodium or reduced-sodium tomato sauce and tomato paste. Low-sodium or reduced-sodium canned vegetables. Fruits All fresh, dried, or frozen fruit. Canned fruit in natural juice (without  added sugar). Meat and other protein foods Skinless chicken or turkey. Ground chicken or turkey. Pork with fat trimmed off. Fish and seafood. Egg whites. Dried beans, peas, or lentils. Unsalted nuts, nut butters, and seeds. Unsalted canned beans. Lean cuts of beef with fat trimmed off. Low-sodium, lean deli meat. Dairy Low-fat (1%) or fat-free (skim) milk. Fat-free, low-fat, or reduced-fat cheeses. Nonfat, low-sodium ricotta or cottage cheese. Low-fat or nonfat yogurt. Low-fat, low-sodium cheese. Fats and oils Soft margarine without trans fats. Vegetable oil. Low-fat, reduced-fat, or light mayonnaise and salad dressings (reduced-sodium). Canola, safflower, olive, soybean, and sunflower oils. Avocado. Seasoning and other foods Herbs. Spices. Seasoning mixes without salt. Unsalted popcorn and pretzels. Fat-free sweets. What foods are not recommended? The items listed may not be a complete list. Talk with your dietitian about what dietary choices are best for you. Grains Baked goods made with fat, such as croissants, muffins, or some breads. Dry pasta or rice meal packs. Vegetables Creamed or fried vegetables. Vegetables in a cheese sauce. Regular canned vegetables (not low-sodium or reduced-sodium). Regular canned tomato sauce and paste (not low-sodium or reduced-sodium). Regular tomato and vegetable juice (not low-sodium or reduced-sodium). Pickles. Olives. Fruits Canned fruit in a light or heavy syrup. Fried fruit. Fruit in cream or butter sauce. Meat and other protein foods Fatty cuts of meat. Ribs. Fried meat. Bacon. Sausage. Bologna and other processed lunch meats. Salami. Fatback. Hotdogs. Bratwurst. Salted nuts and seeds. Canned beans with added salt. Canned or smoked fish. Whole eggs or egg yolks. Chicken or turkey with skin. Dairy Whole or 2% milk, cream, and half-and-half. Whole or full-fat cream cheese. Whole-fat or sweetened yogurt. Full-fat cheese. Nondairy creamers. Whipped toppings.  Processed cheese and cheese spreads. Fats and oils Butter. Stick margarine. Lard. Shortening. Ghee. Bacon fat. Tropical oils, such as coconut, palm kernel, or palm oil. Seasoning and other foods Salted popcorn and pretzels. Onion salt, garlic salt, seasoned salt, table salt, and sea salt. Worcestershire sauce. Tartar sauce. Barbecue sauce. Teriyaki sauce. Soy sauce, including reduced-sodium. Steak sauce. Canned and packaged gravies. Fish sauce. Oyster sauce. Cocktail sauce. Horseradish that you find on the shelf. Ketchup. Mustard. Meat flavorings and tenderizers. Bouillon cubes. Hot sauce and Tabasco sauce. Premade or packaged marinades. Premade or packaged taco seasonings. Relishes. Regular salad dressings. Where to find more information:  National Heart, Lung, and Blood Institute: www.nhlbi.nih.gov  American Heart Association: www.heart.org Summary  The DASH eating plan is a healthy eating plan that has been shown to reduce high blood pressure (hypertension). It may also reduce your risk for type 2 diabetes, heart disease, and stroke.  With the DASH eating plan, you should limit salt (sodium) intake to 2,300 mg a day. If you have hypertension, you may need to reduce your sodium intake to 1,500 mg a day.  When on the DASH eating plan, aim to eat more fresh fruits and vegetables, whole grains, lean proteins, low-fat dairy, and heart-healthy fats.  Work with your health care provider or diet and nutrition specialist (dietitian) to adjust your eating plan to your   individual calorie needs. This information is not intended to replace advice given to you by your health care provider. Make sure you discuss any questions you have with your health care provider. Document Revised: 09/10/2017 Document Reviewed: 09/21/2016 Elsevier Patient Education  2020 Elsevier Inc.  

## 2020-10-10 NOTE — Progress Notes (Signed)
Subjective:    Patient ID: Lisa Li, female    DOB: 05-04-1966, 54 y.o.   MRN: [id number]  HPI  Patient arrives for a follow up on ADHD. Patient currently on Adderall XR 30 mg daily. Last Toxassure 03/2020.   Patient complains with pain on the left side of her neck that radiates up the left side of her face she describes as a sharp pain aching pain intermittent.  Denies any major troubles with this.  Happens almost every single day.  Sometimes for a few minutes sometimes for several hours no nausea blurred vision no vomiting  She also relates blood pressure has been higher than normal but she does not feel it is due to her ADD medicine because it also occurs on days where she is not taking her ADD medicine  She is trying to watch her portions and stay physically active She is under a lot of stress with work with Covid  This patient was seen today for chronic pain  The medication list was reviewed and updated.   -Compliance with medication: Relates compliance with the medicine  - Number patient states they take daily: Typically takes up to 4/day  -when was the last dose patient took?  Earlier today  The patient was advised the importance of maintaining medication and not using illegal substances with these.  Here for refills and follow up  The patient was educated that we can provide 3 monthly scripts for their medication, it is their responsibility to follow the instructions.  Side effects or complications from medications: She denies any side effects from the pain medicine  Patient is aware that pain medications are meant to minimize the severity of the pain to allow their pain levels to improve to allow for better function. They are aware of that pain medications cannot totally remove their pain.  Due for UDT ( at least once per year) : Did one earlier this summer  Scale of 1 to 10 ( 1 is least 10 is most) Your pain level without the medicine: 8 Your pain level with  medication 3  Scale 1 to 10 ( 1-helps very little, 10 helps very well) How well does your pain medication reduce your pain so you can function better through out the day?  7  This patient has adult ADD. Takes medication responsibly. Medication does help the patient focus in be more functional. Patient relates that they are or not abusing the medication or misusing the medication. The patient understands that if they're having any negative side effects such as elevated high blood pressure severe headaches they would need stop the medication follow-up immediately. They also understand that the prescriptions are to last for 3 months then the patient will need to follow-up before having further prescriptions.  Patient compliance on her workdays  Does medication help patient function /attention better she states without it she has a very difficult time focusing and staying attentive and it hurts her job performance greatly  Side effects she denies side effects with the medicine  The patient was seen today as part of a comprehensive diabetic check up.the patient does have diabetes.  The patient follows here on a regular basis.  The patient relates medication compliance. No significant side effects to the medications. Denies any low glucose spells. Relates compliance with diet to a reasonable level. Patient does do labwork intermittently and understands the dangers of diabetes.  Patient here for follow-up regarding cholesterol.  The patient does have hyperlipidemia.  Patient does try to maintain a reasonable diet.  Patient does take the medication on a regular basis.  Denies missing a dose.  The patient denies any obvious side effects.  Prior blood work results reviewed with the patient.  The patient is aware of his cholesterol goals and the need to keep it under good control to lessen the risk of disease. Does not tolerate statins  Patient for blood pressure check up.  The patient does have hypertension.   The patient is on medication.  Patient relates compliance with meds. Todays BP reviewed with the patient. Patient denies issues with medication. Patient relates reasonable diet. Patient tries to minimize salt. Patient aware of BP goals.     Review of Systems  Constitutional: Negative for activity change, appetite change and fatigue.  HENT: Negative for congestion.   Respiratory: Negative for cough.   Cardiovascular: Negative for chest pain.  Gastrointestinal: Negative for abdominal pain.  Skin: Negative for color change.  Neurological: Negative for headaches.  Psychiatric/Behavioral: Negative for behavioral problems.       Objective:   Physical Exam Vitals reviewed.  Constitutional:      Appearance: She is well-developed and well-nourished.  HENT:     Head: Normocephalic.  Cardiovascular:     Rate and Rhythm: Normal rate and regular rhythm.     Heart sounds: Normal heart sounds. No murmur heard.   Pulmonary:     Effort: Pulmonary effort is normal.     Breath sounds: Normal breath sounds.  Skin:    General: Skin is warm and dry.  Neurological:     Mental Status: She is alert.  Psychiatric:        Mood and Affect: Mood and affect normal.    Diabetic foot exam normal bilateral  Mild sinus congestion eardrums are normal throat is normal     Assessment & Plan:  1. Essential hypertension Blood pressure she will send [place] updates over the course the next few weeks how this is going initially her blood pressure is elevated here in the office but after she been sitting for period of time it did come down best reading I got was 128/86  2. Controlled type 2 diabetes mellitus without complication, without long-term current use of insulin (HCC) In the past her diabetes been under good control she was unable to get her labs recently she relates her sugars been looking good she will send [place] labs as soon as they are done  3. Other hyperlipidemia She does no labs show and  potentially readdress this t take a statin did not tolerate pravastatin.  We will wait and see what her labs show  4. Attention deficit disorder, unspecified hyperactivity presence Adult ADD does benefit from a medicine we will continue the medicine at this time she will take it on her workdays but not on other days we will send in refills.  5. Chronic pain syndrome The patient was seen in followup for chronic pain. A review over at their current pain status was discussed. Drug registry was checked. Prescriptions were given.  Regular follow-up recommended. Discussion was held regarding the importance of compliance with medication as well as pain medication contract.  Patient was informed that medication may cause drowsiness and should not be combined  with other medications/alcohol or street drugs. If the patient feels medication is causing altered alertness then do not drive or operate dangerous equipment.  She does relate her pain medicine does help her she will continue to try to  take it responsibly.  3 additional prescriptions will be sent in drug registry was checked  Amoxicillin prescribed because of potential acute rhinosinusitis and this will be only if this develops over the next 7 days I am personally not convinced currently that she has this but a prescription was given to her out of courtesy to use if she should get worse over the weekend

## 2020-10-14 ENCOUNTER — Other Ambulatory Visit: Payer: Self-pay | Admitting: Family Medicine

## 2020-10-14 ENCOUNTER — Telehealth: Payer: Self-pay

## 2020-10-14 ENCOUNTER — Other Ambulatory Visit: Payer: Self-pay | Admitting: *Deleted

## 2020-10-14 MED ORDER — GABAPENTIN 100 MG PO CAPS: ORAL_CAPSULE | ORAL | 5 refills | Status: DC

## 2020-10-14 NOTE — Telephone Encounter (Signed)
Discussed with pt. Pt verbalized understanding and med sent to pharm.  

## 2020-10-14 NOTE — Telephone Encounter (Signed)
Looked at med list and gabapentin last sent in 2020. No directions and no dr name attached to med. Called pt to clarify. She was recently seen and states dr Lorin Picket wanted her to start back on it and she told him she may have some at home but after checking she does need rx sent in to [facility].

## 2020-10-14 NOTE — Telephone Encounter (Signed)
Gabapentin 100 mg capsule, take 1 capsule every 8 hours, #90, 5 refills The patient should gradually titrate up I would recommend starting off 1 each evening after the first week may take 1 twice daily, 2 weeks later if necessary may go up to 3 times a day, may send [place] update within the next 2 to 3 weeks via MyChart

## 2020-10-14 NOTE — Telephone Encounter (Signed)
Lisa Li said several prescriptions were sent in today but they also need Gabapentin 100 mg. [facility]

## 2020-10-23 ENCOUNTER — Other Ambulatory Visit: Payer: Self-pay

## 2020-10-23 ENCOUNTER — Telehealth: Payer: Self-pay | Admitting: Family Medicine

## 2020-10-23 ENCOUNTER — Telehealth (INDEPENDENT_AMBULATORY_CARE_PROVIDER_SITE_OTHER): Payer: Managed Care, Other (non HMO) | Admitting: Family Medicine

## 2020-10-23 DIAGNOSIS — E7849 Other hyperlipidemia: Secondary | ICD-10-CM | POA: Diagnosis not present

## 2020-10-23 DIAGNOSIS — E119 Type 2 diabetes mellitus without complications: Secondary | ICD-10-CM

## 2020-10-23 DIAGNOSIS — I10 Essential (primary) hypertension: Secondary | ICD-10-CM | POA: Diagnosis not present

## 2020-10-23 DIAGNOSIS — R5383 Other fatigue: Secondary | ICD-10-CM

## 2020-10-23 DIAGNOSIS — Z79899 Other long term (current) drug therapy: Secondary | ICD-10-CM

## 2020-10-23 MED ORDER — VALSARTAN 160 MG PO TABS: 160.0000 mg | ORAL_TABLET | Freq: Every day | ORAL | 5 refills | Status: DC

## 2020-10-23 MED ORDER — AMLODIPINE BESYLATE 5 MG PO TABS: 5.0000 mg | ORAL_TABLET | Freq: Every day | ORAL | 5 refills | Status: DC

## 2020-10-23 MED ORDER — PROPRANOLOL HCL 10 MG PO TABS: ORAL_TABLET | ORAL | 5 refills | Status: DC

## 2020-10-23 NOTE — Telephone Encounter (Signed)
Pt calling in with blood pressure readings 1/4-159/89 1/5-174/107 1/6-146/88 1/7-139/81 1/7-152/89 1/8-153/85 1/9-161/93 1/10-155/88 1/10-179/92 1/11-156/80  Pt states she is on Propanolol 20mg  daily and Losartan 100. Pt increased Propanolol to 3 times a day on 10/16/20 after seeing increased numbers. Pt did stop Adderall; her last dose was 10/13/20. Pt states she is taking losartan in the morning and propanolol mid day and at night. Last night pt had a banana and began to feel sick. BP was 182/105. Pt became nauseated and vomited. Pt has had a nagging headache since increased numbers, face/lips tingling, uncontrollable urine at time, some vision changes(blurry at time) and some dizziness. Pt would like provider to give her a call at his convenience to discuss numbers and get a game plan going. Pt states she is aware of when to go to ER. pt also needing refill of Propanolol since she increased it to TID. Please advise. Thank you  Pt callback number [phone] [facility]

## 2020-10-23 NOTE — Telephone Encounter (Signed)
Discussed with pt. And pt added to schedule for today at 5.

## 2020-10-23 NOTE — Telephone Encounter (Signed)
Please place on the schedule I could call her when I am finished with the next meeting which would probably be in the vicinity of 5 PM, please place on the schedule as a consultation thank you

## 2020-10-23 NOTE — Progress Notes (Signed)
   Subjective:    Patient ID: Lisa Li, female    DOB: 02-02-1966, 55 y.o.   MRN: [id number]  HPI Pt calling in with blood pressure readings 1/4-159/89 1/5-174/107 1/6-146/88 1/7-139/81 1/7-152/89 1/8-153/85 1/9-161/93 1/10-155/88 1/10-179/92 1/11-156/80  Pt states she is on Propanolol 20mg  daily and Losartan 100. Pt increased Propanolol to 3 times a day on 10/16/20 after seeing increased numbers. Pt did stop Adderall; her last dose was 10/13/20. Pt states she is taking losartan in the morning and propanolol mid day and at night. Last night pt had a banana and began to feel sick. BP was 182/105. Pt became nauseated and vomited. Pt has had a nagging headache since increased numbers, face/lips tingling, uncontrollable urine at time, some vision changes(blurry at time) and some dizziness. Pt would like provider to give her a call at his convenience to discuss numbers and get a game plan going. Pt states she is aware of when to go to ER. pt also needing refill of Propanolol since she increased it to TID.   Virtual Visit via Telephone Note  I connected with Lisa Li on 10/23/20 at  4:10 PM EST by telephone and verified that I am speaking with the correct person using two identifiers.  Location: Patient: home Provider: office   I discussed the limitations, risks, security and privacy concerns of performing an evaluation and management service by telephone and the availability of in person appointments. I also discussed with the patient that there may be a patient responsible charge related to this service. The patient expressed understanding and agreed to proceed.   History of Present Illness:    Observations/Objective:   Assessment and Plan:   Follow Up Instructions:    I discussed the assessment and treatment plan with the patient. The patient was provided an opportunity to ask questions and all were answered. The patient agreed with the plan and demonstrated an  understanding of the instructions.   The patient was advised to call back or seek an in-person evaluation if the symptoms worsen or if the condition fails to improve as anticipated.  I provided 20 minutes of non-face-to-face time during this encounter.          Review of Systems     Objective:   Physical Exam  Today's visit was via telephone Physical exam was not possible for this visit       Assessment & Plan:  Chart review Time spent with patient discussing Patient would benefit from change in medication Add amlodipine Switch from losartan to valsartan Patient to do nurse visit on Friday at 1130 to check blood pressure May need a work excuse for this weekend  I believe headache is from the high blood pressure I do not feel she has any type of brain tumor but obviously if she persists having severe headaches go to ER or follow-up with [place] and would need to have a scan Patient not having any stroke symptoms currently Stay away from ADD medicine for now Lab work will be ordered

## 2020-10-23 NOTE — Telephone Encounter (Signed)
Lisa Li, Lisa Li are scheduled for a virtual visit with your provider today.    Just as we do with appointments in the office, we must obtain your consent to participate.  Your consent will be active for this visit and any virtual visit you may have with one of our providers in the next 365 days.    If you have a MyChart account, I can also send a copy of this consent to you electronically.  All virtual visits are billed to your insurance company just like a traditional visit in the office.  As this is a virtual visit, video technology does not allow for your provider to perform a traditional examination.  This may limit your provider's ability to fully assess your condition.  If your provider identifies any concerns that need to be evaluated in person or the need to arrange testing such as labs, EKG, etc, we will make arrangements to do so.    Although advances in technology are sophisticated, we cannot ensure that it will always work on either your end or our end.  If the connection with a video visit is poor, we may have to switch to a telephone visit.  With either a video or telephone visit, we are not always able to ensure that we have a secure connection.   I need to obtain your verbal consent now.   Are you willing to proceed with your visit today?   Lisa Li has provided verbal consent on 10/23/2020 for a virtual visit (video or telephone).   Vicente Males, LPN 5/00/9381  8:29 PM

## 2020-10-24 NOTE — Addendum Note (Signed)
Addended by: Vicente Males on: 10/24/2020 02:00 PM   Modules accepted: Orders

## 2020-10-25 ENCOUNTER — Ambulatory Visit (INDEPENDENT_AMBULATORY_CARE_PROVIDER_SITE_OTHER): Payer: Managed Care, Other (non HMO) | Admitting: Family Medicine

## 2020-10-25 DIAGNOSIS — I10 Essential (primary) hypertension: Secondary | ICD-10-CM

## 2020-10-25 NOTE — Progress Notes (Signed)
Blood pressure elevated Increase amlodipine 5 mg tablet Will take 2/day Will follow-up next Wednesday for blood pressure recheck Follow-up sooner problems Taken out of work this week If severe headaches vomiting go to ER  Blood pressure was checked multiple times both with manual cuff and electronic cuff all times they were elevated in the 150/98 range Work excuse given for the weekend Patient was warned that if she starts having severe headaches vomiting with headaches to go to ER Follow-up next week for blood pressure recheck as planned

## 2020-10-28 ENCOUNTER — Ambulatory Visit: Payer: Managed Care, Other (non HMO) | Admitting: Family Medicine

## 2020-10-30 ENCOUNTER — Other Ambulatory Visit: Payer: Self-pay

## 2020-10-30 ENCOUNTER — Encounter: Payer: Self-pay | Admitting: Family Medicine

## 2020-10-30 ENCOUNTER — Ambulatory Visit (INDEPENDENT_AMBULATORY_CARE_PROVIDER_SITE_OTHER): Payer: Managed Care, Other (non HMO) | Admitting: Family Medicine

## 2020-10-30 VITALS — BP 163/94 | HR 95 | Temp 98.2°F

## 2020-10-30 DIAGNOSIS — R0600 Dyspnea, unspecified: Secondary | ICD-10-CM

## 2020-10-30 DIAGNOSIS — I10 Essential (primary) hypertension: Secondary | ICD-10-CM | POA: Diagnosis not present

## 2020-10-30 DIAGNOSIS — R0609 Other forms of dyspnea: Secondary | ICD-10-CM

## 2020-10-30 NOTE — Progress Notes (Signed)
   Subjective:    Patient ID: Lisa Li, female    DOB: 08-09-1966, 55 y.o.   MRN: [id number] Very nice patient Recently had what was more than likely a viral illness with vomiting and diarrhea not feeling well over the weekend now starting to feel better during the sickness the heart rate goes up sometimes in the mid 100s sometimes as high as 120 with minimal activity  Patient also relates that she is finding herself feeling short of breath when she does activities such as walking up steps.  Because of her back condition she is unable to do a lot of exercise.  It is possible that some of this problem could be conditioning issue but some of this could be an underlying heart related issue she was being followed by cardiology and is scheduled to have a follow-up with them later this spring HPI Patient following up on hypertension. Patient has not taken any blood pressure medication since being seen last week due to being sick.   Patient mentions having a sickness over the weekend with vomiting, diarrhea, fever and body aches. She is feeling better today. Review of Systems  Constitutional: Negative for activity change, fatigue and fever.  HENT: Negative for congestion and rhinorrhea.   Respiratory: Negative for cough, chest tightness and shortness of breath.   Cardiovascular: Negative for chest pain and leg swelling.  Gastrointestinal: Negative for abdominal pain and nausea.  Skin: Negative for color change.  Neurological: Negative for dizziness and headaches.  Psychiatric/Behavioral: Negative for agitation and behavioral problems.       Objective:   Physical Exam Vitals reviewed.  Constitutional:      Appearance: She is well-developed and well-nourished.  HENT:     Head: Normocephalic.  Cardiovascular:     Rate and Rhythm: Normal rate and regular rhythm.     Heart sounds: Normal heart sounds. No murmur heard.   Pulmonary:     Effort: Pulmonary effort is normal.     Breath  sounds: Normal breath sounds.  Skin:    General: Skin is warm and dry.  Neurological:     Mental Status: She is alert.  Psychiatric:        Mood and Affect: Mood and affect normal.           Assessment & Plan:  Recent vomiting diarrhea body aches more than likely a viral illness COVID test was negative if ongoing troubles notify us  Blood pressure elevated resume amlodipine 5 mg daily Diovan 160 mg daily Patient to send [place] some readings within the next couple weeks   Shortness of breath could be related to underlying coronary artery disease Get lab work completed Consider Imdur if symptoms persist in regards to shortness of breath with activity Also recommended for the patient to do a follow-up visit with cardiology Patient will keep [place] updated with MyChart messages and will notify us sooner if any problems  We will forward this documentation to Dr. Mauricio Po cardiology [facility]

## 2020-10-30 NOTE — Patient Instructions (Addendum)
Amlodipine 5 mg one daily Valsartan 160 mg one daily   Send me updates regarding BP every one to 2 weeks for now regarding BP, HR, and breathing

## 2020-11-05 ENCOUNTER — Encounter: Payer: Self-pay | Admitting: Family Medicine

## 2020-11-05 ENCOUNTER — Telehealth: Payer: Self-pay | Admitting: *Deleted

## 2020-11-05 NOTE — Telephone Encounter (Signed)
Pt needs work note stating she can return on 1/27 without restrictions. Please fax at [phone] atten: Joycelyn Schmid at [employer] [employer]. Please call pt after faxing note.   Pt states bp has been good. 118/78, 139/89.

## 2020-11-05 NOTE — Telephone Encounter (Signed)
Please give letter and fax to number in message and pt requested a call back to let her know after its been faxed. thankyou

## 2020-11-05 NOTE — Telephone Encounter (Signed)
Please provide patient a letter stating that she may return without restrictions on the 27th

## 2020-11-07 ENCOUNTER — Telehealth: Payer: Self-pay | Admitting: *Deleted

## 2020-11-07 ENCOUNTER — Other Ambulatory Visit: Payer: Self-pay | Admitting: Family Medicine

## 2020-11-07 MED ORDER — HYDROCOD POLST-CPM POLST ER 10-8 MG/5ML PO SUER: ORAL | 0 refills | Status: DC

## 2020-11-07 NOTE — Telephone Encounter (Signed)
Certainly if she gets worse we would want to see her.  She can call if any troubles.  I did send in the Tussionex but do not utilize pain medicine while on this Tussionex because both have a narcotic and have increased risk of respiratory depression

## 2020-11-07 NOTE — Telephone Encounter (Signed)
100.1 fever, coughing up yellow sputum, body aches, headaches, some nausea and fatigue. Symptoms started Tuesday 1/25. Tested positive 1/26. Patient is doing ok at home but was told from work that she had to notify her pcp. Patient advised of what to look out for. Cough has been worse at night and keeping her up- she is requesting a cough med to be sent to [facility] - tussinex usually works best for her.

## 2020-11-07 NOTE — Telephone Encounter (Signed)
Patient notified of Dr. Bary Leriche recommendation and verbalized understanding.

## 2020-11-15 ENCOUNTER — Other Ambulatory Visit: Payer: Self-pay

## 2020-11-15 ENCOUNTER — Ambulatory Visit (INDEPENDENT_AMBULATORY_CARE_PROVIDER_SITE_OTHER): Payer: Managed Care, Other (non HMO) | Admitting: Family Medicine

## 2020-11-15 ENCOUNTER — Encounter: Payer: Self-pay | Admitting: Family Medicine

## 2020-11-15 VITALS — HR 100 | Temp 98.2°F | Resp 16

## 2020-11-15 DIAGNOSIS — R300 Dysuria: Secondary | ICD-10-CM | POA: Diagnosis not present

## 2020-11-15 DIAGNOSIS — U071 COVID-19: Secondary | ICD-10-CM | POA: Insufficient documentation

## 2020-11-15 DIAGNOSIS — K5792 Diverticulitis of intestine, part unspecified, without perforation or abscess without bleeding: Secondary | ICD-10-CM | POA: Insufficient documentation

## 2020-11-15 LAB — POCT URINALYSIS DIPSTICK
Glucose, UA: POSITIVE — AB
Spec Grav, UA: <=1.005 — AB (ref 1.010–1.025)
pH, UA: 5 (ref 5.0–8.0)

## 2020-11-15 MED ORDER — AMOXICILLIN-POT CLAVULANATE 875-125 MG PO TABS: 1.0000 | ORAL_TABLET | Freq: Two times a day (BID) | ORAL | 0 refills | Status: DC

## 2020-11-15 NOTE — Progress Notes (Signed)
Patient ID: Lisa Li, female    DOB: September 24, 1966, 55 y.o.   MRN: [id number]   No chief complaint on file.  Subjective:  CC: covid positive, needs extension for return to work  This is not a new problem. Presents today after testing positive for COVID 9 days ago. Her health at work excused her for 10 days, she is scheduled to return to work tomorrow, she is still experiencing extreme fatigue, very low energy and does not feel that she can complete a 12-hour shift tomorrow. She desires an extension for her return to work date. She does not feel ready to return to work. She reports that she is staying hydrated, still experiencing some fatigue and chills, no fever. She does report that a new symptom is painful urination, we will check a urine today.   recheck on covid. Tested positive 9 days ago. Symptoms started 9 days ago. No energy, a little diarrhea, coughing. Feels horrible. Goes from hot to cold all the time.    Medical History Lisa Li has a past medical history of Carpal tunnel syndrome on both sides (06/13/2019), Chronic pain (05/09/2019), Coronary artery disease (05/09/2019), Frequent UTI, Kidney stone, Meniere's disease (2017), MGUS (monoclonal gammopathy of unknown significance), Migraine headache, Myalgia, and Pre-diabetes.   Outpatient Encounter Medications as of 11/15/2020  Medication Sig  . amLODipine (NORVASC) 5 MG tablet Take 1 tablet (5 mg total) by mouth daily.  Marland Kitchen amoxicillin-clavulanate (AUGMENTIN) 875-125 MG tablet Take 1 tablet by mouth 2 (two) times daily.  Marland Kitchen amphetamine-dextroamphetamine (ADDERALL XR) 30 MG 24 hr capsule Take 1 capsule (30 mg total) by mouth every morning.  Marland Kitchen amphetamine-dextroamphetamine (ADDERALL XR) 30 MG 24 hr capsule Take 1 capsule (30 mg total) by mouth daily.  Marland Kitchen amphetamine-dextroamphetamine (ADDERALL XR) 30 MG 24 hr capsule Take 1 capsule (30 mg total) by mouth daily.  . betamethasone dipropionate (DIPROLENE) 0.05 % cream Apply topically 2 (two)  times daily.  . chlorpheniramine-HYDROcodone (TUSSIONEX PENNKINETIC ER) 10-8 MG/5ML SUER 5 mL twice daily as needed cough  . chlorzoxazone (PARAFON FORTE DSC) 500 MG tablet Take 1 tablet (500 mg total) by mouth 3 (three) times daily as needed for muscle spasms.  . diazepam (VALIUM) 5 MG tablet TAKE 1 TABLET AT BEDTIME AS NEEDED.  Marland Kitchen diclofenac sodium (VOLTAREN) 1 % GEL Apply 4 g topically 4 (four) times daily.  Marland Kitchen docusate sodium (COLACE) 100 MG capsule Take 100 mg by mouth as needed.   Marland Kitchen estradiol (VIVELLE-DOT) 0.1 MG/24HR patch Place 1 patch (0.1 mg total) onto the skin 2 (two) times a week.  Marland Kitchen FLUoxetine (PROZAC) 10 MG capsule TAKE ONE CAPSULE BY MOUTH ONCE DAILY.  Marland Kitchen FLUoxetine (PROZAC) 10 MG tablet TAKE (1) TABLET BY MOUTH ONCE DAILY.  Marland Kitchen gabapentin (NEURONTIN) 100 MG capsule Take one capsule every 8 hours prn  . HYDROcodone-acetaminophen (NORCO/VICODIN) 5-325 MG tablet Take one every 4 hours prn pain. Maximum 5 per day  . HYDROcodone-acetaminophen (NORCO/VICODIN) 5-325 MG tablet 1 every 4 hours as needed pain maximum 5/day  . HYDROcodone-acetaminophen (NORCO/VICODIN) 5-325 MG tablet Take one every 4 hours prn pain. Maximum 5 per day  . lidocaine (XYLOCAINE JELLY) 2 % jelly Apply 1 application topically as needed. Apply to leg  . lidocaine (XYLOCAINE JELLY) 2 % jelly Apply 1 application topically as needed.  . lidocaine (XYLOCAINE) 5 % ointment Apply 1 application topically as needed. Apply to toe  . lidocaine-prilocaine (EMLA) cream APPLY TO AFFECTED AREAS2TWICE DAILY AS NEEDED.  Marland Kitchen loratadine (CLARITIN) 10  MG tablet Take 10 mg by mouth daily.  . meloxicam (MOBIC) 15 MG tablet TAKE 1 TABLET ONCE DAILY. DO NOT USE ON THE SAME DAY AS NAPROXEN.  . metFORMIN (GLUCOPHAGE-XR) 500 MG 24 hr tablet TAKE 1 TABLET BY MOUTH TWICE DAILY  . nystatin (MYCOSTATIN) 100000 UNIT/ML suspension Swish and spit 5 mls po QID  . omeprazole (PRILOSEC) 40 MG capsule TAKE ONE CAPSULE BY MOUTH ONCE DAILY.  Marland Kitchen ondansetron  (ZOFRAN ODT) 8 MG disintegrating tablet Take 1 tablet 3 times daily as needed for nausea  . ondansetron (ZOFRAN ODT) 8 MG disintegrating tablet Take 1 tablet TID PRN  . ondansetron (ZOFRAN) 8 MG tablet Take one tablet po TID prn  . propranolol (INDERAL) 10 MG tablet Take one tablet po twice a day  . Semaglutide,0.25 or 0.5MG /DOS, (OZEMPIC, 0.25 OR 0.5 MG/DOSE,) 2 MG/1.5ML SOPN Inject 0.25mg  once per week for 4 weeks then inject 0.5mg  once per week.  . triamcinolone (NASACORT) 55 MCG/ACT AERO nasal inhaler USE 1 SPRAY IN EACH NOSTRIL DAILY.  Marland Kitchen triamcinolone cream (KENALOG) 0.1 % Apply to affected area twice daily as needed  . valsartan (DIOVAN) 160 MG tablet Take 1 tablet (160 mg total) by mouth daily.  . [DISCONTINUED] amoxicillin (AMOXIL) 500 MG capsule Take 1 capsule (500 mg total) by mouth 3 (three) times daily.   No facility-administered encounter medications on file as of 11/15/2020.     Review of Systems  Constitutional: Positive for chills and fatigue. Negative for fever.  HENT: Positive for congestion and sore throat. Negative for ear pain, sinus pressure and sinus pain.        Achy throat  Respiratory: Positive for cough. Negative for shortness of breath.   Gastrointestinal: Positive for abdominal pain and diarrhea. Negative for vomiting.       Little History of divertitlitis  Genitourinary: Positive for dysuria.  Musculoskeletal: Positive for myalgias.  Neurological: Positive for headaches.       Improved      Vitals Pulse 100   Temp 98.2 F (36.8 C)   Resp 16   LMP 05/31/2013   SpO2 98%   Objective:   Physical Exam Vitals reviewed.  Constitutional:      Appearance: Normal appearance.  Cardiovascular:     Rate and Rhythm: Normal rate and regular rhythm.     Heart sounds: Normal heart sounds.  Pulmonary:     Effort: Pulmonary effort is normal.     Breath sounds: Normal breath sounds.  Abdominal:     Tenderness: There is abdominal tenderness in the left lower  quadrant.     Comments: History of diverticulitis.   Skin:    General: Skin is warm and dry.  Neurological:     General: No focal deficit present.     Mental Status: She is alert.  Psychiatric:        Behavior: Behavior normal.     Results for orders placed or performed in visit on 11/15/20  POCT urinalysis dipstick  Result Value Ref Range   Color, UA     Clarity, UA     Glucose, UA Positive (A) Negative   Bilirubin, UA     Ketones, UA     Spec Grav, UA <=1.005 (A) 1.010 - 1.025   Blood, UA     pH, UA 5.0 5.0 - 8.0   Protein, UA     Urobilinogen, UA     Nitrite, UA     Leukocytes, UA     Appearance  Odor      Assessment and Plan   1. Dysuria - POCT urinalysis dipstick  2. Diverticulitis - amoxicillin-clavulanate (AUGMENTIN) 875-125 MG tablet; Take 1 tablet by mouth 2 (two) times daily.  Dispense: 20 tablet; Refill: 0  3. COVID-19 virus infection   Deconditioned due to Covid infection and prolonged bedrest. During physical exam, she revealed that she was having some abdominal pain, there is tenderness noted in the left lower quadrant. She has a history of diverticulitis, will treat with Augmentin, for 10 days, with close follow-up. Urine negative for infection.  Agrees with plan of care discussed today. Understands warning signs to seek further care: chest pain, shortness of breath, any significant change in health.  Understands to follow-up on Monday, to ensure improvement in abdominal pain. Extension to be out of work given she will return on next Thursday. Work note provided. She is encouraged to get out of bed, move about, to increase her strength. With rest periods in between. Warnings provided concerning diverticulitis, and reasons to be seen at the emergency department. Understands risk of perforation.   Pecolia Ades, NP 11/17/2020

## 2020-11-17 ENCOUNTER — Encounter: Payer: Self-pay | Admitting: Family Medicine

## 2020-11-18 ENCOUNTER — Encounter: Payer: Self-pay | Admitting: Family Medicine

## 2020-11-18 ENCOUNTER — Ambulatory Visit (INDEPENDENT_AMBULATORY_CARE_PROVIDER_SITE_OTHER): Payer: Managed Care, Other (non HMO) | Admitting: Family Medicine

## 2020-11-18 ENCOUNTER — Other Ambulatory Visit: Payer: Self-pay

## 2020-11-18 VITALS — HR 102 | Temp 98.0°F | Resp 16

## 2020-11-18 DIAGNOSIS — R059 Cough, unspecified: Secondary | ICD-10-CM

## 2020-11-18 DIAGNOSIS — U071 COVID-19: Secondary | ICD-10-CM | POA: Diagnosis not present

## 2020-11-18 MED ORDER — ALBUTEROL SULFATE HFA 108 (90 BASE) MCG/ACT IN AERS: 2.0000 | INHALATION_SPRAY | Freq: Four times a day (QID) | RESPIRATORY_TRACT | 0 refills | Status: DC | PRN

## 2020-11-18 NOTE — Progress Notes (Signed)
Patient ID: Lisa Li, female    DOB: 04/02/1966, 55 y.o.   MRN: [id number]   Chief Complaint  Patient presents with  . Abdominal Pain    Follow up on diverticulitis and Covid   Subjective:  CC: follow-up on abdominal pain  This is not a new problem.  Presents today for an acute visit for follow-up for abdominal pain.  Was seen on Friday, February 4, for COVID symptoms and dysuria, complained of abdominal pain, has a history of diverticulitis, was placed on antibiotics.  She reports that her abdominal pain is much better, still having a slight twinge of pain in the left lower quadrant.  She is taking the antibiotics.  Also reports that she had a  rough night last night.  Reports that she had a sudden onset headache, fever of 100.4, she threw up and felt terrible.  This has since resolved, however, she is still extremely deconditioned from her Covid infection.  She was diagnosed with Covid on November 06, 2020.  She is scheduled to return to work this Thursday, November 21, 2020, she is concerned that she will not be ready.  She is requesting the albuterol inhaler that we discussed on Friday.    Medical History Lisa Li has a past medical history of Carpal tunnel syndrome on both sides (06/13/2019), Chronic pain (05/09/2019), Coronary artery disease (05/09/2019), Frequent UTI, Kidney stone, Meniere's disease (2017), MGUS (monoclonal gammopathy of unknown significance), Migraine headache, Myalgia, and Pre-diabetes.   Outpatient Encounter Medications as of 11/18/2020  Medication Sig  . albuterol (VENTOLIN HFA) 108 (90 Base) MCG/ACT inhaler Inhale 2 puffs into the lungs every 6 (six) hours as needed for wheezing or shortness of breath.  Marland Kitchen amLODipine (NORVASC) 5 MG tablet Take 1 tablet (5 mg total) by mouth daily.  Marland Kitchen amoxicillin-clavulanate (AUGMENTIN) 875-125 MG tablet Take 1 tablet by mouth 2 (two) times daily.  Marland Kitchen amphetamine-dextroamphetamine (ADDERALL XR) 30 MG 24 hr capsule Take 1 capsule (30  mg total) by mouth every morning.  Marland Kitchen amphetamine-dextroamphetamine (ADDERALL XR) 30 MG 24 hr capsule Take 1 capsule (30 mg total) by mouth daily.  Marland Kitchen amphetamine-dextroamphetamine (ADDERALL XR) 30 MG 24 hr capsule Take 1 capsule (30 mg total) by mouth daily.  . betamethasone dipropionate (DIPROLENE) 0.05 % cream Apply topically 2 (two) times daily.  . chlorpheniramine-HYDROcodone (TUSSIONEX PENNKINETIC ER) 10-8 MG/5ML SUER 5 mL twice daily as needed cough  . chlorzoxazone (PARAFON FORTE DSC) 500 MG tablet Take 1 tablet (500 mg total) by mouth 3 (three) times daily as needed for muscle spasms.  . diazepam (VALIUM) 5 MG tablet TAKE 1 TABLET AT BEDTIME AS NEEDED.  Marland Kitchen diclofenac sodium (VOLTAREN) 1 % GEL Apply 4 g topically 4 (four) times daily.  Marland Kitchen docusate sodium (COLACE) 100 MG capsule Take 100 mg by mouth as needed.   Marland Kitchen estradiol (VIVELLE-DOT) 0.1 MG/24HR patch Place 1 patch (0.1 mg total) onto the skin 2 (two) times a week.  Marland Kitchen FLUoxetine (PROZAC) 10 MG capsule TAKE ONE CAPSULE BY MOUTH ONCE DAILY.  Marland Kitchen FLUoxetine (PROZAC) 10 MG tablet TAKE (1) TABLET BY MOUTH ONCE DAILY.  Marland Kitchen gabapentin (NEURONTIN) 100 MG capsule Take one capsule every 8 hours prn  . HYDROcodone-acetaminophen (NORCO/VICODIN) 5-325 MG tablet Take one every 4 hours prn pain. Maximum 5 per day  . HYDROcodone-acetaminophen (NORCO/VICODIN) 5-325 MG tablet 1 every 4 hours as needed pain maximum 5/day  . HYDROcodone-acetaminophen (NORCO/VICODIN) 5-325 MG tablet Take one every 4 hours prn pain. Maximum 5 per day  .  lidocaine (XYLOCAINE JELLY) 2 % jelly Apply 1 application topically as needed. Apply to leg  . lidocaine (XYLOCAINE JELLY) 2 % jelly Apply 1 application topically as needed.  . lidocaine (XYLOCAINE) 5 % ointment Apply 1 application topically as needed. Apply to toe  . lidocaine-prilocaine (EMLA) cream APPLY TO AFFECTED AREAS2TWICE DAILY AS NEEDED.  Marland Kitchen loratadine (CLARITIN) 10 MG tablet Take 10 mg by mouth daily.  . meloxicam (MOBIC)  15 MG tablet TAKE 1 TABLET ONCE DAILY. DO NOT USE ON THE SAME DAY AS NAPROXEN.  . metFORMIN (GLUCOPHAGE-XR) 500 MG 24 hr tablet TAKE 1 TABLET BY MOUTH TWICE DAILY  . nystatin (MYCOSTATIN) 100000 UNIT/ML suspension Swish and spit 5 mls po QID  . omeprazole (PRILOSEC) 40 MG capsule TAKE ONE CAPSULE BY MOUTH ONCE DAILY.  Marland Kitchen ondansetron (ZOFRAN ODT) 8 MG disintegrating tablet Take 1 tablet 3 times daily as needed for nausea  . ondansetron (ZOFRAN ODT) 8 MG disintegrating tablet Take 1 tablet TID PRN  . ondansetron (ZOFRAN) 8 MG tablet Take one tablet po TID prn  . propranolol (INDERAL) 10 MG tablet Take one tablet po twice a day  . Semaglutide,0.25 or 0.5MG /DOS, (OZEMPIC, 0.25 OR 0.5 MG/DOSE,) 2 MG/1.5ML SOPN Inject 0.25mg  once per week for 4 weeks then inject 0.5mg  once per week.  . triamcinolone (NASACORT) 55 MCG/ACT AERO nasal inhaler USE 1 SPRAY IN EACH NOSTRIL DAILY.  Marland Kitchen triamcinolone cream (KENALOG) 0.1 % Apply to affected area twice daily as needed  . valsartan (DIOVAN) 160 MG tablet Take 1 tablet (160 mg total) by mouth daily.   No facility-administered encounter medications on file as of 11/18/2020.     Review of Systems  Constitutional: Positive for fever. Negative for chills.       Felt hot   Respiratory: Positive for cough. Negative for shortness of breath.        Cough with deep inspiration.   Cardiovascular: Negative for chest pain.  Gastrointestinal: Positive for nausea and vomiting.  Neurological: Positive for headaches.     Vitals Pulse (!) 102   Temp 98 F (36.7 C)   Resp 16   LMP 05/31/2013   SpO2 98%   Objective:   Physical Exam Vitals reviewed.  Constitutional:      General: She is not in acute distress.    Appearance: Normal appearance.  Cardiovascular:     Rate and Rhythm: Normal rate and regular rhythm.     Heart sounds: Normal heart sounds.  Pulmonary:     Effort: Pulmonary effort is normal.     Breath sounds: Normal breath sounds.  Abdominal:      General: Bowel sounds are normal.     Tenderness: There is abdominal tenderness in the left lower quadrant.     Comments: LLQ tenderness improved since Friday (on Augmentin).   Skin:    General: Skin is warm and dry.  Neurological:     General: No focal deficit present.     Mental Status: She is alert.  Psychiatric:        Behavior: Behavior normal.      Assessment and Plan   1. COVID-19 virus infection - albuterol (VENTOLIN HFA) 108 (90 Base) MCG/ACT inhaler; Inhale 2 puffs into the lungs every 6 (six) hours as needed for wheezing or shortness of breath.  Dispense: 8 g; Refill: 0  2. Cough - albuterol (VENTOLIN HFA) 108 (90 Base) MCG/ACT inhaler; Inhale 2 puffs into the lungs every 6 (six) hours as needed for wheezing or  shortness of breath.  Dispense: 8 g; Refill: 0    Abdominal pain much improved with antibiotics. Continues to have some LLQ tenderness today. Still deconditioned and had one episode of nausea, vomiting, and fever last evening.   Continues to have cough from Covid, wishes to try Albuterol inhaler.  Continue to recommend supportive therapy, adequate hydration and symptom management.  Agrees with plan of care discussed today. Understands warning signs to seek further care: chest pain, shortness of breath, any significant change in health.  Understands to follow-up on Wednesday if not able to return to work on Thursday.   Pecolia Ades, NP 11/18/20

## 2020-11-20 ENCOUNTER — Encounter: Payer: Self-pay | Admitting: Family Medicine

## 2020-11-20 ENCOUNTER — Telehealth: Payer: Self-pay | Admitting: *Deleted

## 2020-11-20 NOTE — Telephone Encounter (Signed)
Pt read message today at 2:14 no need to call

## 2020-11-20 NOTE — Telephone Encounter (Signed)
Pt states she is feeling better today. Still congested. Feeling like her old self more today than she has in weeks. Pt called to give update. Pt states she feels like going back to work tomorrow night  But does not feel she can do 12 hours but would like to do 8 hours on Thursday night, Saturday and Sunday. Then return to 12 hour shifts on the 17th would like it sent through [employer]. Would like a call back when done.

## 2020-11-20 NOTE — Telephone Encounter (Signed)
Return to work note sent via My Chart. Lisa Li

## 2020-11-27 ENCOUNTER — Ambulatory Visit: Payer: Managed Care, Other (non HMO) | Admitting: Family Medicine

## 2020-12-13 ENCOUNTER — Encounter: Payer: Self-pay | Admitting: Family Medicine

## 2020-12-13 ENCOUNTER — Other Ambulatory Visit: Payer: Self-pay | Admitting: Family Medicine

## 2021-01-03 ENCOUNTER — Ambulatory Visit (INDEPENDENT_AMBULATORY_CARE_PROVIDER_SITE_OTHER): Payer: Managed Care, Other (non HMO) | Admitting: Family Medicine

## 2021-01-03 ENCOUNTER — Other Ambulatory Visit: Payer: Self-pay

## 2021-01-03 ENCOUNTER — Encounter: Payer: Self-pay | Admitting: Family Medicine

## 2021-01-03 VITALS — BP 122/70 | HR 80 | Temp 98.1°F | Ht 63.5 in | Wt 180.0 lb

## 2021-01-03 DIAGNOSIS — E1169 Type 2 diabetes mellitus with other specified complication: Secondary | ICD-10-CM | POA: Diagnosis not present

## 2021-01-03 DIAGNOSIS — I10 Essential (primary) hypertension: Secondary | ICD-10-CM | POA: Diagnosis not present

## 2021-01-03 DIAGNOSIS — F988 Other specified behavioral and emotional disorders with onset usually occurring in childhood and adolescence: Secondary | ICD-10-CM | POA: Diagnosis not present

## 2021-01-03 DIAGNOSIS — G894 Chronic pain syndrome: Secondary | ICD-10-CM

## 2021-01-03 DIAGNOSIS — E119 Type 2 diabetes mellitus without complications: Secondary | ICD-10-CM

## 2021-01-03 DIAGNOSIS — E785 Hyperlipidemia, unspecified: Secondary | ICD-10-CM

## 2021-01-03 DIAGNOSIS — M791 Myalgia, unspecified site: Secondary | ICD-10-CM

## 2021-01-03 DIAGNOSIS — T466X5A Adverse effect of antihyperlipidemic and antiarteriosclerotic drugs, initial encounter: Secondary | ICD-10-CM | POA: Insufficient documentation

## 2021-01-03 MED ORDER — AMPHETAMINE-DEXTROAMPHET ER 15 MG PO CP24: 15.0000 mg | ORAL_CAPSULE | Freq: Every day | ORAL | 0 refills | Status: DC

## 2021-01-03 MED ORDER — METFORMIN HCL ER 750 MG PO TB24: 750.0000 mg | ORAL_TABLET | Freq: Every day | ORAL | 1 refills | Status: DC

## 2021-01-03 MED ORDER — HYDROCODONE-ACETAMINOPHEN 5-325 MG PO TABS: ORAL_TABLET | ORAL | 0 refills | Status: DC

## 2021-01-03 MED ORDER — AMLODIPINE BESYLATE 2.5 MG PO TABS: 2.5000 mg | ORAL_TABLET | Freq: Every day | ORAL | 5 refills | Status: DC

## 2021-01-03 MED ORDER — AMPHETAMINE-DEXTROAMPHET ER 15 MG PO CP24: 15.0000 mg | ORAL_CAPSULE | ORAL | 0 refills | Status: DC

## 2021-01-03 NOTE — Progress Notes (Signed)
Subjective:    Patient ID: Lisa Li, female    DOB: 1966/02/14, 55 y.o.   MRN: [id number]  HPI This patient was seen today for chronic pain  The medication list was reviewed and updated.   -Compliance with medication: takes max 5 per day  - Number patient states they take daily: max 5 per day  -when was the last dose patient took? This am  The patient was advised the importance of maintaining medication and not using illegal substances with these.  Here for refills and follow up  The patient was educated that we can provide 3 monthly scripts for their medication, it is their responsibility to follow the instructions.  Side effects or complications from medications: none  Patient is aware that pain medications are meant to minimize the severity of the pain to allow their pain levels to improve to allow for better function. They are aware of that pain medications cannot totally remove their pain.  Due for UDT ( at least once per year) : last one 03/25/20  Scale of 1 to 10 ( 1 is least 10 is most) Your pain level without the medicine: 6 -8 Your pain level with medication: 2  Scale 1 to 10 ( 1-helps very little, 10 helps very well) How well does your pain medication reduce your pain so you can function better through out the day? Brings pain level from 6 -8 to a 2  She does relate a lot of back pain neck pain knee pain states hydrocodone does help with this.  She tries to avoid taking it but will take 3 or 4/day  Thinks bp med is causing swelling and pain in legs.  We did discuss this further.  The swelling in the legs more than likely is from standing on them getting some gravity dependent swelling.  She does states she does a lot of vasodilation in her lower legs after standing  Having trouble with elbow and requesting a compounded cream for elbow that helps with pain.  Her friend was on a compounded medication that had gabapentin meloxicam lidocaine and methocarbamol makes  them she would like to try this  The patient was seen today as part of a comprehensive diabetic check up.the patient does have diabetes.  The patient follows here on a regular basis.  The patient relates medication compliance. No significant side effects to the medications. Denies any low glucose spells. Relates compliance with diet to a reasonable level. Patient does do labwork intermittently and understands the dangers of diabetes. She is trying to watch how she eats her recent A1c was 8.1 unfortunately not good needs to improve this.  She is tolerating the Metformin.  Her cholesterol is not terrible but she did not tolerate statins.  I encouraged her to consider Repatha she will discuss this with her cardiologist      Review of Systems  Constitutional: Negative for activity change, fatigue and fever.  HENT: Negative for congestion and rhinorrhea.   Respiratory: Negative for cough, chest tightness and shortness of breath.   Cardiovascular: Negative for chest pain and leg swelling.  Gastrointestinal: Negative for abdominal pain and nausea.  Skin: Negative for color change.  Neurological: Negative for dizziness and headaches.  Psychiatric/Behavioral: Negative for agitation and behavioral problems.       Objective:   Physical Exam Vitals reviewed.  Constitutional:      General: She is not in acute distress. HENT:     Head: Normocephalic.  Cardiovascular:  Rate and Rhythm: Normal rate and regular rhythm.     Heart sounds: Normal heart sounds. No murmur heard.   Pulmonary:     Effort: Pulmonary effort is normal.     Breath sounds: Normal breath sounds.  Lymphadenopathy:     Cervical: No cervical adenopathy.  Neurological:     Mental Status: She is alert.  Psychiatric:        Behavior: Behavior normal.           Assessment & Plan:  1. Essential hypertension Blood pressure overall decent control she would like to reduce amlodipine to see if the swelling would go down  so therefore new dose amlodipine 2.5 mg daily.  She will give [place] blood pressure updates within the next 2 to 3 weeks.  If blood pressure starts going up consider adding chlorthalidone. Patient will do lab work before next visit 3 months. - Basic metabolic panel  2. Hyperlipidemia associated with type 2 diabetes mellitus ([place]) Recommend that she discuss Repatha with her cardiologist.  Bump up the dose of the Metformin to try to get diabetes under better control.  3. Attention deficit disorder, unspecified hyperactivity presence Has adult ADD Does well on medicine Recently we held the medicine because of her blood pressure Blood pressure doing better Reinitiate the medicine at a lower dose 15 mg daily   4. Chronic pain syndrome The patient was seen in followup for chronic pain. A review over at their current pain status was discussed. Drug registry was checked. Prescriptions were given.  Regular follow-up recommended. Discussion was held regarding the importance of compliance with medication as well as pain medication contract.  Patient was informed that medication may cause drowsiness and should not be combined  with other medications/alcohol or street drugs. If the patient feels medication is causing altered alertness then do not drive or operate dangerous equipment.    5. Controlled type 2 diabetes mellitus without complication, without long-term current use of insulin (HCC) Check A1c before next visit - Hemoglobin A1c  6. Myalgia due to statin Not able to take a statin  Compounded cream as discussed above was sent into [facility] Follow-up 3 months  Patient states she had a mammogram couple months ago in October and it was okay

## 2021-01-07 ENCOUNTER — Ambulatory Visit: Payer: Managed Care, Other (non HMO) | Admitting: Family Medicine

## 2021-01-14 ENCOUNTER — Other Ambulatory Visit: Payer: Self-pay | Admitting: Family Medicine

## 2021-01-14 NOTE — Telephone Encounter (Signed)
Seen 01/03/21 for med check up

## 2021-03-14 ENCOUNTER — Other Ambulatory Visit: Payer: Self-pay | Admitting: Family Medicine

## 2021-04-08 ENCOUNTER — Ambulatory Visit: Payer: Managed Care, Other (non HMO) | Admitting: Family Medicine

## 2021-04-10 ENCOUNTER — Other Ambulatory Visit: Payer: Self-pay | Admitting: Obstetrics & Gynecology

## 2021-04-16 ENCOUNTER — Ambulatory Visit (HOSPITAL_BASED_OUTPATIENT_CLINIC_OR_DEPARTMENT_OTHER): Payer: PRIVATE HEALTH INSURANCE | Admitting: Obstetrics & Gynecology

## 2021-04-16 ENCOUNTER — Other Ambulatory Visit: Payer: Self-pay | Admitting: Family Medicine

## 2021-04-21 ENCOUNTER — Other Ambulatory Visit: Payer: Self-pay | Admitting: Family Medicine

## 2021-05-06 ENCOUNTER — Other Ambulatory Visit: Payer: Self-pay

## 2021-05-06 ENCOUNTER — Ambulatory Visit: Payer: Managed Care, Other (non HMO) | Admitting: Family Medicine

## 2021-05-06 VITALS — BP 130/82 | Temp 97.3°F | Wt 182.4 lb

## 2021-05-06 DIAGNOSIS — F988 Other specified behavioral and emotional disorders with onset usually occurring in childhood and adolescence: Secondary | ICD-10-CM

## 2021-05-06 DIAGNOSIS — R0981 Nasal congestion: Secondary | ICD-10-CM

## 2021-05-06 DIAGNOSIS — I10 Essential (primary) hypertension: Secondary | ICD-10-CM | POA: Diagnosis not present

## 2021-05-06 DIAGNOSIS — E1169 Type 2 diabetes mellitus with other specified complication: Secondary | ICD-10-CM | POA: Diagnosis not present

## 2021-05-06 DIAGNOSIS — R059 Cough, unspecified: Secondary | ICD-10-CM

## 2021-05-06 DIAGNOSIS — Z79891 Long term (current) use of opiate analgesic: Secondary | ICD-10-CM | POA: Diagnosis not present

## 2021-05-06 DIAGNOSIS — E7849 Other hyperlipidemia: Secondary | ICD-10-CM | POA: Diagnosis not present

## 2021-05-06 DIAGNOSIS — E119 Type 2 diabetes mellitus without complications: Secondary | ICD-10-CM

## 2021-05-06 DIAGNOSIS — E785 Hyperlipidemia, unspecified: Secondary | ICD-10-CM

## 2021-05-06 DIAGNOSIS — K3184 Gastroparesis: Secondary | ICD-10-CM

## 2021-05-06 DIAGNOSIS — R1011 Right upper quadrant pain: Secondary | ICD-10-CM

## 2021-05-06 MED ORDER — AMPHETAMINE-DEXTROAMPHET ER 15 MG PO CP24: 15.0000 mg | ORAL_CAPSULE | Freq: Every day | ORAL | 0 refills | Status: DC

## 2021-05-06 MED ORDER — HYDROCODONE-ACETAMINOPHEN 5-325 MG PO TABS: ORAL_TABLET | ORAL | 0 refills | Status: DC

## 2021-05-06 MED ORDER — AMPHETAMINE-DEXTROAMPHET ER 15 MG PO CP24: 15.0000 mg | ORAL_CAPSULE | ORAL | 0 refills | Status: DC

## 2021-05-06 MED ORDER — METFORMIN HCL ER 750 MG PO TB24
ORAL_TABLET | ORAL | 0 refills | Status: DC
Start: 2021-05-06 — End: 2021-06-17

## 2021-05-06 NOTE — Progress Notes (Signed)
Subjective:    Patient ID: Bennie Dallas, female    DOB: [birth year], 55 y.o.   MRN: [id number]  HPI This patient was seen today for chronic pain  The medication list was reviewed and updated.  Location of Pain for which the patient has been treated with regarding narcotics: Low back pain as well as hip and knee pain  Onset of this pain: Been present for many years   -Compliance with medication: Hydrocodone 5-325 mg  - Number patient states they take daily: around 4; pt hurt back last month and did take 5 daily   -when was the last dose patient took? This morning  The patient was advised the importance of maintaining medication and not using illegal substances with these.  Here for refills and follow up  The patient was educated that we can provide 3 monthly scripts for their medication, it is their responsibility to follow the instructions.  Side effects or complications from medications: none  Patient is aware that pain medications are meant to minimize the severity of the pain to allow their pain levels to improve to allow for better function. They are aware of that pain medications cannot totally remove their pain.  Due for UDT ( at least once per year) : will obtain today  Scale of 1 to 10 ( 1 is least 10 is most) Your pain level without the medicine: 8 Your pain level with medication : 4  Scale 1 to 10 ( 1-helps very little, 10 helps very well) How well does your pain medication reduce your pain so you can function better through out the day? 10  Quality of the pain: Aching throbbing radiates down the legs  Persistence of the pain: Present every day  Modifying factors: Worse with activity  Patient was seen today for ADD checkup.  This patient does have ADD.  Patient takes medications for this.  If this does help control overall symptoms.  Please see below. -weight, vital signs reviewed.  The following items were covered. -Compliance with medication : Adderall  Xr 15 mg (takes on days that she works; usually 3-4 times per week)  -Problems with completing homework, paying attention/taking good notes in school: none  -grades: n/a  - Eating patterns : eating well  -sleeping: was sleeping ok but last couple of nights has been restless   -Additional issues or questions: none       Review of Systems     Objective:   Physical Exam Gen-NAD not toxic TMS-normal bilateral T- normal no redness Chest-CTA respiratory rate normal no crackles CV RRR no murmur Skin-warm dry Neuro-grossly normal  Very nice patient with multiple complex issues      Assessment & Plan:  1. Long-term current use of opiate analgesic The patient was seen in followup for chronic pain. A review over at their current pain status was discussed. Drug registry was checked. Prescriptions were given.  Regular follow-up recommended. Discussion was held regarding the importance of compliance with medication as well as pain medication contract.  Patient was informed that medication may cause drowsiness and should not be combined  with other medications/alcohol or street drugs. If the patient feels medication is causing altered alertness then do not drive or operate dangerous equipment. Patient aware that the pain medicine is to help take the edge off the pain will not completely remove the pain.  Patient aware not to abuse medicine.  She denies feeling drug board addicted to the medicine.  Would like  to continue this. - ToxASSURE Select 13 (MW), Urine  2. Essential hypertension Blood pressure decent control continue current medications watch diet stay active - Basic Metabolic Panel (BMET) - Hemoglobin A1c - Lipid Profile - Hepatic function panel  3. Hyperlipidemia associated with type 2 diabetes mellitus ([place]) Does not tolerate statins watch diet check lab work await results - Basic Metabolic Panel (BMET) - Hemoglobin A1c - Lipid Profile - Hepatic function panel  4.  Other hyperlipidemia Does not tolerate statins - Basic Metabolic Panel (BMET) - Hemoglobin A1c - Lipid Profile - Hepatic function panel  5. Attention deficit disorder, unspecified hyperactivity presence The patient was seen today as part of the visit regarding ADD.  Patient is stable on current regimen.  Appropriate prescriptions prescribed.  Medications were reviewed with the patient as well as compliance. Side effects were checked for. Discussion regarding effectiveness was held. Prescriptions were electronically sent in.  Patient reminded to follow-up in approximately 3 months.   Plans to [facility] law with drug registry was checked and verified while present with the patient. Drug registry checked 3 scripts given patient states she benefits with the medicine  6. Gastroparesis I am concerned that she has gastroparesis has some intermittent nausea as well as discomforting feeling in the epigastric region when she tries to eat we will do a gastric emptying study if this is negative I would recommend gastroenterology consider EGD continue current PPI I do not feel CAT scan indicated currently but ultimately may need a CAT scan if all work-up is negative - Basic Metabolic Panel (BMET) - Hemoglobin A1c - Lipid Profile - Hepatic function panel - US Abdomen Limited RUQ (LIVER/GB) - Ambulatory referral to Gastroenterology  7. Cough Chest x-ray ordered.  Has an intermittent cough - Basic Metabolic Panel (BMET) - Hemoglobin A1c - Lipid Profile - Hepatic function panel - DG Chest 2 View  8. Sinus congestion More than likely this is viral.  COVID test taken await results wear a mask stay away from other till results are known - Novel Coronavirus, NAA ([facility]) - Basic Metabolic Panel (BMET) - Hemoglobin A1c - Lipid Profile - Hepatic function panel  9. Abdominal pain, acute, right upper quadrant Intermittent upper abdomen discomfort.  Check ultrasound to look at the liver.  Do not  feel CAT scan indicated currently - Basic Metabolic Panel (BMET) - Hemoglobin A1c - Lipid Profile - Hepatic function panel - US Abdomen Limited RUQ (LIVER/GB) - Ambulatory referral to Gastroenterology  10. Controlled type 2 diabetes mellitus without complication, without long-term current use of insulin (HCC) Poorly controlled diabetes repeat this again increase the metformin to 750 mg of the extended release twice daily better dietary control may need to consider insulin did not tolerate GLP-1 did not tolerate SGLT2 - Basic Metabolic Panel (BMET) - Hemoglobin A1c - Lipid Profile - Hepatic function panel

## 2021-05-07 LAB — NOVEL CORONAVIRUS, NAA: SARS-CoV-2, NAA: NOT DETECTED

## 2021-05-07 LAB — SARS-COV-2, NAA 2 DAY TAT

## 2021-05-08 ENCOUNTER — Telehealth: Payer: Self-pay | Admitting: Family Medicine

## 2021-05-08 LAB — TOXASSURE SELECT 13 (MW), URINE

## 2021-05-09 ENCOUNTER — Encounter: Payer: Self-pay | Admitting: Family Medicine

## 2021-05-09 MED ORDER — AMOXICILLIN-POT CLAVULANATE 875-125 MG PO TABS: ORAL_TABLET | ORAL | 0 refills | Status: DC

## 2021-05-09 NOTE — Telephone Encounter (Signed)
Nurses-May send in Augmentin 875 1 twice daily for 7 days

## 2021-05-09 NOTE — Addendum Note (Signed)
Addended by: Vicente Males on: 05/09/2021 08:56 AM   Modules accepted: Orders

## 2021-05-12 ENCOUNTER — Encounter: Payer: Self-pay | Admitting: Internal Medicine

## 2021-05-13 ENCOUNTER — Telehealth: Payer: Self-pay | Admitting: Family Medicine

## 2021-05-14 ENCOUNTER — Other Ambulatory Visit: Payer: Self-pay | Admitting: Family Medicine

## 2021-05-16 ENCOUNTER — Ambulatory Visit: Payer: Managed Care, Other (non HMO)

## 2021-05-19 ENCOUNTER — Other Ambulatory Visit (HOSPITAL_BASED_OUTPATIENT_CLINIC_OR_DEPARTMENT_OTHER): Payer: Self-pay | Admitting: Obstetrics & Gynecology

## 2021-05-29 ENCOUNTER — Other Ambulatory Visit: Payer: Self-pay | Admitting: Family Medicine

## 2021-06-16 ENCOUNTER — Other Ambulatory Visit: Payer: Self-pay | Admitting: Family Medicine

## 2021-06-17 ENCOUNTER — Other Ambulatory Visit: Payer: Self-pay | Admitting: Family Medicine

## 2021-06-17 NOTE — Telephone Encounter (Signed)
May have 6 months on all 

## 2021-06-25 ENCOUNTER — Other Ambulatory Visit: Payer: Self-pay | Admitting: Family Medicine

## 2021-06-25 ENCOUNTER — Encounter: Payer: Self-pay | Admitting: Family Medicine

## 2021-06-25 MED ORDER — AMOXICILLIN-POT CLAVULANATE 875-125 MG PO TABS: ORAL_TABLET | ORAL | 0 refills | Status: DC

## 2021-06-25 NOTE — Progress Notes (Signed)
ug

## 2021-07-03 ENCOUNTER — Ambulatory Visit: Payer: PRIVATE HEALTH INSURANCE | Admitting: Internal Medicine

## 2021-07-03 ENCOUNTER — Encounter: Payer: Self-pay | Admitting: Internal Medicine

## 2021-07-08 ENCOUNTER — Ambulatory Visit (HOSPITAL_BASED_OUTPATIENT_CLINIC_OR_DEPARTMENT_OTHER): Payer: PRIVATE HEALTH INSURANCE | Admitting: Obstetrics & Gynecology

## 2021-07-14 ENCOUNTER — Other Ambulatory Visit: Payer: Self-pay | Admitting: Family Medicine

## 2021-07-30 ENCOUNTER — Ambulatory Visit (HOSPITAL_BASED_OUTPATIENT_CLINIC_OR_DEPARTMENT_OTHER): Payer: PRIVATE HEALTH INSURANCE | Admitting: Obstetrics & Gynecology

## 2021-08-06 ENCOUNTER — Ambulatory Visit: Payer: Managed Care, Other (non HMO) | Admitting: Family Medicine

## 2021-08-14 ENCOUNTER — Other Ambulatory Visit: Payer: Self-pay | Admitting: Family Medicine

## 2021-08-27 ENCOUNTER — Other Ambulatory Visit: Payer: Self-pay | Admitting: Family Medicine

## 2021-08-27 ENCOUNTER — Ambulatory Visit: Payer: Managed Care, Other (non HMO) | Admitting: Family Medicine

## 2021-09-02 ENCOUNTER — Ambulatory Visit (HOSPITAL_BASED_OUTPATIENT_CLINIC_OR_DEPARTMENT_OTHER): Payer: PRIVATE HEALTH INSURANCE | Admitting: Obstetrics & Gynecology

## 2021-09-03 ENCOUNTER — Ambulatory Visit (HOSPITAL_BASED_OUTPATIENT_CLINIC_OR_DEPARTMENT_OTHER): Payer: PRIVATE HEALTH INSURANCE | Admitting: Obstetrics & Gynecology

## 2021-09-10 ENCOUNTER — Other Ambulatory Visit: Payer: Self-pay

## 2021-09-10 ENCOUNTER — Encounter: Payer: Self-pay | Admitting: Dermatology

## 2021-09-10 ENCOUNTER — Ambulatory Visit: Payer: Managed Care, Other (non HMO) | Admitting: Dermatology

## 2021-09-10 DIAGNOSIS — L570 Actinic keratosis: Secondary | ICD-10-CM

## 2021-09-10 DIAGNOSIS — D23111 Other benign neoplasm of skin of right upper eyelid, including canthus: Secondary | ICD-10-CM | POA: Diagnosis not present

## 2021-09-10 DIAGNOSIS — G5712 Meralgia paresthetica, left lower limb: Secondary | ICD-10-CM | POA: Diagnosis not present

## 2021-09-10 DIAGNOSIS — D239 Other benign neoplasm of skin, unspecified: Secondary | ICD-10-CM

## 2021-09-10 NOTE — Patient Instructions (Signed)
Discussed tingling stinging itching on the left outer 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  Patient will look for an over-the-counter anti-itch lotion that contains the ingredient pramoxine (CeraVe itch relief or other) and use this daily after bathing and as needed for itching.

## 2021-09-13 ENCOUNTER — Other Ambulatory Visit: Payer: Self-pay | Admitting: Family Medicine

## 2021-09-17 ENCOUNTER — Encounter (HOSPITAL_BASED_OUTPATIENT_CLINIC_OR_DEPARTMENT_OTHER): Payer: Self-pay | Admitting: Obstetrics & Gynecology

## 2021-09-17 ENCOUNTER — Ambulatory Visit (INDEPENDENT_AMBULATORY_CARE_PROVIDER_SITE_OTHER): Payer: Managed Care, Other (non HMO) | Admitting: Obstetrics & Gynecology

## 2021-09-17 ENCOUNTER — Other Ambulatory Visit (HOSPITAL_COMMUNITY)
Admission: RE | Admit: 2021-09-17 | Discharge: 2021-09-17 | Disposition: A | Discharge status: Home / Self Care | Payer: Managed Care, Other (non HMO) | Source: Ambulatory Visit | Attending: Obstetrics & Gynecology | Admitting: Obstetrics & Gynecology

## 2021-09-17 ENCOUNTER — Other Ambulatory Visit: Payer: Self-pay

## 2021-09-17 VITALS — BP 165/94 | HR 89 | Ht 63.0 in | Wt 194.8 lb

## 2021-09-17 DIAGNOSIS — E1169 Type 2 diabetes mellitus with other specified complication: Secondary | ICD-10-CM

## 2021-09-17 DIAGNOSIS — Z01419 Encounter for gynecological examination (general) (routine) without abnormal findings: Secondary | ICD-10-CM

## 2021-09-17 DIAGNOSIS — N9089 Other specified noninflammatory disorders of vulva and perineum: Secondary | ICD-10-CM | POA: Diagnosis present

## 2021-09-17 DIAGNOSIS — E785 Hyperlipidemia, unspecified: Secondary | ICD-10-CM

## 2021-09-17 DIAGNOSIS — I2510 Atherosclerotic heart disease of native coronary artery without angina pectoris: Secondary | ICD-10-CM

## 2021-09-17 DIAGNOSIS — Z79890 Hormone replacement therapy (postmenopausal): Secondary | ICD-10-CM

## 2021-09-17 DIAGNOSIS — E7800 Pure hypercholesterolemia, unspecified: Secondary | ICD-10-CM

## 2021-09-17 DIAGNOSIS — Z90710 Acquired absence of both cervix and uterus: Secondary | ICD-10-CM

## 2021-09-17 DIAGNOSIS — E119 Type 2 diabetes mellitus without complications: Secondary | ICD-10-CM

## 2021-09-17 MED ORDER — FLUCONAZOLE 150 MG PO TABS: ORAL_TABLET | ORAL | 0 refills | Status: DC

## 2021-09-17 MED ORDER — ESTRADIOL 0.1 MG/24HR TD PTTW: 1.0000 | MEDICATED_PATCH | TRANSDERMAL | 3 refills | Status: DC

## 2021-09-17 MED ORDER — TERCONAZOLE 0.4 % VA CREA
1.0000 | TOPICAL_CREAM | Freq: Every day | VAGINAL | 0 refills | Status: DC
Start: 2021-09-17 — End: 2023-10-05

## 2021-09-17 NOTE — Progress Notes (Signed)
55 y.o. G0P0 Single White or Caucasian female here for annual exam.  Denies vaginal bleeding.  Has started statins this year.  Has not tolerated this well.  Is getting ready to start [place].  Seeing cardiologist.    Using clobetasol on lichen sclerosus and using some antifungal.  Feels the antifungal does help.  Has been using the clobetasol daily.  Knows this can thin the skin but just can't seem to get itching to go away.  Also, needs health assessment form filled out today.  Patient's last menstrual period was 05/31/2013.          The current method of family planning is status post hysterectomy.    Smoker:  no  Health Maintenance: Pap:  not indicated History of abnormal Pap:  no MMG:  02/24/2020 Negative.  Aware this is due.   Colonoscopy:  08/05/2016.  Follow up 10 years BMD:   plan in the next 1-2 years Screening Labs: Does with Dr. Wolfgang Phoenix   reports that she has never smoked. She has never used smokeless tobacco. She reports that she does not drink alcohol and does not use drugs.  Past Medical History:  Diagnosis Date   Carpal tunnel syndrome on both sides 06/13/2019   Chronic pain 05/09/2019   Coronary artery disease 05/09/2019   Abnormal cardiac calcium score with lesion   Frequent UTI    Kidney stone    Meniere's disease 2017   Dr. Benjamine Mola    MGUS (monoclonal gammopathy of unknown significance)    Migraine headache    Myalgia    Chronic   Pre-diabetes     Past Surgical History:  Procedure Laterality Date   APPENDECTOMY     CARPAL TUNNEL RELEASE Right 06/13/2019   CHOLECYSTECTOMY     COLONOSCOPY W/ ENDOSCOPIC [place]  09/27/06   02/2011    CYSTOSCOPY N/A 07/03/2013   Procedure: CYSTOSCOPY;  Surgeon: Lyman Speller, MD;  Location: [facility];  Service: Gynecology;  Laterality: N/A;   ESOPHAGOGASTRODUODENOSCOPY     ROBOTIC ASSISTED TOTAL HYSTERECTOMY WITH BILATERAL SALPINGO OOPHERECTOMY Bilateral 07/03/2013   Procedure: ROBOTIC ASSISTED TOTAL HYSTERECTOMY WITH BILATERAL  SALPINGO OOPHORECTOMY;  Surgeon: Lyman Speller, MD;  Location: [facility];  Service: Gynecology;  Laterality: Bilateral;   TONSILLECTOMY  1973    Current Outpatient Medications  Medication Sig Dispense Refill   amLODipine (NORVASC) 2.5 MG tablet TAKE ONE TABLET BY MOUTH ONCE DAILY. 30 tablet 0   amphetamine-dextroamphetamine (ADDERALL XR) 15 MG 24 hr capsule Take 1 capsule by mouth every morning. 30 capsule 0   amphetamine-dextroamphetamine (ADDERALL XR) 15 MG 24 hr capsule Take 1 capsule by mouth daily. 30 capsule 0   amphetamine-dextroamphetamine (ADDERALL XR) 15 MG 24 hr capsule Take 1 capsule by mouth daily. 30 capsule 0   betamethasone dipropionate (DIPROLENE) 0.05 % cream Apply topically 2 (two) times daily. 60 g 5   chlorzoxazone (PARAFON FORTE DSC) 500 MG tablet Take 1 tablet (500 mg total) by mouth 3 (three) times daily as needed for muscle spasms. 45 tablet 4   diazepam (VALIUM) 5 MG tablet TAKE 1 TABLET AT BEDTIME AS NEEDED. 30 tablet 5   diclofenac sodium (VOLTAREN) 1 % GEL Apply 4 g topically 4 (four) times daily. 1 Tube 10   docusate sodium (COLACE) 100 MG capsule Take 100 mg by mouth as needed.      estradiol (VIVELLE-DOT) 0.1 MG/24HR patch PLACE 1 PATCH ONTO THE SKIN 2 TIMES A WEEK 8 patch 1   FLUoxetine (PROZAC) 10  MG capsule TAKE ONE CAPSULE BY MOUTH ONCE DAILY. 30 capsule 5   gabapentin (NEURONTIN) 100 MG capsule Take one capsule every 8 hours prn 90 capsule 5   HYDROcodone-acetaminophen (NORCO/VICODIN) 5-325 MG tablet Take one every 4 hours prn pain. Maximum 5 per day 120 tablet 0   HYDROcodone-acetaminophen (NORCO/VICODIN) 5-325 MG tablet 1 every 4 hours as needed pain maximum 5/day 120 tablet 0   HYDROcodone-acetaminophen (NORCO/VICODIN) 5-325 MG tablet Take one every 4 hours prn pain. Maximum 5 per day 120 tablet 0   lidocaine (XYLOCAINE JELLY) 2 % jelly Apply 1 application topically as needed. Apply to leg 30 mL 12   lidocaine (XYLOCAINE JELLY) 2 % jelly Apply 1  application topically as needed. 30 mL 12   lidocaine (XYLOCAINE) 5 % ointment Apply 1 application topically as needed. Apply to toe 35.44 g 5   lidocaine-prilocaine (EMLA) cream APPLY TO AFFECTED AREAS2TWICE DAILY AS NEEDED.     loratadine (CLARITIN) 10 MG tablet Take 10 mg by mouth daily.     meloxicam (MOBIC) 15 MG tablet TAKE 1 TABLET ONCE DAILY. DO NOT USE ON THE SAME DAY AS NAPROXEN. 30 tablet 5   metFORMIN (GLUCOPHAGE-XR) 500 MG 24 hr tablet TAKE 1 TABLET BY MOUTH TWICE DAILY 60 tablet 0   metFORMIN (GLUCOPHAGE-XR) 750 MG 24 hr tablet TAKE 1 TABLET TWICE DAILY WITH BREAKFAST. 60 tablet 5   nystatin (MYCOSTATIN) 100000 UNIT/ML suspension Swish and spit 5 mls po QID 473 mL 0   omeprazole (PRILOSEC) 40 MG capsule TAKE ONE CAPSULE BY MOUTH ONCE DAILY. 30 capsule 0   ondansetron (ZOFRAN) 8 MG tablet Take one tablet po TID prn 15 tablet 2   triamcinolone (NASACORT) 55 MCG/ACT AERO nasal inhaler USE 1 SPRAY IN EACH NOSTRIL DAILY. 1 Inhaler 5   triamcinolone cream (KENALOG) 0.1 % Apply to affected area twice daily as needed 45 g 4   valsartan (DIOVAN) 160 MG tablet TAKE ONE TABLET BY MOUTH ONCE DAILY. 30 tablet 0   albuterol (VENTOLIN HFA) 108 (90 Base) MCG/ACT inhaler Inhale 2 puffs into the lungs every 6 (six) hours as needed for wheezing or shortness of breath. (Patient not taking: Reported on 09/17/2021) 8 g 0   methocarbamol (ROBAXIN) 500 MG tablet Take 500 mg by mouth 3 (three) times daily as needed. (Patient not taking: Reported on 09/17/2021)     No current facility-administered medications for this visit.    Family History  Problem Relation Age of Onset   COPD Mother        Deceased, 56   Prostate cancer Father        Deceased, 108   Emphysema Sister    Healthy Brother    Liver cancer Other        and pancreatic cancer-paternal cousin   Liver cancer Other        paternal cousin   Breast cancer Cousin     Review of Systems  All other systems reviewed and are negative.  Exam:    BP (!) 165/94 (BP Location: Left Arm, Patient Position: Sitting, Cuff Size: Large)   Pulse 89   Ht 5\' 3"  (1.6 m) Comment: reported  Wt 194 lb 12.8 oz (88.4 kg)   LMP 05/31/2013   BMI 34.51 kg/m   Height: 5\' 3"  (160 cm) (reported)  General appearance: alert, cooperative and appears stated age Head: Normocephalic, without obvious abnormality, atraumatic Neck: no adenopathy, supple, symmetrical, trachea midline and thyroid normal to inspection and palpation Lungs: clear to  auscultation bilaterally Breasts: normal appearance, no masses or tenderness Heart: regular rate and rhythm Abdomen: soft, non-tender; bowel sounds normal; no masses,  no organomegaly Extremities: extremities normal, atraumatic, no cyanosis or edema Skin: Skin color, texture, turgor normal. No rashes or lesions Lymph nodes: Cervical, supraclavicular, and axillary nodes normal. No abnormal inguinal nodes palpated Neurologic: Grossly normal   Pelvic: External genitalia:  thickening of external labia majora tissue with findings d/w itching, minimal hypopigmentation, no open lesions              Urethra:  normal appearing urethra with no masses, tenderness or lesions              Bartholins and Skenes: normal                 Vagina: erythematous mucosa with whitish discharge, no lesions              Cervix: absent              Pap taken: No. Bimanual Exam:  Uterus:  uterus absent              Adnexa: no mass, fullness, tenderness               Rectovaginal: Confirms               Anus:  normal sphincter tone, no lesions  Chaperone, Octaviano Batty, CMA, was present for exam.  Assessment/Plan: 1. Well woman exam with routine gynecological exam - pap smear not indicated - MMG due.  Pt aware.d - Consider BMD in 1 year colo  2. Vulvar irritation - Cervicovaginal ancillary only( [place])                                                                                                                                                                                                                                                                                                                                                                                                                                                                                                                                                                                                                                                                                                                                  -  terconazole (TERAZOL 7) 0.4 % vaginal cream; Place 1 applicator vaginally at bedtime.  Dispense: 45 g; Refill: 0  3. Hormone replacement therapy (HRT)  - estradiol (VIVELLE-DOT) 0.1 MG/24HR patch; Place 1 patch  0.1 mg total) onto the skin 2 (two) times a week.  Dispense: 8 patch; Refill: 3  4. Hyperlipidemia associated with type 2 diabetes mellitus ([place])  5. Controlled type 2 diabetes mellitus without complication, without long-term current use of insulin ([place])  6. H/O: hysterectomy - 06/2013  7. Coronary artery disease involving native coronary artery of native heart without angina pectoris - is seeing her cardiologist at the end of the month.  Will discussed continued HRT at that time ro ensure this is ok to continue. - rx for tow to trk..............  8. Pure hypercholesterolemia

## 2021-09-18 LAB — CERVICOVAGINAL ANCILLARY ONLY
Bacterial Vaginitis (gardnerella): NEGATIVE
Candida Glabrata: NEGATIVE
Candida Vaginitis: POSITIVE — AB
Comment: NEGATIVE
Comment: NEGATIVE
Comment: NEGATIVE

## 2021-09-20 LAB — HM MAMMOGRAPHY: HM Mammogram: ABNORMAL — AB (ref 0–4)

## 2021-09-24 ENCOUNTER — Ambulatory Visit: Payer: Managed Care, Other (non HMO) | Admitting: Family Medicine

## 2021-09-24 ENCOUNTER — Other Ambulatory Visit: Payer: Self-pay

## 2021-09-24 ENCOUNTER — Encounter: Payer: Self-pay | Admitting: Family Medicine

## 2021-09-24 VITALS — BP 148/74 | HR 90 | Ht 63.0 in | Wt 183.6 lb

## 2021-09-24 DIAGNOSIS — R928 Other abnormal and inconclusive findings on diagnostic imaging of breast: Secondary | ICD-10-CM

## 2021-09-24 DIAGNOSIS — Z79891 Long term (current) use of opiate analgesic: Secondary | ICD-10-CM

## 2021-09-24 DIAGNOSIS — E1169 Type 2 diabetes mellitus with other specified complication: Secondary | ICD-10-CM | POA: Diagnosis not present

## 2021-09-24 DIAGNOSIS — F988 Other specified behavioral and emotional disorders with onset usually occurring in childhood and adolescence: Secondary | ICD-10-CM

## 2021-09-24 DIAGNOSIS — K760 Fatty (change of) liver, not elsewhere classified: Secondary | ICD-10-CM

## 2021-09-24 DIAGNOSIS — E785 Hyperlipidemia, unspecified: Secondary | ICD-10-CM

## 2021-09-24 DIAGNOSIS — I10 Essential (primary) hypertension: Secondary | ICD-10-CM

## 2021-09-24 DIAGNOSIS — R252 Cramp and spasm: Secondary | ICD-10-CM

## 2021-09-24 DIAGNOSIS — L989 Disorder of the skin and subcutaneous tissue, unspecified: Secondary | ICD-10-CM

## 2021-09-24 NOTE — Progress Notes (Signed)
Subjective:    Patient ID: Lisa Li, female    DOB: 06/21/1966, 55 y.o.   MRN: [id number]  HPI This patient was seen today for chronic pain  The medication list was reviewed and updated.  Location of Pain for which the patient has been treated with regarding narcotics: neck, lumbar  Onset of this pain: years ago   -Compliance with medication: good  - Number patient states they take daily: 3 to 4  -when was the last dose patient took? Earlier today  The patient was advised the importance of maintaining medication and not using illegal substances with these.  Here for refills and follow up  The patient was educated that we can provide 3 monthly scripts for their medication, it is their responsibility to follow the instructions.  Side effects or complications from medications: none  Patient is aware that pain medications are meant to minimize the severity of the pain to allow their pain levels to improve to allow for better function. They are aware of that pain medications cannot totally remove their pain.  Due for UDT ( at least once per year) : summer 2022  Scale of 1 to 10 ( 1 is least 10 is most) Your pain level without the medicine: 8 Your pain level with medication 3  Scale 1 to 10 ( 1-helps very little, 10 helps very well) How well does your pain medication reduce your pain so you can function better through out the day? 7-8  Quality of the pain: burning  Persistence of the pain: all day  Modifying factors: increased with activity   This patient has adult ADD. Takes medication responsibly. Medication does help the patient focus in be more functional. Patient relates that they are or not abusing the medication or misusing the medication. The patient understands that if they're having any negative side effects such as elevated high blood pressure severe headaches they would need stop the medication follow-up immediately. They also understand that the  prescriptions are to last for 3 months then the patient will need to follow-up before having further prescriptions.  Patient compliance good  Does medication help patient function /attention better  yes most definetely  Side effects  none      Review of Systems     Objective:   Physical Exam  General-in no acute distress Eyes-no discharge Lungs-respiratory rate normal, CTA CV-no murmurs,RRR Extremities skin warm dry no edema Neuro grossly normal Behavior normal, alert       Assessment & Plan:  1. Long-term current use of opiate analgesic The patient was seen in followup for chronic pain. A review over at their current pain status was discussed. Drug registry was checked. Prescriptions were given.  Regular follow-up recommended. Discussion was held regarding the importance of compliance with medication as well as pain medication contract.  Patient was informed that medication may cause drowsiness and should not be combined  with other medications/alcohol or street drugs. If the patient feels medication is causing altered alertness then do not drive or operate dangerous equipment.  Should be noted that the patient appears to be meeting appropriate use of opioids and response.  Evidenced by improved function and decent pain control without significant side effects and no evidence of overt aberrancy issues.  Upon discussion with the patient today they understand that opioid therapy is optional and they feel that the pain has been refractory to reasonable conservative measures and is significant and affecting quality of life enough to warrant ongoing  therapy and wishes to continue opioids.  Refills were provided.  - TSH - T4, free - Magnesium - Potassium  2. Essential hypertension HTN- patient seen for follow-up regarding HTN.  Diet, medication compliance, appropriate labs and refills were completed.  Importance of keeping blood pressure under good control to lessen the risk of  complications discussed  - TSH - T4, free - Magnesium - Potassium  3. Hyperlipidemia associated with type 2 diabetes mellitus (HCC) On repatha - TSH - T4, free - Magnesium - Potassium  4. Abnormal mammogram Pt will get further imaging  5. Skin lesion Has lesion near skin referral to plastic surgery [place] - Ambulatory referral to Ophthalmology  6. Fatty liver Lose weight, eat healthy, portion - TSH - T4, free - Magnesium - Potassium  7. Attention deficit disorder, unspecified hyperactivity presence ADD meds using infrequrently  8. Muscle cramps Stretching and activty Cut back on Gatorade due to salt - TSH - T4, free - Magnesium - Potassium  Recheck BP in 4 weeks  DM add glipizide If low sugars stop had side effects with GLP! Goal get A1C less than 7 may need insulin

## 2021-09-24 NOTE — Patient Instructions (Signed)

## 2021-09-25 ENCOUNTER — Encounter: Payer: Self-pay | Admitting: Family Medicine

## 2021-09-26 ENCOUNTER — Telehealth: Payer: Self-pay | Admitting: Family Medicine

## 2021-09-26 ENCOUNTER — Other Ambulatory Visit: Payer: Self-pay | Admitting: Family Medicine

## 2021-09-26 MED ORDER — HYDROCODONE-ACETAMINOPHEN 5-325 MG PO TABS: ORAL_TABLET | ORAL | 0 refills | Status: DC

## 2021-09-26 MED ORDER — GLIPIZIDE ER 2.5 MG PO TB24: 2.5000 mg | ORAL_TABLET | Freq: Every day | ORAL | 1 refills | Status: DC

## 2021-09-26 MED ORDER — AMLODIPINE BESYLATE 5 MG PO TABS: 5.0000 mg | ORAL_TABLET | Freq: Every day | ORAL | 3 refills | Status: DC

## 2021-09-26 MED ORDER — AMPHETAMINE-DEXTROAMPHET ER 15 MG PO CP24: 15.0000 mg | ORAL_CAPSULE | Freq: Every day | ORAL | 0 refills | Status: DC

## 2021-09-26 MED ORDER — HYDROCODONE-ACETAMINOPHEN 5-325 MG PO TABS
ORAL_TABLET | ORAL | 0 refills | Status: DC
Start: 2021-09-26 — End: 2021-12-30

## 2021-09-26 MED ORDER — AMPHETAMINE-DEXTROAMPHET ER 15 MG PO CP24: 15.0000 mg | ORAL_CAPSULE | ORAL | 0 refills | Status: DC

## 2021-09-26 NOTE — Telephone Encounter (Signed)
Returned patient's call and informed her all her prescriptions were sent in to [facility] . She asks about glipizide being sent in for blood sugars , please advise

## 2021-09-26 NOTE — Telephone Encounter (Signed)
Patient states she was seen a few days ago and thought medication was going to be sent in to [facility]. She does not remember the names of the medication.  CB# [phone]

## 2021-09-26 NOTE — Telephone Encounter (Signed)
Tried calling, mailbox is full

## 2021-09-26 NOTE — Addendum Note (Signed)
Addended by: Sallee Lange A on: 09/26/2021 09:50 AM   Modules accepted: Orders

## 2021-09-26 NOTE — Telephone Encounter (Signed)
Glipizide 2.5 mg XL this was sent in today.  1 daily.  If any low sugar spells stop medication.  May send [place] update within the next few weeks.  Regarding glucose readings

## 2021-09-28 ENCOUNTER — Encounter: Payer: Self-pay | Admitting: Dermatology

## 2021-09-28 NOTE — Progress Notes (Signed)
° °  New Patient   Subjective  Lisa Li is a 55 y.o. female who presents for the following: Skin Problem (New place on right outer eye x 3 months- no itch or bleed & left arm- pcp wants checked).  Growth right, crusts, itching on leg Location:  Duration:  Quality:  Associated Signs/Symptoms: Modifying Factors:  Severity:  Timing: Context:    The following portions of the chart were reviewed this encounter and updated as appropriate:  Tobacco   Allergies   Meds   Problems   Med Hx   Surg Hx   Fam Hx       Objective  Well appearing patient in no apparent distress; mood and affect are within normal limits. Right Upper Eyelid Margin Two millimeter partly translucent dermal tense vesicle right outer canthus  Dorsum of Nose, Left Forearm - Anterior, Right Dorsal Hand (4), Right Forearm - Anterior Multiple gritty crusts nose and hands    A focused examination was performed including head, neck, arms, legs, back. Relevant physical exam findings are noted in the Assessment and Plan.   Assessment & Plan  Hydrocystoma Right Upper Eyelid Margin  Patient told that hydrocystoma's are fairly common, benign lesions and hers is a typical location.  Because cystic BCC may look similar, she will return if there is growth or bleeding.  And  AK (actinic keratosis) (7) Left Forearm - Anterior; Right Forearm - Anterior; Right Dorsal Hand (4); Dorsum of Nose  Destruction of lesion - Dorsum of Nose, Left Forearm - Anterior, Right Dorsal Hand, Right Forearm - Anterior Complexity: simple   Destruction method: cryotherapy   Informed consent: discussed and consent obtained   Timeout:  patient name, date of birth, surgical site, and procedure verified Lesion destroyed using liquid nitrogen: Yes   Cryotherapy cycles:  3 Outcome: patient tolerated procedure well with no complications    Discussed tingling stinging itching on the left outer 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  Patient will  look for an over-the-counter anti-itch lotion that contains the ingredient pramoxine (CeraVe itch relief or other) and use this daily after bathing and as needed for itching.

## 2021-09-30 ENCOUNTER — Other Ambulatory Visit: Payer: Self-pay | Admitting: Family Medicine

## 2021-09-30 NOTE — Telephone Encounter (Signed)
Patient advised the Glipizide was called in.  If any low sugar spells stop medication.  May send [place] update within the next few weeks.  Regarding glucose readings. Patient verbalized understanding and stated she has an appointment in a few weeks with Dr Nicki Reaper and will bring reading to appt.

## 2021-10-08 ENCOUNTER — Telehealth: Payer: Self-pay | Admitting: Family Medicine

## 2021-10-08 NOTE — Telephone Encounter (Signed)
Nurses-please talk with them and see what they need?  If they need something sign lets do it

## 2021-10-08 NOTE — Telephone Encounter (Signed)
Please advise. Thank you

## 2021-10-08 NOTE — Telephone Encounter (Signed)
Myriam Jacobson from the [facility] [place], [phone], called and stated they have not received the orders from Dr Nicki Reaper to do the breast biopsy for pt.

## 2021-10-08 NOTE — Telephone Encounter (Signed)
Attempted to contact [facility] but no answer

## 2021-10-09 NOTE — Telephone Encounter (Signed)
[facility] contacted; spoke with [facility]. Dawn states they did find pt order. Nothing further needed from [place].

## 2021-10-22 ENCOUNTER — Ambulatory Visit (INDEPENDENT_AMBULATORY_CARE_PROVIDER_SITE_OTHER): Payer: Managed Care, Other (non HMO) | Admitting: Family Medicine

## 2021-10-22 ENCOUNTER — Other Ambulatory Visit: Payer: Self-pay

## 2021-10-22 ENCOUNTER — Encounter: Payer: Self-pay | Admitting: Family Medicine

## 2021-10-22 VITALS — BP 128/85 | HR 88 | Temp 98.6°F | Wt 178.8 lb

## 2021-10-22 DIAGNOSIS — E785 Hyperlipidemia, unspecified: Secondary | ICD-10-CM | POA: Diagnosis not present

## 2021-10-22 DIAGNOSIS — E1169 Type 2 diabetes mellitus with other specified complication: Secondary | ICD-10-CM

## 2021-10-22 DIAGNOSIS — C50912 Malignant neoplasm of unspecified site of left female breast: Secondary | ICD-10-CM | POA: Diagnosis not present

## 2021-10-22 DIAGNOSIS — I10 Essential (primary) hypertension: Secondary | ICD-10-CM

## 2021-10-22 NOTE — Progress Notes (Signed)
° °  Subjective:    Patient ID: Lisa Li, female    DOB: 1966/07/22, 56 y.o.   MRN: [id number]  HPI Very nice patient Dealing with a difficult diagnosis of recent diagnosis of breast cancer Has underlying health issues including chronic pain syndrome related to orthopedic back issues Diabetes which has been difficult to control but she is working hard on it Hyperlipidemia associated with diabetes for which she cannot tolerate statins but recently starting Repatha Mild anxiety understandably somewhat with current diagnosis Patient was on estrogen no longer on estrogen  Patient does have history of ADD taking medication for this  Pt here for follow up on blood pressure. Pt states blood pressure has been overall good.   Pt recently diagnosed with breast cancer.    Review of Systems     Objective:   Physical Exam  General-in no acute distress Eyes-no discharge Lungs-respiratory rate normal, CTA CV-no murmurs,RRR Extremities skin warm dry no edema Neuro grossly normal Behavior normal, alert       Assessment & Plan:  1. Essential hypertension Blood pressure decent control continue current measures send [place] readings over the next couple weeks  2. Hyperlipidemia associated with type 2 diabetes mellitus ([place]) Start the [place].  Hopefully she will tolerate it well  3. Infiltrating ductal carcinoma of left breast [facility]) Seeing oncology and surgery through [facility].  Continue these measures.  More than likely will need radiation.  Patient to keep [place] posted on how things are going  Follow-up for chronic pain and ADD within 2 months  If she needs additional labs completed for the sake of presurgery or other issues she is to let us know then we can order

## 2021-10-22 NOTE — Patient Instructions (Addendum)
Pneumococcal 20 at your convenience here at the office  Please send [place] an update regarding your sugar readings within 10 days  If the surgeon requires labs to be drawn before your surgery regarding diabetes let me know and we will order that  Shingrix and shingles prevention: know the facts!   Shingrix is a very effective vaccine to prevent shingles.   Shingles is a reactivation of chickenpox -more than 99% of Americans born before 1980 have had chickenpox even if they do not remember it. One in every 10 people who get shingles have severe long-lasting nerve pain as a result.   33 out of a 100 older adults will get shingles if they are unvaccinated.     This vaccine is very important for your health This vaccine is indicated for anyone 50 years or older. You can get this vaccine even if you have already had shingles because you can get the disease more than once in a lifetime.  Your risk for shingles and its complications increases with age.  This vaccine has 2 doses.  The second dose would be 2 to 6 months after the first dose.  If you had Zostavax vaccine in the past you should still get Shingrix. ( Zostavax is only 70% effective and it loses significant strength over a few years .)  This vaccine is given through the pharmacy.  The cost of the vaccine is through your insurance. The pharmacy can inform you of the total costs.  Common side effects including soreness in the arm, some redness and swelling, also some feel fatigue muscle soreness headache low-grade fever.  Side effects typically go away within 2 to 3 days. Remember-the pain from shingles can last a lifetime but these side effects of the vaccine will only last a few days at most. It is very important to get both doses in order to protect yourself fully.   Please get this vaccine at your earliest convenience at your trusted pharmacy.

## 2021-10-29 ENCOUNTER — Other Ambulatory Visit: Payer: Self-pay | Admitting: Family Medicine

## 2021-10-29 MED ORDER — VALSARTAN 160 MG PO TABS: 160.0000 mg | ORAL_TABLET | Freq: Every day | ORAL | 3 refills | Status: DC

## 2021-10-31 ENCOUNTER — Other Ambulatory Visit: Payer: Self-pay | Admitting: Family Medicine

## 2021-11-11 ENCOUNTER — Encounter: Payer: Self-pay | Admitting: Family Medicine

## 2021-11-11 ENCOUNTER — Telehealth: Payer: Self-pay | Admitting: *Deleted

## 2021-11-11 NOTE — Telephone Encounter (Signed)
Sent patient a MyChart message

## 2021-11-11 NOTE — Telephone Encounter (Signed)
Beth with [employer] [employer], [phone] and stated Quilla had called and they let her know the delivery time cut off for her area was 4:00 pm. I told her all the nurses were busy with patients. Beth wanted to know if he was calling something in.   [phone]

## 2021-11-11 NOTE — Telephone Encounter (Signed)
Patient states she has a huge, painful hemorrhoid, pain is at a 10.  Patient says she has breast cancer and will be having a lumpectomy in the morning. She wants to know if you can send in something for the hemorrhoid to [facility].

## 2021-11-15 ENCOUNTER — Other Ambulatory Visit: Payer: Self-pay | Admitting: Family Medicine

## 2021-11-15 DIAGNOSIS — E119 Type 2 diabetes mellitus without complications: Secondary | ICD-10-CM

## 2021-12-15 ENCOUNTER — Other Ambulatory Visit: Payer: Self-pay | Admitting: Family Medicine

## 2021-12-15 NOTE — Telephone Encounter (Signed)
6 months on each 

## 2021-12-15 NOTE — Telephone Encounter (Signed)
See previous message please ?

## 2021-12-29 ENCOUNTER — Other Ambulatory Visit: Payer: Self-pay | Admitting: Family Medicine

## 2022-01-14 ENCOUNTER — Ambulatory Visit: Payer: Managed Care, Other (non HMO) | Admitting: Family Medicine

## 2022-01-22 ENCOUNTER — Telehealth: Payer: Self-pay | Admitting: *Deleted

## 2022-01-22 NOTE — Telephone Encounter (Signed)
Let her know we will need to touch base on this when she comes in May 1st in order to get a pre-CERT ?We will try to fit everything in ?Although there is a limit to have any issues we contact: 20 minutes ?

## 2022-01-22 NOTE — Telephone Encounter (Signed)
Patient states that back in July it was recommended that she have a gastric emptying study for her stomach issues. Patient states she has been having a lot go on and has not been able to do it yet and is now interested in proceeding- the test will need a precert thru her insurance and she has an appt scheduled the 1st of May for check up- do you think she should make a separate appt for this issue to have notes for precert or is it ok to handle at May 1st appt- will also need new order ?

## 2022-01-26 NOTE — Telephone Encounter (Signed)
Left message to return call 

## 2022-01-26 NOTE — Telephone Encounter (Signed)
Patient notified

## 2022-02-09 ENCOUNTER — Ambulatory Visit (INDEPENDENT_AMBULATORY_CARE_PROVIDER_SITE_OTHER): Payer: Managed Care, Other (non HMO) | Admitting: Family Medicine

## 2022-02-09 ENCOUNTER — Encounter: Payer: Self-pay | Admitting: Family Medicine

## 2022-02-09 VITALS — BP 138/88 | HR 87 | Temp 98.1°F | Ht 63.0 in | Wt 177.0 lb

## 2022-02-09 DIAGNOSIS — E1169 Type 2 diabetes mellitus with other specified complication: Secondary | ICD-10-CM

## 2022-02-09 DIAGNOSIS — G894 Chronic pain syndrome: Secondary | ICD-10-CM

## 2022-02-09 DIAGNOSIS — I10 Essential (primary) hypertension: Secondary | ICD-10-CM

## 2022-02-09 DIAGNOSIS — K3184 Gastroparesis: Secondary | ICD-10-CM

## 2022-02-09 DIAGNOSIS — R140 Abdominal distension (gaseous): Secondary | ICD-10-CM

## 2022-02-09 DIAGNOSIS — E119 Type 2 diabetes mellitus without complications: Secondary | ICD-10-CM

## 2022-02-09 DIAGNOSIS — E785 Hyperlipidemia, unspecified: Secondary | ICD-10-CM

## 2022-02-09 DIAGNOSIS — R112 Nausea with vomiting, unspecified: Secondary | ICD-10-CM

## 2022-02-09 DIAGNOSIS — R232 Flushing: Secondary | ICD-10-CM

## 2022-02-09 DIAGNOSIS — F988 Other specified behavioral and emotional disorders with onset usually occurring in childhood and adolescence: Secondary | ICD-10-CM

## 2022-02-09 MED ORDER — AMPHETAMINE-DEXTROAMPHET ER 15 MG PO CP24: 15.0000 mg | ORAL_CAPSULE | ORAL | 0 refills | Status: DC

## 2022-02-09 MED ORDER — FLUOXETINE HCL 10 MG PO CAPS: 10.0000 mg | ORAL_CAPSULE | Freq: Every day | ORAL | 5 refills | Status: DC

## 2022-02-09 MED ORDER — HYDROCODONE-ACETAMINOPHEN 5-325 MG PO TABS: ORAL_TABLET | ORAL | 0 refills | Status: DC

## 2022-02-09 MED ORDER — AMOXICILLIN-POT CLAVULANATE 875-125 MG PO TABS: 1.0000 | ORAL_TABLET | Freq: Two times a day (BID) | ORAL | 0 refills | Status: DC

## 2022-02-09 MED ORDER — AMPHETAMINE-DEXTROAMPHET ER 15 MG PO CP24: 15.0000 mg | ORAL_CAPSULE | Freq: Every day | ORAL | 0 refills | Status: DC

## 2022-02-09 MED ORDER — HYDROCODONE-ACETAMINOPHEN 5-325 MG PO TABS
ORAL_TABLET | ORAL | 0 refills | Status: DC
Start: 2022-02-09 — End: 2022-05-19

## 2022-02-09 MED ORDER — TERCONAZOLE 0.4 % VA CREA: 1.0000 | TOPICAL_CREAM | Freq: Every day | VAGINAL | 4 refills | Status: DC

## 2022-02-09 MED ORDER — CLONIDINE HCL 0.1 MG PO TABS: 0.1000 mg | ORAL_TABLET | Freq: Two times a day (BID) | ORAL | 1 refills | Status: DC

## 2022-02-09 NOTE — Progress Notes (Signed)
? ?Subjective:  ? ? Patient ID: Lisa Li, female    DOB: [birth year], 56 y.o.   MRN: [id number] ? ?Diabetes ?She presents for her follow-up diabetic visit. She has type 2 diabetes mellitus. Current diabetic treatments: metformin , glipizide.  ? ?Discuss possible side effects from radiation - cough and congestion patient with significant head congestion some chest congestion coughing up discolored phlegm ? ?Patient also has a history of diverticulitis and she has been having some left lower quadrant abdominal pain she denies fever chills vomiting.  But the pain very persistent ? ?Patient also has intermittent epigastric fullness daily swelling and nausea with eating has had years of diabetes could well be diabetic gastroparesis ? ?Patient also with significant liver enzyme elevations which can drastically increase her risk of developing cirrhosis will need to have an ultrasound ? ?This patient has adult ADD. Takes medication responsibly. Medication does help the patient focus in be more functional. Patient relates that they are or not abusing the medication or misusing the medication. The patient understands that if they're having any negative side effects such as elevated high blood pressure severe headaches they would need stop the medication follow-up immediately. They also understand that the prescriptions are to last for 3 months then the patient will need to follow-up before having further prescriptions. ? ?Patient compliance relates good compliance ? ?Does medication help patient function /attention better states it does help her with focus ? ?Side effects denies side effects ? ?This patient was seen today for chronic pain ? ?The medication list was reviewed and updated. ? ?Location of Pain for which the patient has been treated with regarding narcotics: Has chronic pain in her neck and back and in her knees ? ?Onset of this pain: Present for years ? ? -Compliance with medication: Good compliance ? ?-  Number patient states they take daily: 3-4 daily ? ?-when was the last dose patient took?  Earlier today ? ?The patient was advised the importance of maintaining medication and not using illegal substances with these. ? ?Here for refills and follow up ? ?The patient was educated that we can provide 3 monthly scripts for their medication, it is their responsibility to follow the instructions. ? ?Side effects or complications from medications: Denies side effects ? ?Patient is aware that pain medications are meant to minimize the severity of the pain to allow their pain levels to improve to allow for better function. They are aware of that pain medications cannot totally remove their pain. ? ?Due for UDT ( at least once per year) : Due on next visit ? ?Scale of 1 to 10 ( 1 is least 10 is most) ?Your pain level without the medicine: 8 ?Your pain level with medication 3 ? ?Scale 1 to 10 ( 1-helps very little, 10 helps very well) ?How well does your pain medication reduce your pain so you can function better through out the day? 8 ? ?Quality of the pain: Throbbing aching ? ?Persistence of the pain: Present all the time ? ?Modifying factors: Worse with overactivity ? ? ?  ? ? ?Review of Systems ? ?   ?Objective:  ? Physical Exam ?General-in no acute distress ?Eyes-no discharge ?Lungs-respiratory rate normal, CTA ?CV-no murmurs,RRR ?Extremities skin warm dry no edema ?Neuro grossly normal ?Behavior normal, alert ? ? ? ? ?   ?Assessment & Plan:  ?1. Essential hypertension ?Blood pressure been under decent control but not quite where it needs to be here, add clonidine 0.1 mg  twice daily this will also help her with her hot flashes ? ?2. Controlled type 2 diabetes mellitus without complication, without long-term current use of insulin ([place]) ?Check A1c on upcoming lab work.  Healthy diet recommended ? ?3. Hyperlipidemia associated with type 2 diabetes mellitus ([place]) ?Continue Repatha.  Check lipid profile.  Patient does not  tolerate statins ? ?4. Attention deficit disorder, unspecified hyperactivity presence ?Refills of ADD meds given patient states it helps her focus at work she benefits from it ? ?5. Chronic pain syndrome ?The patient was seen in followup for chronic pain. ?A review over at their current pain status was discussed. Drug registry was checked. ?Prescriptions were given.  Regular follow-up recommended. ?Discussion was held regarding the importance of compliance with medication as well as pain medication contract. ? ?Patient was informed that medication may cause drowsiness and should not be combined  with other medications/alcohol or street drugs. If the patient feels medication is causing altered alertness then do not drive or operate dangerous equipment. ? ?Should be noted that the patient appears to be meeting appropriate use of opioids and response.  Evidenced by improved function and decent pain control without significant side effects and no evidence of overt aberrancy issues.  Upon discussion with the patient today they understand that opioid therapy is optional and they feel that the pain has been refractory to reasonable conservative measures and is significant and affecting quality of life enough to warrant ongoing therapy and wishes to continue opioids.  Refills were provided. ? ? ?6. Hot flashes ?Restart Prozac ?Also utilize clonidine 0.1 mg twice daily ?Hot flashes made worse by her treatment for breast cancer ? ?7. Abdominal bloating ?Possible gastroparesis recommend gastric emptying study ? ?Patient also with elevated liver enzymes recommend ultrasound to look for any signs of cirrhosis ? ?Healthy diet recommended ? ?Breast cancer under the care of specialist ? ?Augmentin 875 twice daily to treat for the possibility of lung infection as well as treat for diverticulitis if any ongoing troubles will need follow-up imaging notify us if not doing dramatically better within 2 weeks ? ?Follow-up in 3  months ? ? ?

## 2022-02-12 ENCOUNTER — Other Ambulatory Visit: Payer: Self-pay | Admitting: Family Medicine

## 2022-02-13 ENCOUNTER — Telehealth: Payer: Self-pay | Admitting: Family Medicine

## 2022-02-13 ENCOUNTER — Other Ambulatory Visit: Payer: Self-pay | Admitting: Family Medicine

## 2022-02-13 MED ORDER — FLUCONAZOLE 150 MG PO TABS: ORAL_TABLET | ORAL | 0 refills | Status: DC

## 2022-02-13 NOTE — Telephone Encounter (Signed)
Pt calling in to request Diflucan. Pt currently placed on Augmentin. Diflucan sent in per protocol to [place]  ?

## 2022-02-16 ENCOUNTER — Telehealth: Payer: Self-pay | Admitting: Family Medicine

## 2022-02-16 ENCOUNTER — Encounter: Payer: Self-pay | Admitting: Family Medicine

## 2022-02-16 NOTE — Telephone Encounter (Signed)
Patient is needing NM Gastric Emptying and US Abdomen Limited RUQ (Liver) GB released so [facility] can schedule scans it has not been released in system yet. ?

## 2022-02-16 NOTE — Telephone Encounter (Signed)
Per referral coordinator; I called [facility] and they are going to fax me an order that they use - that's what I am waiting on ?Pt also sent my chart message- pt would like referral coordinator to call her. Message sent to referral coordinator to call patient.  ?

## 2022-02-16 NOTE — Telephone Encounter (Signed)
Epic Orders are released and active but they cant see the order at [facility]- Patient aware ?

## 2022-02-16 NOTE — Telephone Encounter (Signed)
Sent referral coordinator a message ?

## 2022-02-16 NOTE — Telephone Encounter (Signed)
Message sent to referral coordinator to contact patient concerning order  ?

## 2022-02-17 ENCOUNTER — Telehealth: Payer: Self-pay | Admitting: Family Medicine

## 2022-02-17 NOTE — Telephone Encounter (Signed)
I signed it it is on the door thank you ?

## 2022-02-17 NOTE — Telephone Encounter (Signed)
Orders written out and placed on provider door. Please advise. Thank you ?

## 2022-02-17 NOTE — Telephone Encounter (Signed)
Orders have been faxed

## 2022-02-17 NOTE — Telephone Encounter (Signed)
If you write out the orders I will sign them thank you ?

## 2022-02-17 NOTE — Telephone Encounter (Signed)
Per Loma Sousa ?Hey on this Patient - I called back to [place] and I spoke with Thayer Headings - She stated a signed MD's order would work as well - She said the order on a script sheet would work just fine - So can you get Dr. Wolfgang Phoenix to fax those orders to [phone]? ? ?Pt has orders in for NM Gastric Emptying and [place] Abd Limited (Liver/Gb). Please advise. Thank you. ?

## 2022-02-24 ENCOUNTER — Encounter: Payer: Self-pay | Admitting: Family Medicine

## 2022-03-01 ENCOUNTER — Encounter: Payer: Self-pay | Admitting: Family Medicine

## 2022-03-17 ENCOUNTER — Other Ambulatory Visit: Payer: Self-pay | Admitting: Family Medicine

## 2022-04-13 ENCOUNTER — Other Ambulatory Visit: Payer: Self-pay | Admitting: Family Medicine

## 2022-05-12 ENCOUNTER — Ambulatory Visit: Payer: Self-pay | Admitting: Family Medicine

## 2022-05-19 ENCOUNTER — Ambulatory Visit (INDEPENDENT_AMBULATORY_CARE_PROVIDER_SITE_OTHER): Payer: Managed Care, Other (non HMO) | Admitting: Family Medicine

## 2022-05-19 VITALS — BP 122/68 | HR 86 | Temp 97.7°F | Ht 63.0 in | Wt 177.0 lb

## 2022-05-19 DIAGNOSIS — R109 Unspecified abdominal pain: Secondary | ICD-10-CM

## 2022-05-19 DIAGNOSIS — Z23 Encounter for immunization: Secondary | ICD-10-CM

## 2022-05-19 DIAGNOSIS — E1140 Type 2 diabetes mellitus with diabetic neuropathy, unspecified: Secondary | ICD-10-CM | POA: Diagnosis not present

## 2022-05-19 DIAGNOSIS — E119 Type 2 diabetes mellitus without complications: Secondary | ICD-10-CM

## 2022-05-19 DIAGNOSIS — I10 Essential (primary) hypertension: Secondary | ICD-10-CM

## 2022-05-19 DIAGNOSIS — H9190 Unspecified hearing loss, unspecified ear: Secondary | ICD-10-CM

## 2022-05-19 DIAGNOSIS — R7401 Elevation of levels of liver transaminase levels: Secondary | ICD-10-CM | POA: Diagnosis not present

## 2022-05-19 DIAGNOSIS — R400 Somnolence: Secondary | ICD-10-CM

## 2022-05-19 DIAGNOSIS — E781 Pure hyperglyceridemia: Secondary | ICD-10-CM

## 2022-05-19 DIAGNOSIS — E785 Hyperlipidemia, unspecified: Secondary | ICD-10-CM

## 2022-05-19 DIAGNOSIS — Z79891 Long term (current) use of opiate analgesic: Secondary | ICD-10-CM

## 2022-05-19 DIAGNOSIS — F988 Other specified behavioral and emotional disorders with onset usually occurring in childhood and adolescence: Secondary | ICD-10-CM

## 2022-05-19 DIAGNOSIS — E1169 Type 2 diabetes mellitus with other specified complication: Secondary | ICD-10-CM | POA: Diagnosis not present

## 2022-05-19 MED ORDER — HYDROCODONE-ACETAMINOPHEN 5-325 MG PO TABS: ORAL_TABLET | ORAL | 0 refills | Status: DC

## 2022-05-19 MED ORDER — AMPHETAMINE-DEXTROAMPHET ER 15 MG PO CP24: 15.0000 mg | ORAL_CAPSULE | ORAL | 0 refills | Status: DC

## 2022-05-19 MED ORDER — HYDROCODONE-ACETAMINOPHEN 5-325 MG PO TABS
ORAL_TABLET | ORAL | 0 refills | Status: DC
Start: 2022-05-19 — End: 2022-08-26

## 2022-05-19 MED ORDER — ONDANSETRON HCL 8 MG PO TABS
ORAL_TABLET | ORAL | 2 refills | Status: DC
Start: 2022-05-19 — End: 2023-08-09

## 2022-05-19 MED ORDER — GLIPIZIDE ER 5 MG PO TB24: 5.0000 mg | ORAL_TABLET | Freq: Every day | ORAL | 2 refills | Status: DC

## 2022-05-19 MED ORDER — GABAPENTIN 100 MG PO CAPS
ORAL_CAPSULE | ORAL | 5 refills | Status: DC
Start: 2022-05-19 — End: 2022-11-17

## 2022-05-19 MED ORDER — AMPHETAMINE-DEXTROAMPHET ER 15 MG PO CP24: 15.0000 mg | ORAL_CAPSULE | Freq: Every day | ORAL | 0 refills | Status: DC

## 2022-05-19 MED ORDER — AMOXICILLIN-POT CLAVULANATE 875-125 MG PO TABS: 1.0000 | ORAL_TABLET | Freq: Two times a day (BID) | ORAL | 0 refills | Status: DC

## 2022-05-19 NOTE — Progress Notes (Signed)
Subjective:    Patient ID: Lisa Li, female    DOB: 1966/03/23, 56 y.o.   MRN: [id number] 3 month follow up medication refills  Sinus infection green / yellow phlem, head aches, tooth pain Nausea with treatments radiation  HPI This patient has adult ADD. Takes medication responsibly. Medication does help the patient focus in be more functional. Patient relates that they are or not abusing the medication or misusing the medication. The patient understands that if they're having any negative side effects such as elevated high blood pressure severe headaches they would need stop the medication follow-up immediately. They also understand that the prescriptions are to last for 3 months then the patient will need to follow-up before having further prescriptions.  Patient compliance yes  Does medication help patient function /attention better  yes  Side effects  none This patient was seen today for chronic pain  The medication list was reviewed and updated.  Location of Pain for which the patient has been treated with regarding narcotics: Chronic lumbar pain  Onset of this pain: Present for years   -Compliance with medication: Good compliance  - Number patient states they take daily: For no more than 5  -when was the last dose patient took?  Earlier today  The patient was advised the importance of maintaining medication and not using illegal substances with these.  Here for refills and follow up  The patient was educated that we can provide 3 monthly scripts for their medication, it is their responsibility to follow the instructions.  Side effects or complications from medications: None  Patient is aware that pain medications are meant to minimize the severity of the pain to allow their pain levels to improve to allow for better function. They are aware of that pain medications cannot totally remove their pain.  Due for UDT ( at least once per year) : Next visit  Scale of 1  to 10 ( 1 is least 10 is most) Your pain level without the medicine: 8 Your pain level with medication 3  Scale 1 to 10 ( 1-helps very little, 10 helps very well) How well does your pain medication reduce your pain so you can function better through out the day? 7  Quality of the pain: Throbbing aching  Persistence of the pain: Present all the time  Modifying factors: Worse with activity  Patient also with diabetes tries to follow diet takes her medication A1c is coming down does not tolerate GLP-1's Patient also having some hearing difficulties worse in the left ear would like to see audiology Patient also has history of diverticulitis with localized abscess having ongoing abdominal pain and discomfort left lower quadrant.  Present almost every single day.  Denies high fever sweats chills.  Would benefit from CT scan Patient also with snoring at nighttime difficulty sleeping at times choking spells wonders if she may have sleep apnea also has daytime somnolence       Review of Systems     Objective:   Physical Exam  General-in no acute distress Eyes-no discharge Lungs-respiratory rate normal, CTA CV-no murmurs,RRR Extremities skin warm dry no edema Neuro grossly normal Behavior normal, alert       Assessment & Plan:  1. Hyperlipidemia associated with type 2 diabetes mellitus ([place]) Patient does not tolerate statins.  Is on Repatha doing very well cholesterol looks great - Hemoglobin A1c - Hepatic Function Panel - Basic metabolic panel - Lipid panel - Microalbumin/Creatinine Ratio, Urine  2. Painful  diabetic neuropathy [facility]) Patient does use her pain medicine.  Also in addition to this he utilizes gabapentin.  3. Elevated transaminase level Has history of fatty liver.  Liver enzymes stable repeat these again in several months - Hemoglobin A1c - Hepatic Function Panel - Basic metabolic panel - Lipid panel - Microalbumin/Creatinine Ratio, Urine  4. Daytime  somnolence Go ahead with sleep study.  Patient uses [facility] - Ambulatory referral to Sleep Studies  5. Left lateral abdominal pain Move forward with CT abdomen pelvis patient will get this at [facility] and notify us at when this is completed - CT Abdomen Pelvis W Contrast  6. Hearing loss, unspecified hearing loss type, unspecified laterality Will need to see audiology for evaluation and ENT as well - Ambulatory referral to ENT  7. Long-term current use of opiate analgesic The patient was seen in followup for chronic pain. A review over at their current pain status was discussed. Drug registry was checked. Prescriptions were given.  Regular follow-up recommended. Discussion was held regarding the importance of compliance with medication as well as pain medication contract.  Patient was informed that medication may cause drowsiness and should not be combined  with other medications/alcohol or street drugs. If the patient feels medication is causing altered alertness then do not drive or operate dangerous equipment.  Should be noted that the patient appears to be meeting appropriate use of opioids and response.  Evidenced by improved function and decent pain control without significant side effects and no evidence of overt aberrancy issues.  Upon discussion with the patient today they understand that opioid therapy is optional and they feel that the pain has been refractory to reasonable conservative measures and is significant and affecting quality of life enough to warrant ongoing therapy and wishes to continue opioids.  Refills were provided. Drug registry checked 3 prescription sent in  8. Attention deficit disorder, unspecified hyperactivity presence The patient was seen today as part of the visit regarding ADD.  Patient is stable on current regimen.  Appropriate prescriptions prescribed.  Medications were reviewed with the patient as well as compliance. Side effects were checked for.  Discussion regarding effectiveness was held. Prescriptions were electronically sent in.  Patient reminded to follow-up in approximately 3 months.   Plans to [facility] law with drug registry was checked and verified while present with the patient. Doing well with the medicine that does help her drug registry checked blood pressure doing well  9. Essential hypertension Blood pressure doing well continue current measures - Hemoglobin A1c - Hepatic Function Panel - Basic metabolic panel - Lipid panel - Microalbumin/Creatinine Ratio, Urine  10. Controlled type 2 diabetes mellitus without complication, without long-term current use of insulin (HCC) A1c as per above - Hemoglobin A1c - Hepatic Function Panel - Basic metabolic panel - Lipid panel - Microalbumin/Creatinine Ratio, Urine  11. Hypertriglyceridemia This is due to her uncontrolled diabetes healthier diet bump up the glipizide to 5 mg notify us if any low sugars continue the metformin - Hemoglobin A1c - Hepatic Function Panel - Basic metabolic panel - Lipid panel - Microalbumin/Creatinine Ratio, Urine  12. Immunization due Tdap today - Tdap vaccine greater than or equal to 7yo IM  Follow-up 3 months

## 2022-05-26 ENCOUNTER — Telehealth: Payer: Self-pay | Admitting: *Deleted

## 2022-05-26 ENCOUNTER — Other Ambulatory Visit: Payer: Self-pay | Admitting: Family Medicine

## 2022-05-26 MED ORDER — FLUCONAZOLE 150 MG PO TABS: ORAL_TABLET | ORAL | 0 refills | Status: DC

## 2022-05-26 NOTE — Telephone Encounter (Signed)
Prescription sent electronically to pharmacy. Left message to return call to notify patient. 

## 2022-05-26 NOTE — Telephone Encounter (Signed)
Patient called and stated she is currently on antibiotic and has yeast infection and would like diflucan called in. Patient states she would also like more diflucan to take after she actually finishes the antibiotic

## 2022-05-26 NOTE — Telephone Encounter (Signed)
Diflucan 150 mg, #2, take 1 now and repeat in 1 week

## 2022-05-27 NOTE — Telephone Encounter (Signed)
Patient notified

## 2022-06-08 LAB — HM DIABETES EYE EXAM

## 2022-06-11 ENCOUNTER — Ambulatory Visit (HOSPITAL_COMMUNITY): Admission: RE | Admit: 2022-06-11 | Payer: Managed Care, Other (non HMO) | Source: Ambulatory Visit

## 2022-06-12 ENCOUNTER — Encounter: Payer: Self-pay | Admitting: Family Medicine

## 2022-06-17 ENCOUNTER — Other Ambulatory Visit: Payer: Self-pay | Admitting: Family Medicine

## 2022-06-18 ENCOUNTER — Other Ambulatory Visit: Payer: Self-pay | Admitting: Family Medicine

## 2022-06-19 ENCOUNTER — Encounter: Payer: Self-pay | Admitting: Family Medicine

## 2022-07-16 ENCOUNTER — Encounter: Payer: Self-pay | Admitting: Nurse Practitioner

## 2022-07-16 ENCOUNTER — Ambulatory Visit (INDEPENDENT_AMBULATORY_CARE_PROVIDER_SITE_OTHER): Payer: Managed Care, Other (non HMO) | Admitting: Nurse Practitioner

## 2022-07-16 VITALS — BP 150/83 | HR 83 | Temp 97.9°F | Ht 63.0 in | Wt 171.0 lb

## 2022-07-16 DIAGNOSIS — M5416 Radiculopathy, lumbar region: Secondary | ICD-10-CM | POA: Diagnosis not present

## 2022-07-16 MED ORDER — METHOCARBAMOL 500 MG PO TABS: 500.0000 mg | ORAL_TABLET | Freq: Four times a day (QID) | ORAL | 0 refills | Status: DC

## 2022-07-16 NOTE — Progress Notes (Signed)
   Subjective:    Patient ID: Lisa Li, female    DOB: January 12, 1966, 56 y.o.   MRN: [id number]  HPI  Low and mid back pain shooting down to inner thighs and legs and feet x3 weeks.  Patient states she has history of impinged nerve in her lower back and thinks that she tweaked it while at work 3 weeks ago.  Patient states pain is worse on the left side her back and her left leg.  Patient states she has been using Mobic and ice with little relief.  Patient has been using a little bit of a reboxetine which states helps but also makes her sleepy so she does not take it as much.  Patient states pain is exacerbated by hurting and standing.  Pain is better when lying down.  Patient reports mild weakness to her left leg.  Patient denies any gross weakness, incontinence, chest pain, shortness of breath.   Review of Systems  Musculoskeletal:  Positive for back pain.       Objective:   Physical Exam Vitals reviewed.  Constitutional:      General: She is not in acute distress.    Appearance: Normal appearance. She is normal weight. She is not ill-appearing, toxic-appearing or diaphoretic.  HENT:     Head: Normocephalic and atraumatic.  Cardiovascular:     Rate and Rhythm: Normal rate and regular rhythm.     Pulses: Normal pulses.     Heart sounds: Normal heart sounds. No murmur heard. Pulmonary:     Effort: Pulmonary effort is normal. No respiratory distress.     Breath sounds: Normal breath sounds. No wheezing.  Musculoskeletal:     Comments: 5 out of 5 strength noted to bilateral legs.  Positive straight leg test to left leg.  Pedal pulse intact.  Point tenderness bilateral lumbar spine.  Skin:    General: Skin is warm.     Capillary Refill: Capillary refill takes less than 2 seconds.  Neurological:     Mental Status: She is alert.     Comments: Grossly intact  Psychiatric:        Mood and Affect: Mood normal.        Behavior: Behavior normal.           Assessment &  Plan:   1. Lumbar nerve root impingement -Due to patient's history of impinged nerve and previous MRI, believe repeat MRI and referral to neurosurgery warranted to ensure no further impingement - MR Lumbar Spine Wo Contrast, stat - Ambulatory referral to Neurosurgery, stat - methocarbamol (ROBAXIN) 500 MG tablet; Take 1 tablet (500 mg total) by mouth 4 (four) times daily.  Dispense: 30 tablet; Refill: 0 -Follow-up with Dr. Nicki Reaper after appointment with neurosurgery -Return to clinic if symptoms do not improve or worsen    Note:  This document was prepared using Dragon voice recognition software and may include unintentional dictation errors. Note - This record has been created using [employer].  Chart creation errors have been sought, but may not always  have been located. Such creation errors do not reflect on  the standard of medical care.

## 2022-07-21 ENCOUNTER — Other Ambulatory Visit: Payer: Self-pay | Admitting: Family Medicine

## 2022-07-22 ENCOUNTER — Telehealth: Payer: Self-pay | Admitting: Family Medicine

## 2022-07-22 NOTE — Telephone Encounter (Signed)
Nurses I received her MRI the which was done through [facility] Should be noted shows multiple levels of degenerative disc disease as well as some foraminal spinal stenosis. Specialist with spinal orthopedics or neurosurgical's might consider doing injections not sure if they would recommend surgery Please talk with patient if she is interested in referral please go ahead with referral otherwise she has a follow-up office visit in November we can discuss this further at her follow-up office visit

## 2022-07-23 NOTE — Telephone Encounter (Signed)
MyChart message sent to patient.

## 2022-07-27 NOTE — Telephone Encounter (Signed)
Left message to return call 

## 2022-08-06 NOTE — Telephone Encounter (Signed)
Pt returned call and left message on voicemail. She has seen neurosurgery in [place] and is scheduled for lumbar surgery 09/24/22.

## 2022-08-06 NOTE — Telephone Encounter (Signed)
Left message to return call. Will done message per protocol since unable to reach patient.

## 2022-08-18 ENCOUNTER — Other Ambulatory Visit: Payer: Self-pay | Admitting: Family Medicine

## 2022-08-18 NOTE — Telephone Encounter (Signed)
6 months on all

## 2022-08-26 ENCOUNTER — Ambulatory Visit (INDEPENDENT_AMBULATORY_CARE_PROVIDER_SITE_OTHER): Payer: Managed Care, Other (non HMO) | Admitting: Family Medicine

## 2022-08-26 ENCOUNTER — Encounter: Payer: Self-pay | Admitting: Family Medicine

## 2022-08-26 VITALS — BP 114/72 | HR 80 | Temp 97.7°F | Ht 63.0 in | Wt 172.0 lb

## 2022-08-26 DIAGNOSIS — F988 Other specified behavioral and emotional disorders with onset usually occurring in childhood and adolescence: Secondary | ICD-10-CM

## 2022-08-26 DIAGNOSIS — G894 Chronic pain syndrome: Secondary | ICD-10-CM | POA: Diagnosis not present

## 2022-08-26 DIAGNOSIS — I10 Essential (primary) hypertension: Secondary | ICD-10-CM

## 2022-08-26 DIAGNOSIS — J019 Acute sinusitis, unspecified: Secondary | ICD-10-CM

## 2022-08-26 DIAGNOSIS — R233 Spontaneous ecchymoses: Secondary | ICD-10-CM

## 2022-08-26 DIAGNOSIS — E1169 Type 2 diabetes mellitus with other specified complication: Secondary | ICD-10-CM

## 2022-08-26 DIAGNOSIS — E119 Type 2 diabetes mellitus without complications: Secondary | ICD-10-CM | POA: Diagnosis not present

## 2022-08-26 DIAGNOSIS — E785 Hyperlipidemia, unspecified: Secondary | ICD-10-CM

## 2022-08-26 DIAGNOSIS — Z79891 Long term (current) use of opiate analgesic: Secondary | ICD-10-CM

## 2022-08-26 MED ORDER — AMOXICILLIN-POT CLAVULANATE 875-125 MG PO TABS: 1.0000 | ORAL_TABLET | Freq: Two times a day (BID) | ORAL | 0 refills | Status: DC

## 2022-08-26 MED ORDER — HYDROCODONE-ACETAMINOPHEN 5-325 MG PO TABS: ORAL_TABLET | ORAL | 0 refills | Status: DC

## 2022-08-26 MED ORDER — AMPHETAMINE-DEXTROAMPHET ER 15 MG PO CP24: ORAL_CAPSULE | ORAL | 0 refills | Status: DC

## 2022-08-26 MED ORDER — AMLODIPINE BESYLATE 5 MG PO TABS: 5.0000 mg | ORAL_TABLET | Freq: Every day | ORAL | 3 refills | Status: DC

## 2022-08-26 NOTE — Progress Notes (Signed)
Subjective:    Patient ID: Lisa Li, female    DOB: 07-May-1966, 56 y.o.   MRN: [id number]  HPI  Follow up , labs not done yet  The patient was seen today as part of a comprehensive diabetic check up. Patient has diabetes Patient relates good compliance with taking the medication. We discussed their diet and exercise activities  We also discussed the importance of notifying us if any excessively high glucoses or low sugars.   Patient for blood pressure check up.  The patient does have hypertension.   Patient relates dietary measures try to minimize salt The importance of healthy diet and activity were discussed Patient relates compliance  Patient here for follow-up regarding cholesterol.    Patient relates taking medication on a regular basis Denies problems with medication Importance of dietary measures discussed Regular lab work regarding lipid and liver was checked and if needing additional labs was appropriately ordered  This patient was seen today for chronic pain  The medication list was reviewed and updated.  Location of Pain for which the patient has been treated with regarding narcotics: Patient has back pain and neck pain  Onset of this pain: Present for years   -Compliance with medication: Good compliance  - Number patient states they take daily: 3 or 4/day  -when was the last dose patient took?  This week  The patient was advised the importance of maintaining medication and not using illegal substances with these.  Here for refills and follow up  The patient was educated that we can provide 3 monthly scripts for their medication, it is their responsibility to follow the instructions.  Side effects or complications from medications: Denies side effects  Patient is aware that pain medications are meant to minimize the severity of the pain to allow their pain levels to improve to allow for better function. They are aware of that pain medications cannot  totally remove their pain.  Due for UDT ( at least once per year) : Due today  Scale of 1 to 10 ( 1 is least 10 is most) Your pain level without the medicine: 8 Your pain level with medication 3  Scale 1 to 10 ( 1-helps very little, 10 helps very well) How well does your pain medication reduce your pain so you can function better through out the day?  7  Quality of the pain: Throbbing aching  Persistence of the pain: Present all the time  Modifying factors: Worse with activity     This patient has adult ADD. Takes medication responsibly. Medication does help the patient focus in be more functional. Patient relates that they are or not abusing the medication or misusing the medication. The patient understands that if they're having any negative side effects such as elevated high blood pressure severe headaches they would need stop the medication follow-up immediately. They also understand that the prescriptions are to last for 3 months then the patient will need to follow-up before having further prescriptions.  Patient compliance good compliance  Does medication help patient function /attention better yes  Side effects denies side effects Recently she has been trying to take less often  Review of Systems     Objective:   Physical Exam  General-in no acute distress Eyes-no discharge Lungs-respiratory rate normal, CTA CV-no murmurs,RRR Extremities skin warm dry no edema Neuro grossly normal Behavior normal, alert       Assessment & Plan:  1. Essential hypertension Blood pressure good control continue current  measures - CBC with Differential - Lipid Panel - Hepatic Function Panel - Basic Metabolic Panel - Microalbumin/Creatinine Ratio, Urine  2. Controlled type 2 diabetes mellitus without complication, without long-term current use of insulin ([place]) Patient will do her lab work in the near future await the results - CBC with Differential - Lipid Panel - Hepatic  Function Panel - Basic Metabolic Panel - Microalbumin/Creatinine Ratio, Urine  3. Hyperlipidemia associated with type 2 diabetes mellitus ([place]) Continue Repatha check lab work await results - CBC with Differential - Lipid Panel - Hepatic Function Panel - Basic Metabolic Panel - Microalbumin/Creatinine Ratio, Urine  4. Chronic pain syndrome The patient was seen in followup for chronic pain. A review over at their current pain status was discussed. Drug registry was checked. Prescriptions were given.  Regular follow-up recommended. Discussion was held regarding the importance of compliance with medication as well as pain medication contract.  Patient was informed that medication may cause drowsiness and should not be combined  with other medications/alcohol or street drugs. If the patient feels medication is causing altered alertness then do not drive or operate dangerous equipment.  Should be noted that the patient appears to be meeting appropriate use of opioids and response.  Evidenced by improved function and decent pain control without significant side effects and no evidence of overt aberrancy issues.  Upon discussion with the patient today they understand that opioid therapy is optional and they feel that the pain has been refractory to reasonable conservative measures and is significant and affecting quality of life enough to warrant ongoing therapy and wishes to continue opioids.  Refills were provided. Drug registry checked 3 prescription sent in - CBC with Differential - Lipid Panel - Hepatic Function Panel - Basic Metabolic Panel - Microalbumin/Creatinine Ratio, Urine - ToxASSURE Select 13 (MW), Urine  5. Long-term current use of opiate analgesic Urine drug screen - ToxASSURE Select 13 (MW), Urine  6. Easy bruising Bruising left ankle check CBC  7. Acute rhinosinusitis Antibiotic prescribed  8. Attention deficit disorder, unspecified hyperactivity presence Patient  trying to minimize use of ADD meds she will get 1 prescription only today  Patient has back surgery coming up await lab work  FU 3 months

## 2022-08-30 LAB — TOXASSURE SELECT 13 (MW), URINE

## 2022-09-17 ENCOUNTER — Telehealth: Payer: Self-pay

## 2022-09-17 NOTE — Telephone Encounter (Signed)
I would recommend a round of doxycycline 100 mg 1 twice daily for 7 days take with a snack and a tall glass of water  Please also let the patient know that there are multiple respiratory viruses going around that many of them are causing congestion drainage and coughing that can last several weeks As she is well aware antibiotics can help with secondary bacterial infections but if the congestion drainage is due to a virus antibiotics will not help for that  Also if she starts having high fevers chest congestion getting worse she would need to be seen thank you

## 2022-09-17 NOTE — Telephone Encounter (Signed)
Caller name: Lisa Li  On [place]?: Yes  Call back number: [phone] (mobile)  Provider they see: Kathyrn Drown, MD  Reason for call:Pt called in she was seen last week was put on amoxicillin-clavulanate (AUGMENTIN) 875-125 MG tablet but she is not improving she is congested sore throat. She seen the surgeon cleared her yesterday for back surgery next Thursday and she is needing to get this cleared up.

## 2022-09-17 NOTE — Telephone Encounter (Signed)
Caller name: SATONYA LUX   On [place]?: Yes   Call back number: [phone] (mobile)   Provider they see: Kathyrn Drown, MD   Reason for call:Pt called in she was seen last week was put on amoxicillin-clavulanate (AUGMENTIN) 875-125 MG tablet but she is not improving she is congested sore throat. She seen the surgeon cleared her yesterday for back surgery next Thursday and she is needing to get this cleared up.

## 2022-09-17 NOTE — Telephone Encounter (Signed)
Nurses Patient had CT scan on 3 December.  Did not show any diverticulitis or tumor Patient had lab work LDL cholesterol 93 total 207 See her other phone message regarding antibiotics

## 2022-09-18 ENCOUNTER — Other Ambulatory Visit: Payer: Self-pay

## 2022-09-18 ENCOUNTER — Telehealth: Payer: Self-pay

## 2022-09-18 MED ORDER — BENZONATATE 100 MG PO CAPS: ORAL_CAPSULE | ORAL | 1 refills | Status: DC

## 2022-09-18 MED ORDER — FLUCONAZOLE 150 MG PO TABS: ORAL_TABLET | ORAL | 0 refills | Status: DC

## 2022-09-18 MED ORDER — DOXYCYCLINE HYCLATE 100 MG PO TABS: ORAL_TABLET | ORAL | 0 refills | Status: DC

## 2022-09-18 NOTE — Telephone Encounter (Signed)
Please advise. Thank you (Diflucan has been sent in)

## 2022-09-18 NOTE — Telephone Encounter (Signed)
Mychart message sent.

## 2022-09-18 NOTE — Telephone Encounter (Signed)
Nurses I am not able to prescribe hydrocodone cough medicine Recent guidelines are going against utilizing hydrocodone cough medicine for anybody who is already on the hydrocodone several times per day Tessalon pearls '100mg'$  1 taken 3-4 times per day as needed for cough is what I can offer, #30, 1 refill

## 2022-09-18 NOTE — Telephone Encounter (Signed)
Caller name: DIANIA CO  On [place]?: Yes  Call back number: [phone] (mobile)  Provider they see: Kathyrn Drown, MD  Reason for call:Pt is wanting something  called in for yeast infection just incase antibiotic gives her one also she wanted to see if Dr Nicki Reaper can call in cough meds and to let him know that she is aware she is not to take her  HYDROcodone-acetaminophen (NORCO/VICODIN) 5-325 MG tablet at the same time that she is not taking this since she is out of work for surgery she is just needing the meds for cough until she gets through this congestion.  [employer]

## 2022-09-18 NOTE — Telephone Encounter (Signed)
Antibiotic sent to [facility]. Pt is aware. Message sent via my chart also

## 2022-10-16 ENCOUNTER — Other Ambulatory Visit: Payer: Self-pay | Admitting: Family Medicine

## 2022-11-16 ENCOUNTER — Other Ambulatory Visit: Payer: Self-pay | Admitting: Family Medicine

## 2022-11-25 ENCOUNTER — Ambulatory Visit: Payer: Managed Care, Other (non HMO) | Admitting: Family Medicine

## 2022-11-25 ENCOUNTER — Encounter: Payer: Self-pay | Admitting: Family Medicine

## 2022-11-25 VITALS — BP 125/77 | Wt 175.6 lb

## 2022-11-25 DIAGNOSIS — G894 Chronic pain syndrome: Secondary | ICD-10-CM | POA: Diagnosis not present

## 2022-11-25 DIAGNOSIS — E1169 Type 2 diabetes mellitus with other specified complication: Secondary | ICD-10-CM | POA: Diagnosis not present

## 2022-11-25 DIAGNOSIS — E538 Deficiency of other specified B group vitamins: Secondary | ICD-10-CM

## 2022-11-25 DIAGNOSIS — E559 Vitamin D deficiency, unspecified: Secondary | ICD-10-CM

## 2022-11-25 DIAGNOSIS — J019 Acute sinusitis, unspecified: Secondary | ICD-10-CM

## 2022-11-25 DIAGNOSIS — E785 Hyperlipidemia, unspecified: Secondary | ICD-10-CM

## 2022-11-25 DIAGNOSIS — I10 Essential (primary) hypertension: Secondary | ICD-10-CM

## 2022-11-25 DIAGNOSIS — E119 Type 2 diabetes mellitus without complications: Secondary | ICD-10-CM

## 2022-11-25 DIAGNOSIS — C50912 Malignant neoplasm of unspecified site of left female breast: Secondary | ICD-10-CM

## 2022-11-25 DIAGNOSIS — D509 Iron deficiency anemia, unspecified: Secondary | ICD-10-CM

## 2022-11-25 DIAGNOSIS — F988 Other specified behavioral and emotional disorders with onset usually occurring in childhood and adolescence: Secondary | ICD-10-CM

## 2022-11-25 MED ORDER — HYDROCODONE-ACETAMINOPHEN 5-325 MG PO TABS: ORAL_TABLET | ORAL | 0 refills | Status: DC

## 2022-11-25 MED ORDER — AMPHETAMINE-DEXTROAMPHET ER 15 MG PO CP24: ORAL_CAPSULE | ORAL | 0 refills | Status: AC

## 2022-11-25 MED ORDER — AMOXICILLIN-POT CLAVULANATE 875-125 MG PO TABS: 1.0000 | ORAL_TABLET | Freq: Two times a day (BID) | ORAL | 0 refills | Status: DC

## 2022-11-25 MED ORDER — FLUCONAZOLE 150 MG PO TABS: ORAL_TABLET | ORAL | 4 refills | Status: DC

## 2022-11-25 NOTE — Progress Notes (Signed)
Subjective:    Patient ID: Lisa Li, female    DOB: 10-26-1965, 57 y.o.   MRN: [id number]  HPI This patient was seen today for chronic pain  The medication list was reviewed and updated.  Location of Pain for which the patient has been treated with regarding narcotics:   Onset of this pain:    -Compliance with medication: Hydrocodone 5-325 mg  - Number patient states they take daily: 4-5 depends on day  -when was the last dose patient took? This morning   The patient was advised the importance of maintaining medication and not using illegal substances with these.  Here for refills and follow up  The patient was educated that we can provide 3 monthly scripts for their medication, it is their responsibility to follow the instructions.  Side effects or complications from medications: none  Patient is aware that pain medications are meant to minimize the severity of the pain to allow their pain levels to improve to allow for better function. They are aware of that pain medications cannot totally remove their pain.  Due for UDT ( at least once per year) : completed 08/2022  Scale of 1 to 10 ( 1 is least 10 is most) Your pain level without the medicine: 5 Your pain level with medication: 2  Scale 1 to 10 ( 1-helps very little, 10 helps very well) How well does your pain medication reduce your pain so you can function better through out the day? 10  Quality of the pain: Throbbing aching not quite as bad as what it was  Persistence of the pain: Doing improved since surgery  Modifying factors: Denies setbacks  Chronic sinus infection pt would like to discuss. Pt states she has been sick since before back surgery. Pt has been on multiple antibiotics and pt still has sinus infection. Pt states only time she feels better is when she is on antibiotic. Has been sleeping a lot which is not normal  Pt would like vitamin levels/amino acid levels checked. (Pt has not had  previous lab work completed at this time)    Controlled type 2 diabetes mellitus without complication, without long-term current use of insulin ([place]) - Plan: Hemoglobin A1c  Infiltrating ductal carcinoma of left breast ([place]), Chronic  Hyperlipidemia associated with type 2 diabetes mellitus ([place]) - Plan: CBC with Differential, CMP14+EGFR  Chronic pain syndrome  Acute rhinosinusitis  Attention deficit disorder, unspecified hyperactivity presence  Vitamin D deficiency - Plan: VITAMIN D 25 Hydroxy (Vit-D Deficiency, Fractures)  Vitamin B 12 deficiency - Plan: Vitamin B12  Iron deficiency anemia, unspecified iron deficiency anemia type - Plan: Magnesium, Ferritin, Iron, TIBC and Ferritin Panel Patient's recent lab work shows some mild anemia needs further workup He denies any bleeding issues Patient also has ADD for which she does take the medicine on a sparing basis to help her with worked in the handle all the details at times she does find the medicine helps her when she uses it She also has diabetes she is taking her medicine she checks her sugars occasionally her A1c recently was reasonable but will need to do follow-up lab work in the spring Also has hyperlipidemia and takes Repatha because she cannot tolerate statins Also has chronic sinus infection over the past several weeks ever since Thanksgiving with sinus pressure drainage discomfort and coughing   Review of Systems     Objective:   Physical Exam General-in no acute distress Eyes-no discharge Lungs-respiratory rate  normal, CTA CV-no murmurs,RRR Extremities skin warm dry no edema Neuro grossly normal Behavior normal, alert  Mild tenderness      Assessment & Plan:  1. Infiltrating ductal carcinoma of left breast [facility]) She is followed by specialist for this continue with monitoring and treatments through oncology  2. Controlled type 2 diabetes mellitus without complication, without long-term current use of insulin  (H[facility]) Previous A1c did not look reasonably well check this again in springtime The patient was seen today as part of a comprehensive visit for diabetes. The importance of keeping her A1c at or below 7 range was discussed.  Discussed diet, activity, and medication compliance Emphasized healthy eating primarily with vegetables fruits and if utilizing meats lean meats such as chicken or fish grilled baked broiled Avoid sugary drinks Minimize and avoid processed foods Fit in regular physical activity preferably 25 to 30 minutes 4 times per week Standard follow-up visit recommended.  Patient aware lack of control and follow-up increases risk of diabetic complications. Regular follow-up visits Yearly ophthalmology Yearly foot exam  - Hemoglobin A1c  3. Hyperlipidemia associated with type 2 diabetes mellitus (HCC) Hyperlipidemia-importance of diet, weight control, activity, compliance with medications discussed.   Recent labs reviewed.   Any additional labs or refills ordered.   Importance of keeping under good control discussed. Regular follow-up visits discussed Continue Repatha - CBC with Differential - CMP14+EGFR  4. Chronic pain syndrome The patient was seen in followup for chronic pain. A review over at their current pain status was discussed. Drug registry was checked. Prescriptions were given.  Regular follow-up recommended. Discussion was held regarding the importance of compliance with medication as well as pain medication contract.  Patient was informed that medication may cause drowsiness and should not be combined  with other medications/alcohol or street drugs. If the patient feels medication is causing altered alertness then do not drive or operate dangerous equipment.  Should be noted that the patient appears to be meeting appropriate use of opioids and response.  Evidenced by improved function and decent pain control without significant side effects and no evidence of overt  aberrancy issues.  Upon discussion with the patient today they understand that opioid therapy is optional and they feel that the pain has been refractory to reasonable conservative measures and is significant and affecting quality of life enough to warrant ongoing therapy and wishes to continue opioids.  Refills were provided.  She does benefit from the pain medicine 3 additional prescription was sent in for  5. Acute rhinosinusitis Antibiotics for 2 weeks If this does not get adequate resolution then neck step would be is referral for ENT  6. Attention deficit disorder, unspecified hyperactivity presence 1 refill ADD medicine sent and she will let us know if she needs additional  7. Vitamin D deficiency Check vitamin D level may need supplementation - VITAMIN D 25 Hydroxy (Vit-D Deficiency, Fractures)  8. Vitamin B 12 deficiency Check vitamin B12 level because of anemia - Vitamin B12  9. Iron deficiency anemia, unspecified iron deficiency anemia type Check iron panel because of anemia - Magnesium - Ferritin - Iron, TIBC and Ferritin Panel

## 2022-12-15 ENCOUNTER — Other Ambulatory Visit: Payer: Self-pay | Admitting: Family Medicine

## 2023-01-13 ENCOUNTER — Other Ambulatory Visit: Payer: Self-pay | Admitting: Family Medicine

## 2023-01-15 ENCOUNTER — Other Ambulatory Visit: Payer: Self-pay | Admitting: *Deleted

## 2023-01-15 MED ORDER — VALSARTAN 160 MG PO TABS: 160.0000 mg | ORAL_TABLET | Freq: Every day | ORAL | 0 refills | Status: DC

## 2023-02-16 ENCOUNTER — Other Ambulatory Visit: Payer: Self-pay | Admitting: Family Medicine

## 2023-02-23 LAB — VITAMIN B12: Vitamin B-12: 588 pg/mL (ref 232–1245)

## 2023-02-23 LAB — CBC WITH DIFFERENTIAL/PLATELET
Basophils Absolute: 0 10*3/uL (ref 0.0–0.2)
Basos: 1 %
EOS (ABSOLUTE): 0.2 10*3/uL (ref 0.0–0.4)
Eos: 3 %
Hematocrit: 37.6 % (ref 34.0–46.6)
Hemoglobin: 12.2 g/dL (ref 11.1–15.9)
Immature Grans (Abs): 0 10*3/uL (ref 0.0–0.1)
Immature Granulocytes: 0 %
Lymphocytes Absolute: 2.1 10*3/uL (ref 0.7–3.1)
Lymphs: 35 %
MCH: 26.2 pg — ABNORMAL LOW (ref 26.6–33.0)
MCHC: 32.4 g/dL (ref 31.5–35.7)
MCV: 81 fL (ref 79–97)
Monocytes Absolute: 0.4 10*3/uL (ref 0.1–0.9)
Monocytes: 6 %
Neutrophils Absolute: 3.3 10*3/uL (ref 1.4–7.0)
Neutrophils: 55 %
Platelets: 210 10*3/uL (ref 150–450)
RBC: 4.66 x10E6/uL (ref 3.77–5.28)
RDW: 13.5 % (ref 11.7–15.4)
WBC: 6 10*3/uL (ref 3.4–10.8)

## 2023-02-23 LAB — CMP14+EGFR
ALT: 73 IU/L — ABNORMAL HIGH (ref 0–32)
AST: 75 IU/L — ABNORMAL HIGH (ref 0–40)
Albumin/Globulin Ratio: 1.7 (ref 1.2–2.2)
Albumin: 5 g/dL — ABNORMAL HIGH (ref 3.8–4.9)
Alkaline Phosphatase: 57 IU/L (ref 44–121)
BUN/Creatinine Ratio: 17 (ref 9–23)
BUN: 17 mg/dL (ref 6–24)
Bilirubin Total: 0.4 mg/dL (ref 0.0–1.2)
CO2: 23 mmol/L (ref 20–29)
Calcium: 10.2 mg/dL (ref 8.7–10.2)
Chloride: 95 mmol/L — ABNORMAL LOW (ref 96–106)
Creatinine, Ser: 0.99 mg/dL (ref 0.57–1.00)
Globulin, Total: 2.9 g/dL (ref 1.5–4.5)
Glucose: 179 mg/dL — ABNORMAL HIGH (ref 70–99)
Potassium: 4.6 mmol/L (ref 3.5–5.2)
Sodium: 136 mmol/L (ref 134–144)
Total Protein: 7.9 g/dL (ref 6.0–8.5)
eGFR: 67 mL/min/{1.73_m2} (ref >59)

## 2023-02-23 LAB — HEPATIC FUNCTION PANEL: Bilirubin, Direct: 0.12 mg/dL (ref 0.00–0.40)

## 2023-02-23 LAB — HEMOGLOBIN A1C
Est. average glucose Bld gHb Est-mCnc: 163 mg/dL
Hgb A1c MFr Bld: 7.3 % — ABNORMAL HIGH (ref 4.8–5.6)

## 2023-02-23 LAB — IRON,TIBC AND FERRITIN PANEL
Ferritin: 187 ng/mL — ABNORMAL HIGH (ref 15–150)
Iron Saturation: 22 % (ref 15–55)
Iron: 81 ug/dL (ref 27–159)
Total Iron Binding Capacity: 365 ug/dL (ref 250–450)
UIBC: 284 ug/dL (ref 131–425)

## 2023-02-23 LAB — VITAMIN D 25 HYDROXY (VIT D DEFICIENCY, FRACTURES): Vit D, 25-Hydroxy: 25 ng/mL — ABNORMAL LOW (ref 30.0–100.0)

## 2023-02-23 LAB — MAGNESIUM: Magnesium: 1.7 mg/dL (ref 1.6–2.3)

## 2023-02-24 ENCOUNTER — Ambulatory Visit: Payer: Managed Care, Other (non HMO) | Admitting: Family Medicine

## 2023-02-24 ENCOUNTER — Ambulatory Visit (INDEPENDENT_AMBULATORY_CARE_PROVIDER_SITE_OTHER): Payer: Managed Care, Other (non HMO) | Admitting: Family Medicine

## 2023-02-24 VITALS — BP 122/84 | Ht 63.0 in | Wt 170.4 lb

## 2023-02-24 DIAGNOSIS — R748 Abnormal levels of other serum enzymes: Secondary | ICD-10-CM

## 2023-02-24 DIAGNOSIS — J019 Acute sinusitis, unspecified: Secondary | ICD-10-CM

## 2023-02-24 DIAGNOSIS — Z79891 Long term (current) use of opiate analgesic: Secondary | ICD-10-CM

## 2023-02-24 DIAGNOSIS — R5383 Other fatigue: Secondary | ICD-10-CM

## 2023-02-24 DIAGNOSIS — E559 Vitamin D deficiency, unspecified: Secondary | ICD-10-CM | POA: Diagnosis not present

## 2023-02-24 DIAGNOSIS — M2550 Pain in unspecified joint: Secondary | ICD-10-CM

## 2023-02-24 MED ORDER — VITAMIN D (ERGOCALCIFEROL) 1.25 MG (50000 UNIT) PO CAPS: ORAL_CAPSULE | ORAL | 4 refills | Status: DC

## 2023-02-24 MED ORDER — HYDROCODONE-ACETAMINOPHEN 5-325 MG PO TABS: ORAL_TABLET | ORAL | 0 refills | Status: DC

## 2023-02-24 MED ORDER — AMOXICILLIN-POT CLAVULANATE 875-125 MG PO TABS: 1.0000 | ORAL_TABLET | Freq: Two times a day (BID) | ORAL | 0 refills | Status: DC

## 2023-02-24 MED ORDER — GLIPIZIDE ER 5 MG PO TB24: 5.0000 mg | ORAL_TABLET | Freq: Every day | ORAL | 2 refills | Status: DC

## 2023-02-24 MED ORDER — VALSARTAN 160 MG PO TABS: 160.0000 mg | ORAL_TABLET | Freq: Every day | ORAL | 3 refills | Status: DC

## 2023-02-24 MED ORDER — METFORMIN HCL ER 750 MG PO TB24: ORAL_TABLET | ORAL | 3 refills | Status: DC

## 2023-02-24 MED ORDER — OMEPRAZOLE 40 MG PO CPDR: 40.0000 mg | DELAYED_RELEASE_CAPSULE | Freq: Every day | ORAL | 3 refills | Status: DC

## 2023-02-24 MED ORDER — MELOXICAM 15 MG PO TABS: ORAL_TABLET | ORAL | 3 refills | Status: DC

## 2023-02-24 NOTE — Progress Notes (Unsigned)
Subjective:    Patient ID: Lisa Li, female    DOB: [birth year], 57 y.o.   MRN: [id number]  Diabetes She presents for her follow-up diabetic visit. She has type 2 diabetes mellitus. Risk factors for coronary artery disease include diabetes mellitus. Current diabetic treatment includes oral agent (dual therapy). She is compliant with treatment all of the time.   Needs refill on Meloxicam Results for orders placed or performed in visit on 11/25/22  VITAMIN D 25 Hydroxy (Vit-D Deficiency, Fractures)  Result Value Ref Range   Vit D, 25-Hydroxy 25.0 (L) 30.0 - 100.0 ng/mL  Magnesium  Result Value Ref Range   Magnesium 1.7 1.6 - 2.3 mg/dL  Vitamin Q65  Result Value Ref Range   Vitamin B-12 588 232 - 1,245 pg/mL  CBC with Differential  Result Value Ref Range   WBC 6.0 3.4 - 10.8 x10E3/uL   RBC 4.66 3.77 - 5.28 x10E6/uL   Hemoglobin 12.2 11.1 - 15.9 g/dL   Hematocrit 78.4 69.6 - 46.6 %   MCV 81 79 - 97 fL   MCH 26.2 (L) 26.6 - 33.0 pg   MCHC 32.4 31.5 - 35.7 g/dL   RDW 29.5 28.4 - 13.2 %   Platelets 210 150 - 450 x10E3/uL   Neutrophils 55 Not Estab. %   Lymphs 35 Not Estab. %   Monocytes 6 Not Estab. %   Eos 3 Not Estab. %   Basos 1 Not Estab. %   Neutrophils Absolute 3.3 1.4 - 7.0 x10E3/uL   Lymphocytes Absolute 2.1 0.7 - 3.1 x10E3/uL   Monocytes Absolute 0.4 0.1 - 0.9 x10E3/uL   EOS (ABSOLUTE) 0.2 0.0 - 0.4 x10E3/uL   Basophils Absolute 0.0 0.0 - 0.2 x10E3/uL   Immature Granulocytes 0 Not Estab. %   Immature Grans (Abs) 0.0 0.0 - 0.1 x10E3/uL  CMP14+EGFR  Result Value Ref Range   Glucose 179 (H) 70 - 99 mg/dL   BUN 17 6 - 24 mg/dL   Creatinine, Ser 4.40 0.57 - 1.00 mg/dL   eGFR 67 >10 UV/OZD/6.64   BUN/Creatinine Ratio 17 9 - 23   Sodium 136 134 - 144 mmol/L   Potassium 4.6 3.5 - 5.2 mmol/L   Chloride 95 (L) 96 - 106 mmol/L   CO2 23 20 - 29 mmol/L   Calcium 10.2 8.7 - 10.2 mg/dL   Total Protein 7.9 6.0 - 8.5 g/dL   Albumin 5.0 (H) 3.8 - 4.9 g/dL   Globulin,  Total 2.9 1.5 - 4.5 g/dL   Albumin/Globulin Ratio 1.7 1.2 - 2.2   Bilirubin Total 0.4 0.0 - 1.2 mg/dL   Alkaline Phosphatase 57 44 - 121 IU/L   AST 75 (H) 0 - 40 IU/L   ALT 73 (H) 0 - 32 IU/L  Iron, TIBC and Ferritin Panel  Result Value Ref Range   Total Iron Binding Capacity 365 250 - 450 ug/dL   UIBC 403 474 - 259 ug/dL   Iron 81 27 - 563 ug/dL   Iron Saturation 22 15 - 55 %   Ferritin 187 (H) 15 - 150 ng/mL  Hemoglobin A1c  Result Value Ref Range   Hgb A1c MFr Bld 7.3 (H) 4.8 - 5.6 %   Est. average glucose Bld gHb Est-mCnc 163 mg/dL  Hepatic function panel  Result Value Ref Range   Bilirubin, Direct 0.12 0.00 - 0.40 mg/dL   Acute rhinosinusitis  Other fatigue  Vitamin D deficiency  Long-term current use of opiate analgesic  Arthralgia, unspecified  joint  Elevated liver enzymes  Patient has had breast cancer she is on for Mayra She is having some troubles with fatigue tiredness also elevated liver enzymes and a lot of joint pains and discomforts she is uncertain what is triggering all of that She also has history of vitamin D deficiency tiredness feeling rundown. This patient was seen today for chronic pain  The medication list was reviewed and updated.  Outpatient Encounter Medications as of 02/24/2023  Medication Sig Note   Vitamin D, Ergocalciferol, (DRISDOL) 1.25 MG (50000 UNIT) CAPS capsule 1 q week    amLODipine (NORVASC) 5 MG tablet Take 1 tablet (5 mg total) by mouth daily.    amoxicillin-clavulanate (AUGMENTIN) 875-125 MG tablet Take 1 tablet by mouth 2 (two) times daily.    amphetamine-dextroamphetamine (ADDERALL XR) 15 MG 24 hr capsule TAKE 1 CAPSULE BY MOUTH DAILY    betamethasone dipropionate (DIPROLENE) 0.05 % cream Apply topically 2 (two) times daily.    chlorzoxazone (PARAFON FORTE DSC) 500 MG tablet Take 1 tablet (500 mg total) by mouth 3 (three) times daily as needed for muscle spasms.    diclofenac sodium (VOLTAREN) 1 % GEL Apply 4 g topically 4  (four) times daily.    docusate sodium (COLACE) 100 MG capsule Take 100 mg by mouth as needed.     FLUoxetine (PROZAC) 10 MG capsule TAKE 1 CAPSULE BY MOUTH DAILY.    gabapentin (NEURONTIN) 100 MG capsule TAKE 1 CAPSULE BY MOUTH EVERY (8) HOURS AS NEEDED.    glipiZIDE (GLUCOTROL XL) 5 MG 24 hr tablet Take 1 tablet (5 mg total) by mouth daily with breakfast.    HYDROcodone-acetaminophen (NORCO/VICODIN) 5-325 MG tablet 1 every 4 hours as needed pain maximum 5/day    HYDROcodone-acetaminophen (NORCO/VICODIN) 5-325 MG tablet Take one every 4 hours prn pain. Maximum 5 per day    HYDROcodone-acetaminophen (NORCO/VICODIN) 5-325 MG tablet TAKE 1 TABLET BY MOUTH EVERY 4 HOURS AS NEEDED FOR PAIN (MAX 5 PER DAY)    lidocaine (XYLOCAINE JELLY) 2 % jelly Apply 1 application topically as needed. Apply to leg    lidocaine (XYLOCAINE) 5 % ointment Apply 1 application topically as needed. Apply to toe    lidocaine-prilocaine (EMLA) cream APPLY TO AFFECTED AREAS2TWICE DAILY AS NEEDED.    loratadine (CLARITIN) 10 MG tablet Take 10 mg by mouth daily.    meloxicam (MOBIC) 15 MG tablet 1 qd    metFORMIN (GLUCOPHAGE-XR) 750 MG 24 hr tablet 1 bid    nystatin (MYCOSTATIN) 100000 UNIT/ML suspension Swish and spit 5 mls po QID    omeprazole (PRILOSEC) 40 MG capsule Take 1 capsule (40 mg total) by mouth daily.    ondansetron (ZOFRAN) 8 MG tablet Take one tablet po TID prn    REPATHA SURECLICK 140 MG/ML SOAJ Inject into the skin. 10/22/2021: Twice a month   terconazole (TERAZOL 7) 0.4 % vaginal cream Place 1 applicator vaginally at bedtime.    triamcinolone (NASACORT) 55 MCG/ACT AERO nasal inhaler USE 1 SPRAY IN EACH NOSTRIL DAILY.    triamcinolone cream (KENALOG) 0.1 % Apply to affected area twice daily as needed    valsartan (DIOVAN) 160 MG tablet Take 1 tablet (160 mg total) by mouth daily.    [DISCONTINUED] amoxicillin-clavulanate (AUGMENTIN) 875-125 MG tablet Take 1 tablet by mouth 2 (two) times daily.     [DISCONTINUED] fluconazole (DIFLUCAN) 150 MG tablet 1 now and 1 in 5 days    [DISCONTINUED] glipiZIDE (GLUCOTROL XL) 5 MG 24 hr tablet Take 1  tablet (5 mg total) by mouth daily with breakfast.    [DISCONTINUED] HYDROcodone-acetaminophen (NORCO/VICODIN) 5-325 MG tablet TAKE 1 TABLET BY MOUTH EVERY 4 HOURS AS NEEDED FOR PAIN (MAX 5 PER DAY)    [DISCONTINUED] HYDROcodone-acetaminophen (NORCO/VICODIN) 5-325 MG tablet 1 every 4 hours as needed pain maximum 5/day    [DISCONTINUED] HYDROcodone-acetaminophen (NORCO/VICODIN) 5-325 MG tablet Take one every 4 hours prn pain. Maximum 5 per day    [DISCONTINUED] lidocaine (XYLOCAINE JELLY) 2 % jelly Apply 1 application topically as needed.    [DISCONTINUED] meloxicam (MOBIC) 15 MG tablet TAKE 1 TABLET ONCE DAILY. DO NOT USE ON THE SAME DAY AS NAPROXEN.    [DISCONTINUED] metFORMIN (GLUCOPHAGE-XR) 750 MG 24 hr tablet TAKE 1 TABLET TWICE DAILY WITH BREAKFAST.    [DISCONTINUED] omeprazole (PRILOSEC) 40 MG capsule TAKE 1 CAPSULE BY MOUTH ONCE DAILY.    [DISCONTINUED] terconazole (TERAZOL 7) 0.4 % vaginal cream Place 1 applicator vaginally at bedtime.    [DISCONTINUED] valsartan (DIOVAN) 160 MG tablet Take 1 tablet (160 mg total) by mouth daily.    No facility-administered encounter medications on file as of 02/24/2023.    Location of Pain for which the patient has been treated with regarding narcotics: History of lumbar disc disease previous interventions  Onset of this pain: Present for years   -Compliance with medication: Good compliance  - Number patient states they take daily: Typically 4/day maximum 5/day  -Reason for ongoing use of opioids please see above  What other measures have been tried outside of opioids anti-inflammatories Tylenol stretching interventions  In the ongoing specialists regarding this condition has seen back specialist before not currently  -when was the last dose patient took?  Earlier today  The patient was advised the  importance of maintaining medication and not using illegal substances with these.  Here for refills and follow up  The patient was educated that we can provide 3 monthly scripts for their medication, it is their responsibility to follow the instructions.  Side effects or complications from medications: None  Patient is aware that pain medications are meant to minimize the severity of the pain to allow their pain levels to improve to allow for better function. They are aware of that pain medications cannot totally remove their pain.  Due for UDT ( at least once per year) (pain management contract is also completed at the time of the UDT): November 2023  Scale of 1 to 10 ( 1 is least 10 is most) Your pain level without the medicine: 8 Your pain level with medication 4  Scale 1 to 10 ( 1-helps very little, 10 helps very well) How well does your pain medication reduce your pain so you can function better through out the day? 7  Quality of the pain: Burning throbbing  Persistence of the pain: Present all the time  Modifying factors: Worse with activity  Patient also with 2 to 3 weeks of sinus pressure pain discomfort and soreness in the center of maxillary sinuses       Review of Systems     Objective:   Physical Exam General-in no acute distress Eyes-no discharge Lungs-respiratory rate normal, CTA CV-no murmurs,RRR Extremities skin warm dry no edema Neuro grossly normal Behavior normal, alert  Sinus tenderness no respiratory distress      Assessment & Plan:  1. Acute rhinosinusitis Antibiotics prescribed  2. Other fatigue She feels this is related to her sinus infection but if it is not possibly the medications she is on perhaps  cancer medicine she is on but is also possible could be related to other issues.  3. Vitamin D deficiency Vitamin D as prescribed  4. Long-term current use of opiate analgesic The patient was seen in followup for chronic pain. A review  over at their current pain status was discussed. Drug registry was checked. Prescriptions were given.  Regular follow-up recommended. Discussion was held regarding the importance of compliance with medication as well as pain medication contract.  Patient was informed that medication may cause drowsiness and should not be combined  with other medications/alcohol or street drugs. If the patient feels medication is causing altered alertness then do not drive or operate dangerous equipment.  Should be noted that the patient appears to be meeting appropriate use of opioids and response.  Evidenced by improved function and decent pain control without significant side effects and no evidence of overt aberrancy issues.  Upon discussion with the patient today they understand that opioid therapy is optional and they feel that the pain has been refractory to reasonable conservative measures and is significant and affecting quality of life enough to warrant ongoing therapy and wishes to continue opioids.  Refills were provided. Pain medicine does benefit her 3 scripts were given she is to follow-up again in 3 months  5. Arthralgia, unspecified joint May need further workup depending on the results of the how she does after sinus infection  6. Elevated liver enzymes Look into see if her cancer medicine may be triggering this she does have a history of fatty liver  Follow-up in 3 months

## 2023-02-26 ENCOUNTER — Encounter: Payer: Self-pay | Admitting: Family Medicine

## 2023-03-01 NOTE — Telephone Encounter (Signed)
May go ahead with Diflucan 150 mg 1 p.o. now and take another in 1 week, #2 with 1 refill  Also please order liver profile patient will repeat this in 3 months time We will do other blood work at follow-up Follow-up in approximately 5 to 6 months  May share a message with patient thank

## 2023-03-02 ENCOUNTER — Other Ambulatory Visit: Payer: Self-pay

## 2023-03-02 DIAGNOSIS — R748 Abnormal levels of other serum enzymes: Secondary | ICD-10-CM

## 2023-03-02 MED ORDER — FLUCONAZOLE 150 MG PO TABS: ORAL_TABLET | ORAL | 1 refills | Status: DC

## 2023-06-01 ENCOUNTER — Telehealth: Payer: Managed Care, Other (non HMO) | Admitting: Family Medicine

## 2023-06-01 DIAGNOSIS — I10 Essential (primary) hypertension: Secondary | ICD-10-CM

## 2023-06-01 DIAGNOSIS — Z7984 Long term (current) use of oral hypoglycemic drugs: Secondary | ICD-10-CM

## 2023-06-01 DIAGNOSIS — F988 Other specified behavioral and emotional disorders with onset usually occurring in childhood and adolescence: Secondary | ICD-10-CM

## 2023-06-01 DIAGNOSIS — Z79891 Long term (current) use of opiate analgesic: Secondary | ICD-10-CM

## 2023-06-01 DIAGNOSIS — E559 Vitamin D deficiency, unspecified: Secondary | ICD-10-CM

## 2023-06-01 DIAGNOSIS — E1169 Type 2 diabetes mellitus with other specified complication: Secondary | ICD-10-CM

## 2023-06-01 DIAGNOSIS — R5383 Other fatigue: Secondary | ICD-10-CM | POA: Diagnosis not present

## 2023-06-01 DIAGNOSIS — E785 Hyperlipidemia, unspecified: Secondary | ICD-10-CM

## 2023-06-01 DIAGNOSIS — R748 Abnormal levels of other serum enzymes: Secondary | ICD-10-CM

## 2023-06-01 DIAGNOSIS — E119 Type 2 diabetes mellitus without complications: Secondary | ICD-10-CM

## 2023-06-01 DIAGNOSIS — R053 Chronic cough: Secondary | ICD-10-CM

## 2023-06-01 MED ORDER — GABAPENTIN 100 MG PO CAPS: ORAL_CAPSULE | ORAL | 5 refills | Status: DC

## 2023-06-01 MED ORDER — HYDROCODONE-ACETAMINOPHEN 5-325 MG PO TABS: ORAL_TABLET | ORAL | 0 refills | Status: DC

## 2023-06-01 MED ORDER — AZELASTINE HCL 0.1 % NA SOLN: 2.0000 | Freq: Two times a day (BID) | NASAL | 12 refills | Status: DC

## 2023-06-01 MED ORDER — VITAMIN D (ERGOCALCIFEROL) 1.25 MG (50000 UNIT) PO CAPS: ORAL_CAPSULE | ORAL | 4 refills | Status: DC

## 2023-06-01 MED ORDER — FLUOXETINE HCL 10 MG PO CAPS: 10.0000 mg | ORAL_CAPSULE | Freq: Every day | ORAL | 5 refills | Status: DC

## 2023-06-01 NOTE — Progress Notes (Signed)
Subjective:    Patient ID: Lisa Li, female    DOB: 25-Apr-1966, 56 y.o.   MRN: [id number]  HPI  Virtual Visit via Video Note  I connected with Lisa Li on 06/01/23 at  1:50 PM EDT by a video enabled telemedicine application and verified that I am speaking with the correct person using two identifiers.  Location: Patient: home Provider: home   I discussed the limitations of evaluation and management by telemedicine and the availability of in person appointments. The patient expressed understanding and agreed to proceed.  History of Present Illness:    Observations/Objective:   Assessment and Plan:   Follow Up Instructions:    I discussed the assessment and treatment plan with the patient. The patient was provided an opportunity to ask questions and all were answered. The patient agreed with the plan and demonstrated an understanding of the instructions.   The patient was advised to call back or seek an in-person evaluation if the symptoms worsen or if the condition fails to improve as anticipated.  I provided 15 minutes of non-face-to-face time during this encounter.  Patient does have high blood pressure she takes her medicine every day she seems to be doing well with this She also has diabetes she is taking her metformin and glipizide not having any side effects or problems currently She is trying to watch her diet She does have chronic cough she is uncertain if she has postnasal drainage she has seen a pulmonologist in the past She wondered if her blood pressure medicines could be causing her cough but I think the likelihood of that is low More than likely this is related into either postnasal drip or the possibility of regurgitation she is on a PPI.  Denies any severe regurgitation She does take her Repatha on a regular basis She does have chronic pain mainly in her back knees hands and feet Hydrocodone 4 times per day does allow her to get some relief She  denies having any trouble with the medication She states it does bring the level of the pain down to a more tolerable range without causing drowsiness She does take gabapentin for hot flashes but she thinks that is contributing to her constipation because that is when her constipation occurred She is looking at the possibility of tapering down or off of this medicine   Review of Systems     Objective:   Physical Exam .b20      Assessment & Plan:  1. Elevated liver enzymes We will repeat liver enzymes before next visit  2. Other fatigue Recent thyroid test by her GI doctor looked good  3. Long-term current use of opiate analgesic The patient was seen in followup for chronic pain. A review over at their current pain status was discussed. Drug registry was checked. Prescriptions were given.  Regular follow-up recommended. Discussion was held regarding the importance of compliance with medication as well as pain medication contract.  Patient was informed that medication may cause drowsiness and should not be combined  with other medications/alcohol or street drugs. If the patient feels medication is causing altered alertness then do not drive or operate dangerous equipment.  Should be noted that the patient appears to be meeting appropriate use of opioids and response.  Evidenced by improved function and decent pain control without significant side effects and no evidence of overt aberrancy issues.  Upon discussion with the patient today they understand that opioid therapy is optional and they feel  that the pain has been refractory to reasonable conservative measures and is significant and affecting quality of life enough to warrant ongoing therapy and wishes to continue opioids.  Refills were provided.  [facility] medical Board guidelines regarding the pain medicine has been reviewed.  CDC guidelines most updated 2022 has been reviewed by the prescriber.  PDMP is checked on a regular  basis yearly urine drug screen and pain management contract   4. Vitamin D deficiency Vitamin D reviewed we will check level again in 3 to 4 months  5. Controlled type 2 diabetes mellitus without complication, without long-term current use of insulin ([facility]) Will check A1c healthy diet recommended continue medication  6. Hyperlipidemia associated with type 2 diabetes mellitus ([facility]) Continue Repatha check lipid profile  7. Attention deficit disorder, unspecified hyperactivity presence Patient states she is trying to stay away from this medication for now we will hold off on this medicine  8. Essential hypertension Blood pressure doing well but it is not really felt that her medication is causing her to have cough we will stick with the current medicine  9. Chronic cough Most likely this is postnasal drip causing this but certainly other things could be causing it if it gets worse we will get her in with ENT or perhaps back with pulmonary but for now it is reasonable to just follow-up

## 2023-08-06 ENCOUNTER — Ambulatory Visit (INDEPENDENT_AMBULATORY_CARE_PROVIDER_SITE_OTHER): Payer: Managed Care, Other (non HMO) | Admitting: Nurse Practitioner

## 2023-08-06 ENCOUNTER — Encounter: Payer: Self-pay | Admitting: Nurse Practitioner

## 2023-08-06 VITALS — BP 143/95 | HR 81 | Temp 98.1°F | Ht 63.0 in | Wt 174.6 lb

## 2023-08-06 DIAGNOSIS — I10 Essential (primary) hypertension: Secondary | ICD-10-CM | POA: Diagnosis not present

## 2023-08-06 DIAGNOSIS — K5792 Diverticulitis of intestine, part unspecified, without perforation or abscess without bleeding: Secondary | ICD-10-CM | POA: Diagnosis not present

## 2023-08-06 DIAGNOSIS — E119 Type 2 diabetes mellitus without complications: Secondary | ICD-10-CM

## 2023-08-06 DIAGNOSIS — Z130 Encounter for screening for diseases of the blood and blood-forming organs and certain disorders involving the immune mechanism: Secondary | ICD-10-CM

## 2023-08-06 DIAGNOSIS — Z1329 Encounter for screening for other suspected endocrine disorder: Secondary | ICD-10-CM

## 2023-08-06 DIAGNOSIS — E1169 Type 2 diabetes mellitus with other specified complication: Secondary | ICD-10-CM

## 2023-08-06 DIAGNOSIS — E785 Hyperlipidemia, unspecified: Secondary | ICD-10-CM

## 2023-08-06 MED ORDER — FLUCONAZOLE 150 MG PO TABS: ORAL_TABLET | ORAL | 0 refills | Status: DC

## 2023-08-06 MED ORDER — SULFAMETHOXAZOLE-TRIMETHOPRIM 800-160 MG PO TABS: 1.0000 | ORAL_TABLET | Freq: Two times a day (BID) | ORAL | 0 refills | Status: DC

## 2023-08-06 NOTE — Patient Instructions (Signed)
[place]

## 2023-08-08 ENCOUNTER — Encounter: Payer: Self-pay | Admitting: Nurse Practitioner

## 2023-08-08 NOTE — Progress Notes (Signed)
Subjective:    Patient ID: Lisa Li, female    DOB: Oct 18, 1965, 57 y.o.   MRN: [id number]  HPI Presents for complaints of a flareup of her diverticulitis.  Began 3 weeks ago.  Took 5 days of Augmentin initially, symptoms improved but 3 days later they came back.  Describes as a mild flareup.  Also Augmentin triggered some diarrhea.  No watery stools just mainly loose stools.  Continues to have mild left lower quadrant discomfort.  States the flareup began after she ate some cashews.  No fevers.  No blood in her bowel movements.  Had some pain gas and bloating but this has also improved.  Better today but symptoms have not completely resolved.  Taking fluids well.  Voiding normal limit.   Review of Systems  Constitutional:  Negative for fever.  Respiratory:  Negative for cough, chest tightness and shortness of breath.   Cardiovascular:  Negative for chest pain.  Gastrointestinal:  Positive for abdominal pain and diarrhea. Negative for blood in stool and constipation.       Objective:   Physical Exam NAD.  Alert, oriented.  Lungs clear.  Heart regular rate rhythm.  Abdomen soft nondistended with hypoactive bowel sounds x 4.  Moderate left lower quadrant tenderness to deep palpation, no rebound or guarding.  No obvious masses. Today's Vitals   08/06/23 1407  BP: (!) 143/95  Pulse: 81  Temp: 98.1 F (36.7 C)  SpO2: 99%  Weight: 174 lb 9.6 oz (79.2 kg)  Height: 5\' 3"  (1.6 m)   Body mass index is 30.93 kg/m.       Assessment & Plan:   Problem List Items Addressed This Visit       Cardiovascular and Mediastinum   Essential hypertension   Relevant Orders   Comprehensive metabolic panel   Lipid panel     Endocrine   Diabetes type 2, controlled (HCC)   Relevant Orders   Comprehensive metabolic panel   Hemoglobin A1c   Lipid panel   Microalbumin/Creatinine Ratio, Urine   Hyperlipidemia associated with type 2 diabetes mellitus (HCC)   Relevant Orders   Lipid panel      Other   Diverticulitis - Primary   Other Visit Diagnoses     Screening for deficiency anemia       Relevant Orders   CBC with Differential/Platelet   Screening for thyroid disorder       Relevant Orders   TSH      Meds ordered this encounter  Medications   sulfamethoxazole-trimethoprim (BACTRIM DS) 800-160 MG tablet    Sig: Take 1 tablet by mouth 2 (two) times daily.    Dispense:  20 tablet    Refill:  0    Order Specific Question:   Supervising Provider    Answer:   Lilyan Punt A [[id number]]   fluconazole (DIFLUCAN) 150 MG tablet    Sig: One po qd prn yeast infection; may repeat in 3-4 days if needed    Dispense:  2 tablet    Refill:  0    Order Specific Question:   Supervising Provider    Answer:   Lilyan Punt A [[id number]]   Routine labs ordered.  Patient encouraged to follow-up for preventive health physical and chronic disease management. Patient states she cannot take Cipro or metronidazole.  Augmentin gave her diarrhea.  Review of the evidence shows the only monotherapy is another quinolone.  Trial of Bactrim DS as directed.  Fluconazole prescribed per her request  in case vaginal yeast infection.  Also recommend patient start daily bowel probiotic. Warning signs reviewed.  Call back next week if no improvement, sooner if worse.

## 2023-08-09 ENCOUNTER — Ambulatory Visit (INDEPENDENT_AMBULATORY_CARE_PROVIDER_SITE_OTHER): Payer: Managed Care, Other (non HMO) | Admitting: Family Medicine

## 2023-08-09 ENCOUNTER — Ambulatory Visit: Payer: Managed Care, Other (non HMO) | Admitting: Family Medicine

## 2023-08-09 VITALS — BP 114/77 | HR 84 | Temp 98.4°F | Ht 63.0 in | Wt 172.8 lb

## 2023-08-09 DIAGNOSIS — Z79891 Long term (current) use of opiate analgesic: Secondary | ICD-10-CM

## 2023-08-09 DIAGNOSIS — L409 Psoriasis, unspecified: Secondary | ICD-10-CM

## 2023-08-09 DIAGNOSIS — K5792 Diverticulitis of intestine, part unspecified, without perforation or abscess without bleeding: Secondary | ICD-10-CM | POA: Diagnosis not present

## 2023-08-09 DIAGNOSIS — E1169 Type 2 diabetes mellitus with other specified complication: Secondary | ICD-10-CM

## 2023-08-09 DIAGNOSIS — I10 Essential (primary) hypertension: Secondary | ICD-10-CM

## 2023-08-09 DIAGNOSIS — E119 Type 2 diabetes mellitus without complications: Secondary | ICD-10-CM

## 2023-08-09 DIAGNOSIS — E785 Hyperlipidemia, unspecified: Secondary | ICD-10-CM

## 2023-08-09 MED ORDER — CLOBETASOL PROPIONATE 0.05 % EX SOLN: CUTANEOUS | 1 refills | Status: AC

## 2023-08-09 MED ORDER — ONDANSETRON 8 MG PO TBDP: 8.0000 mg | ORAL_TABLET | Freq: Three times a day (TID) | ORAL | 3 refills | Status: AC | PRN

## 2023-08-09 MED ORDER — AMOXICILLIN-POT CLAVULANATE 875-125 MG PO TABS: 1.0000 | ORAL_TABLET | Freq: Two times a day (BID) | ORAL | 0 refills | Status: DC

## 2023-08-09 NOTE — Progress Notes (Signed)
Subjective:    Patient ID: Lisa Li, female    DOB: [birth year], 57 y.o.   MRN: [id number]  HPI Discussed the use of AI scribe software for clinical note transcription with the patient, who gave verbal consent to proceed.  History of Present Illness   The patient, with a history of diabetes and psoriasis, presented with a four-week history of abdominal pain, initially localized to the left side. The pain was associated with a loss of appetite and lethargy, leading to significant time spent in bed. The patient self-medicated with Augmentin, which they had on hand, and reported an improvement in symptoms. However, the relief was temporary, and the pain returned three days after the antibiotic course ended.  The patient then sought medical attention and was prescribed Bactrim. However, the patient reported a delayed response to Bactrim compared to Augmentin, with noticeable improvement only after three days. Additionally, the patient experienced nausea and vomiting approximately 45 minutes after taking Bactrim, which led to a further decrease in appetite and food intake.  The patient also reported constipation, which they found particularly concerning given their current abdominal pain. They expressed a preference for Augmentin over Bactrim, despite the former's side effect of diarrhea, as they felt it provided quicker relief.  In addition to the abdominal symptoms, the patient reported a recent flare-up of psoriasis on the scalp, which lasted for about three months. The flare-up was associated with itching, flaking, and some wetness. The patient also noted a swelling in a bone in their foot, which caused discomfort when walking. These symptoms, along with increased joint pain, particularly in the elbows and shoulders, raised the patient's concern about possible psoriatic arthritis.  The patient's overall health has been affected by these symptoms, leading to the postponement of a scheduled echo  stress test and delay in routine blood work. The patient is currently wearing a heart monitor due to cardiac concerns. Despite these health issues, the patient has been maintaining their work commitments, although they have had to take time off due to the current illness.        Review of Systems     Objective:   Physical Exam Physical Exam   ABDOMEN: Tenderness upon palpation in the epigastric region and left side. Generalized abdominal tenderness.   Lungs are clear respiratory rate normal heart is regular Foot exam there is some prominence in the fifth metatarsal but overall I do not find any evidence of any major bony issues if it persists we will do an x-ray  She does have significant tenderness it is worse in the left lower quadrant but present throughout the abdomen no guarding or rebound        Assessment & Plan:  Assessment and Plan    Diverticulitis Persistent abdominal pain for 4 weeks, initially improved with Augmentin but worsened after completion of the course. Bactrim was initiated but has been poorly tolerated with nausea and vomiting. -Discontinue Bactrim. -Start Augmentin twice daily for 10 days. -If no improvement in 2-3 days, consider CT scan to rule out abscess formation.  Psoriasis/Psoriatic Arthritis Recent flare of psoriasis on the scalp and increased joint pain. A new bony prominence on the foot is causing discomfort. -Consider x-ray of the foot if pain persists. -Consider clobetasol scalp solution for future psoriasis flares. -Consider further workup for psoriatic arthritis if joint pain continues.  General Health Maintenance -Continue Zofran as needed for nausea. -Reschedule echo stress test and complete pending blood work when feeling better.  1. Diverticulitis Augmentin twice daily for the next 10 days if not dramatic improvement in the next few days let us know and we will proceed with getting CT scan  2. Essential hypertension Blood  pressure decent control  3. Controlled type 2 diabetes mellitus without complication, without long-term current use of insulin (HCC) Patient to do lab work soon goal is to keep A1c under good control  4. Hyperlipidemia associated with type 2 diabetes mellitus (HCC) Goal is to keep LDL below 70  5. Long-term current use of opiate analgesic Patient will keep her pain management visit follow-up  6. Psoriasis of scalp Clobetasol as needed She will send [place] notice within 48-72 hours letting us know how her diverticular aspect is doing

## 2023-08-12 ENCOUNTER — Encounter: Payer: Self-pay | Admitting: Family Medicine

## 2023-08-12 NOTE — Telephone Encounter (Signed)
Nurses Please order CT scan abdomen pelvis with contrast Diagnosis diverticulitis failure to improve with antibiotics This needs to be put into the system she is going to get it done with [facility] in [place] in addition to this she also needs a paper order for this she will come by the office tomorrow to pick this up

## 2023-08-13 ENCOUNTER — Telehealth: Payer: Self-pay | Admitting: Family Medicine

## 2023-08-13 ENCOUNTER — Other Ambulatory Visit: Payer: Self-pay

## 2023-08-13 DIAGNOSIS — K5792 Diverticulitis of intestine, part unspecified, without perforation or abscess without bleeding: Secondary | ICD-10-CM

## 2023-08-13 DIAGNOSIS — R1032 Left lower quadrant pain: Secondary | ICD-10-CM

## 2023-08-13 NOTE — Telephone Encounter (Signed)
Orders placed, see telephone note

## 2023-08-13 NOTE — Telephone Encounter (Signed)
Patient called stating sent my chart message about CT scan at [facility] .She will need electronic order put in and a paper order to carry with her to [facility] she is going tomorrow to get it done.

## 2023-08-13 NOTE — Telephone Encounter (Signed)
Spoke with patient regarding, pt requested the order for CT abd / contrast stat, orders faxed to [facility] radiology

## 2023-08-19 ENCOUNTER — Telehealth: Payer: Self-pay | Admitting: Family Medicine

## 2023-08-19 NOTE — Telephone Encounter (Signed)
See MyChart message

## 2023-08-19 NOTE — Telephone Encounter (Signed)
Nurses Please let the patient know that I did get her CT abdomen pelvis results back from [facility] Does not show any acute findings within the abdomen or pelvis Does show fatty liver Any previous diverticulitis is not present currently May stop antibiotics at this point (If ongoing troubles let me know thank you)

## 2023-09-12 ENCOUNTER — Other Ambulatory Visit: Payer: Self-pay | Admitting: Family Medicine

## 2023-09-14 ENCOUNTER — Other Ambulatory Visit: Payer: Self-pay | Admitting: Family Medicine

## 2023-09-21 ENCOUNTER — Ambulatory Visit: Payer: Managed Care, Other (non HMO) | Admitting: Nurse Practitioner

## 2023-09-21 ENCOUNTER — Encounter: Payer: Self-pay | Admitting: Family Medicine

## 2023-09-21 VITALS — BP 124/68 | HR 83 | Ht 63.0 in | Wt 172.0 lb

## 2023-09-21 DIAGNOSIS — K648 Other hemorrhoids: Secondary | ICD-10-CM | POA: Diagnosis not present

## 2023-09-21 DIAGNOSIS — K601 Chronic anal fissure: Secondary | ICD-10-CM

## 2023-09-21 DIAGNOSIS — B9689 Other specified bacterial agents as the cause of diseases classified elsewhere: Secondary | ICD-10-CM | POA: Diagnosis not present

## 2023-09-21 DIAGNOSIS — J069 Acute upper respiratory infection, unspecified: Secondary | ICD-10-CM | POA: Diagnosis not present

## 2023-09-21 MED ORDER — LIDOCAINE 5 % EX OINT: TOPICAL_OINTMENT | CUTANEOUS | 0 refills | Status: AC

## 2023-09-21 MED ORDER — BETAMETHASONE DIPROPIONATE 0.05 % EX CREA: TOPICAL_CREAM | Freq: Two times a day (BID) | CUTANEOUS | 0 refills | Status: DC

## 2023-09-21 MED ORDER — AZITHROMYCIN 250 MG PO TABS
ORAL_TABLET | ORAL | 0 refills | Status: DC
Start: 2023-09-21 — End: 2023-10-05

## 2023-09-22 ENCOUNTER — Encounter: Payer: Self-pay | Admitting: Nurse Practitioner

## 2023-09-22 NOTE — Progress Notes (Signed)
   Subjective:    Patient ID: Lisa Li, female    DOB: [birth year], 57 y.o.   MRN: [id number]  HPI Presents for complaints of head congestion and cough over the past 5 days.  Worse over the past 3 days.  Now producing yellowish mucus.  Scratchy throat.  Ear pressure.  No pain.  Facial pressure.  Headache but improved today.  Slight increase in shortness of breath.  Spells of coughing, sometimes productive.  Has been using her Nasacort nasal spray. Patient is also requesting a refill on her steroid and lidocaine topicals for her chronic hemorrhoids and rectal fissures.  Is being followed by gastroenterology. Social History   Tobacco Use   Smoking status: Never   Smokeless tobacco: Never  Vaping Use   Vaping status: Never Used  Substance Use Topics   Alcohol use: No   Drug use: No      Review of Systems  Constitutional:  Negative for fever.  HENT:  Positive for congestion, postnasal drip and sinus pressure. Negative for ear pain and sore throat.   Respiratory:  Positive for cough and shortness of breath. Negative for chest tightness and wheezing.   Cardiovascular:  Negative for chest pain.       Objective:   Physical Exam NAD.  Alert, oriented.  TMs clear effusion, no erythema.  Nasal mucosa pale and mildly boggy.  Pharynx mildly injected with yellowish PND noted.  Neck supple with mild soft slightly tender anterior cervical adenopathy.  Lungs clear.  Heart regular rate rhythm. Today's Vitals   09/21/23 1603  BP: 124/68  Pulse: 83  SpO2: 98%  Weight: 172 lb (78 kg)  Height: 5\' 3"  (1.6 m)   Body mass index is 30.47 kg/m.       Assessment & Plan:  Bacterial URI Meds ordered this encounter  Medications   azithromycin (ZITHROMAX Z-PAK) 250 MG tablet    Sig: Take 2 tablets (500 mg) on  Day 1,  followed by 1 tablet (250 mg) once daily on Days 2 through 5.    Dispense:  6 each    Refill:  0    Order Specific Question:   Supervising Provider    Answer:   Lilyan Punt  A [[id number]]   betamethasone dipropionate 0.05 % cream    Sig: Apply topically 2 (two) times daily. Prn up to 2 weeks at a time    Dispense:  60 g    Refill:  0    Order Specific Question:   Supervising Provider    Answer:   Lilyan Punt A [[id number]]   lidocaine (XYLOCAINE) 5 % ointment    Sig: Apply to affected area prn    Dispense:  35 g    Refill:  0    Order Specific Question:   Supervising Provider    Answer:   Sherri Rad   Took a course of Augmentin back in October.  Works well but causes stomach upset.  Start Z-Pak as directed.  OTC meds as directed for congestion and cough.  Warning signs reviewed.  Call back early next week if no improvement, sooner if worse. Refills given on betamethasone and lidocaine topicals as requested.  Continue follow-up with specialist as needed.  Last colonoscopy 08/05/2016, recommended repeat in 5 years.  Will advise patient of this. Return if symptoms worsen or fail to improve.

## 2023-09-24 ENCOUNTER — Encounter: Payer: Self-pay | Admitting: Family Medicine

## 2023-09-24 MED ORDER — AMOXICILLIN-POT CLAVULANATE 875-125 MG PO TABS: 1.0000 | ORAL_TABLET | Freq: Two times a day (BID) | ORAL | 0 refills | Status: DC

## 2023-09-24 NOTE — Telephone Encounter (Signed)
May add Augmentin 875 1 twice daily for 7 days  May finish out azithromycin

## 2023-09-29 ENCOUNTER — Encounter: Payer: Self-pay | Admitting: Family Medicine

## 2023-09-29 NOTE — Telephone Encounter (Signed)
Nurses It is hard to know is her illness purely from a bacterial component or a 0 viral component along with bacterial Either way she is on the right medicine but there is no timetable how quickly this will get well Whether or not she works is more up to how she is feeling rather than a pure medical decision if she would like to have a work note for Thursday Friday Saturday Sunday that would be fine  Ra does have a follow-up visit on Christmas Eve that we will give a good opportunity to recheck this issue follow-up sooner if any problems

## 2023-10-01 ENCOUNTER — Other Ambulatory Visit: Payer: Self-pay | Admitting: Nurse Practitioner

## 2023-10-01 MED ORDER — FLUCONAZOLE 150 MG PO TABS: ORAL_TABLET | ORAL | 0 refills | Status: DC

## 2023-10-01 MED ORDER — ALBUTEROL SULFATE HFA 108 (90 BASE) MCG/ACT IN AERS: 2.0000 | INHALATION_SPRAY | Freq: Four times a day (QID) | RESPIRATORY_TRACT | 0 refills | Status: DC | PRN

## 2023-10-05 ENCOUNTER — Ambulatory Visit: Payer: Managed Care, Other (non HMO) | Admitting: Family Medicine

## 2023-10-05 VITALS — BP 110/73 | HR 80 | Temp 97.2°F | Ht 63.0 in | Wt 172.0 lb

## 2023-10-05 DIAGNOSIS — E1169 Type 2 diabetes mellitus with other specified complication: Secondary | ICD-10-CM

## 2023-10-05 DIAGNOSIS — Z7984 Long term (current) use of oral hypoglycemic drugs: Secondary | ICD-10-CM

## 2023-10-05 DIAGNOSIS — Z79891 Long term (current) use of opiate analgesic: Secondary | ICD-10-CM | POA: Diagnosis not present

## 2023-10-05 DIAGNOSIS — E785 Hyperlipidemia, unspecified: Secondary | ICD-10-CM

## 2023-10-05 DIAGNOSIS — E1165 Type 2 diabetes mellitus with hyperglycemia: Secondary | ICD-10-CM

## 2023-10-05 DIAGNOSIS — G894 Chronic pain syndrome: Secondary | ICD-10-CM | POA: Diagnosis not present

## 2023-10-05 DIAGNOSIS — I10 Essential (primary) hypertension: Secondary | ICD-10-CM | POA: Diagnosis not present

## 2023-10-05 LAB — TSH: TSH: 1.79 u[IU]/mL (ref 0.450–4.500)

## 2023-10-05 LAB — COMPREHENSIVE METABOLIC PANEL
ALT: 78 [IU]/L — ABNORMAL HIGH (ref 0–32)
AST: 92 [IU]/L — ABNORMAL HIGH (ref 0–40)
Albumin: 4.8 g/dL (ref 3.8–4.9)
Alkaline Phosphatase: 66 [IU]/L (ref 44–121)
BUN/Creatinine Ratio: 21 (ref 9–23)
BUN: 20 mg/dL (ref 6–24)
Bilirubin Total: 0.5 mg/dL (ref 0.0–1.2)
CO2: 22 mmol/L (ref 20–29)
Calcium: 10.1 mg/dL (ref 8.7–10.2)
Chloride: 100 mmol/L (ref 96–106)
Creatinine, Ser: 0.95 mg/dL (ref 0.57–1.00)
Globulin, Total: 2.3 g/dL (ref 1.5–4.5)
Glucose: 128 mg/dL — ABNORMAL HIGH (ref 70–99)
Potassium: 4.7 mmol/L (ref 3.5–5.2)
Sodium: 138 mmol/L (ref 134–144)
Total Protein: 7.1 g/dL (ref 6.0–8.5)
eGFR: 69 mL/min/{1.73_m2} (ref >59)

## 2023-10-05 LAB — CBC WITH DIFFERENTIAL/PLATELET
Basophils Absolute: 0 10*3/uL (ref 0.0–0.2)
Basos: 1 %
EOS (ABSOLUTE): 0.2 10*3/uL (ref 0.0–0.4)
Eos: 4 %
Hematocrit: 36.7 % (ref 34.0–46.6)
Hemoglobin: 11.3 g/dL (ref 11.1–15.9)
Immature Grans (Abs): 0 10*3/uL (ref 0.0–0.1)
Immature Granulocytes: 0 %
Lymphocytes Absolute: 2.5 10*3/uL (ref 0.7–3.1)
Lymphs: 45 %
MCH: 25.3 pg — ABNORMAL LOW (ref 26.6–33.0)
MCHC: 30.8 g/dL — ABNORMAL LOW (ref 31.5–35.7)
MCV: 82 fL (ref 79–97)
Monocytes Absolute: 0.4 10*3/uL (ref 0.1–0.9)
Monocytes: 7 %
Neutrophils Absolute: 2.3 10*3/uL (ref 1.4–7.0)
Neutrophils: 43 %
Platelets: 210 10*3/uL (ref 150–450)
RBC: 4.47 x10E6/uL (ref 3.77–5.28)
RDW: 13.1 % (ref 11.7–15.4)
WBC: 5.4 10*3/uL (ref 3.4–10.8)

## 2023-10-05 LAB — LIPID PANEL
Chol/HDL Ratio: 3.4 {ratio} (ref 0.0–4.4)
Cholesterol, Total: 136 mg/dL (ref 100–199)
HDL: 40 mg/dL (ref >39)
LDL Chol Calc (NIH): 55 mg/dL (ref 0–99)
Triglycerides: 258 mg/dL — ABNORMAL HIGH (ref 0–149)
VLDL Cholesterol Cal: 41 mg/dL — ABNORMAL HIGH (ref 5–40)

## 2023-10-05 LAB — MICROALBUMIN / CREATININE URINE RATIO
Creatinine, Urine: 146.3 mg/dL
Microalb/Creat Ratio: 6 mg/g{creat} (ref 0–29)
Microalbumin, Urine: 8.8 ug/mL

## 2023-10-05 LAB — HEMOGLOBIN A1C
Est. average glucose Bld gHb Est-mCnc: 214 mg/dL
Hgb A1c MFr Bld: 9.1 % — ABNORMAL HIGH (ref 4.8–5.6)

## 2023-10-05 MED ORDER — HYDROCODONE-ACETAMINOPHEN 5-325 MG PO TABS: ORAL_TABLET | ORAL | 0 refills | Status: DC

## 2023-10-05 MED ORDER — TIRZEPATIDE 2.5 MG/0.5ML ~~LOC~~ SOAJ: 2.5000 mg | SUBCUTANEOUS | 3 refills | Status: DC

## 2023-10-05 MED ORDER — CHLORZOXAZONE 500 MG PO TABS: ORAL_TABLET | ORAL | 4 refills | Status: AC

## 2023-10-05 NOTE — Progress Notes (Signed)
Subjective:    Patient ID: Lisa Li, female    DOB: 1966-07-01, 57 y.o.   MRN: [id number]  HPI This patient was seen today for chronic pain  The medication list was reviewed and updated.   Location of Pain for which the patient has been treated with regarding narcotics: Back pain, knee pain hand pain  Onset of this pain: Present for years   -Compliance with medication: Good compliance with medicine  - Number patient states they take daily: 3 or 4 every day  -Reason for ongoing use of opioids does not get adequate relief with Tylenol or ibuprofen  What other measures have been tried outside of opioids Tylenol, NSAIDs  In the ongoing specialists regarding this condition none currently  -when was the last dose patient took?  Within the past 24 hours  The patient was advised the importance of maintaining medication and not using illegal substances with these.  Here for refills and follow up  The patient was educated that we can provide 3 monthly scripts for their medication, it is their responsibility to follow the instructions.  Side effects or complications from medications: Denies side effects  Patient is aware that pain medications are meant to minimize the severity of the pain to allow their pain levels to improve to allow for better function. They are aware of that pain medications cannot totally remove their pain.  Due for UDT ( at least once per year) (pain management contract is also completed at the time of the UDT): Due on next visit  Scale of 1 to 10 ( 1 is least 10 is most) Your pain level without the medicine: 9 Your pain level with medication 5  Scale 1 to 10 ( 1-helps very little, 10 helps very well) How well does your pain medication reduce your pain so you can function better through out the day? 8  Quality of the pain: Throbbing aching  Persistence of the pain: Present all the time  Modifying factors: Worse with activity    This Results for  orders placed or performed in visit on 08/06/23  CBC with Differential/Platelet   Collection Time: 10/04/23  2:38 PM  Result Value Ref Range   WBC 5.4 3.4 - 10.8 x10E3/uL   RBC 4.47 3.77 - 5.28 x10E6/uL   Hemoglobin 11.3 11.1 - 15.9 g/dL   Hematocrit 95.2 84.1 - 46.6 %   MCV 82 79 - 97 fL   MCH 25.3 (L) 26.6 - 33.0 pg   MCHC 30.8 (L) 31.5 - 35.7 g/dL   RDW 32.4 40.1 - 02.7 %   Platelets 210 150 - 450 x10E3/uL   Neutrophils 43 Not Estab. %   Lymphs 45 Not Estab. %   Monocytes 7 Not Estab. %   Eos 4 Not Estab. %   Basos 1 Not Estab. %   Neutrophils Absolute 2.3 1.4 - 7.0 x10E3/uL   Lymphocytes Absolute 2.5 0.7 - 3.1 x10E3/uL   Monocytes Absolute 0.4 0.1 - 0.9 x10E3/uL   EOS (ABSOLUTE) 0.2 0.0 - 0.4 x10E3/uL   Basophils Absolute 0.0 0.0 - 0.2 x10E3/uL   Immature Granulocytes 0 Not Estab. %   Immature Grans (Abs) 0.0 0.0 - 0.1 x10E3/uL  Comprehensive metabolic panel   Collection Time: 10/04/23  2:38 PM  Result Value Ref Range   Glucose 128 (H) 70 - 99 mg/dL   BUN 20 6 - 24 mg/dL   Creatinine, Ser 2.53 0.57 - 1.00 mg/dL   eGFR 69 >66 YQ/IHK/7.42  BUN/Creatinine Ratio 21 9 - 23   Sodium 138 134 - 144 mmol/L   Potassium 4.7 3.5 - 5.2 mmol/L   Chloride 100 96 - 106 mmol/L   CO2 22 20 - 29 mmol/L   Calcium 10.1 8.7 - 10.2 mg/dL   Total Protein 7.1 6.0 - 8.5 g/dL   Albumin 4.8 3.8 - 4.9 g/dL   Globulin, Total 2.3 1.5 - 4.5 g/dL   Bilirubin Total 0.5 0.0 - 1.2 mg/dL   Alkaline Phosphatase 66 44 - 121 IU/L   AST 92 (H) 0 - 40 IU/L   ALT 78 (H) 0 - 32 IU/L  Hemoglobin A1c   Collection Time: 10/04/23  2:38 PM  Result Value Ref Range   Hgb A1c MFr Bld 9.1 (H) 4.8 - 5.6 %   Est. average glucose Bld gHb Est-mCnc 214 mg/dL  Lipid panel   Collection Time: 10/04/23  2:38 PM  Result Value Ref Range   Cholesterol, Total 136 100 - 199 mg/dL   Triglycerides 119 (H) 0 - 149 mg/dL   HDL 40 >14 mg/dL   VLDL Cholesterol Cal 41 (H) 5 - 40 mg/dL   LDL Chol Calc (NIH) 55 0 - 99 mg/dL    Chol/HDL Ratio 3.4 0.0 - 4.4 ratio  TSH   Collection Time: 10/04/23  2:38 PM  Result Value Ref Range   TSH 1.790 0.450 - 4.500 uIU/mL  Microalbumin/Creatinine Ratio, Urine   Collection Time: 10/04/23  2:38 PM  Result Value Ref Range   Creatinine, Urine 146.3 Not Estab. mg/dL   Microalbumin, Urine 8.8 Not Estab. ug/mL   Microalb/Creat Ratio 6 0 - 29 mg/g creat   Poor diabetes control     Review of Systems     Objective:   Physical Exam General-in no acute distress Eyes-no discharge Lungs-respiratory rate normal, CTA CV-no murmurs,RRR Extremities skin warm dry no edema Neuro grossly normal Behavior normal, alert        Assessment & Plan:  1. Essential hypertension (Primary) Blood pressure good control continue current measures  2. Long-term current use of opiate analgesic The patient was seen in followup for chronic pain. A review over at their current pain status was discussed. Drug registry was checked. Prescriptions were given.  Regular follow-up recommended. Discussion was held regarding the importance of compliance with medication as well as pain medication contract.  Patient was informed that medication may cause drowsiness and should not be combined  with other medications/alcohol or street drugs. If the patient feels medication is causing altered alertness then do not drive or operate dangerous equipment.  Should be noted that the patient appears to be meeting appropriate use of opioids and response.  Evidenced by improved function and decent pain control without significant side effects and no evidence of overt aberrancy issues.  Upon discussion with the patient today they understand that opioid therapy is optional and they feel that the pain has been refractory to reasonable conservative measures and is significant and affecting quality of life enough to warrant ongoing therapy and wishes to continue opioids.  Refills were provided.  [facility] medical  Board guidelines regarding the pain medicine has been reviewed.  CDC guidelines most updated 2022 has been reviewed by the prescriber.  PDMP is checked on a regular basis yearly urine drug screen and pain management contract  Urine drug screen next visit  3. Hyperlipidemia associated with type 2 diabetes mellitus (HCC) Continue Repatha  4. Chronic pain syndrome Pain medication as detailed above  5. Poorly controlled type 2 diabetes mellitus (HCC) After shared discussion Mounjaro low-dose 2.5 patient will give [place] feedback how this is doing over the course the next few weeks For A1c 9.1 despite glipizide and metformin Follow-up 3 months

## 2023-10-08 ENCOUNTER — Encounter: Payer: Self-pay | Admitting: Nurse Practitioner

## 2023-10-15 ENCOUNTER — Telehealth: Payer: Self-pay | Admitting: Pharmacist

## 2023-10-21 NOTE — Telephone Encounter (Signed)
 Still no updates on this PA Let me know if you get a fax! Thanks!

## 2023-10-21 NOTE — Telephone Encounter (Signed)
 Nurses-please be aware that prior approval was put in please watch for the fax

## 2023-10-28 NOTE — Telephone Encounter (Signed)
Received APPROVAL for MOUNJARO--> finally!!  Patient will need 6 month follow up visit scheduled and documentation of the following in the PCP office visit notes:  improvement/progress on Mounjaro to continue on it (A1c, etc) 2.  Please note "patient has tried/failed: Ozempic, Victoza, metformin,  (remains on glipizide) 3. Diagnosis of t2dm showing A1c >6.5% in notes--> "patient's A1c was __9.1_ on _12/23/2024__"

## 2023-10-28 NOTE — Telephone Encounter (Signed)
So noted-you could send her a MyChart message if you wish thank you when I see her in April we can certainly order additional lab work

## 2023-10-28 NOTE — Telephone Encounter (Signed)
Left message to return call to inform patient insurance did finally approve Mounjaro for 6 month trial period - will need a follow up office visit and blood work before the time is up to submit for continuation of coverage.

## 2023-11-01 ENCOUNTER — Encounter: Payer: Self-pay | Admitting: *Deleted

## 2023-11-01 ENCOUNTER — Other Ambulatory Visit: Payer: Self-pay | Admitting: Family Medicine

## 2023-11-01 DIAGNOSIS — E559 Vitamin D deficiency, unspecified: Secondary | ICD-10-CM

## 2023-12-01 ENCOUNTER — Other Ambulatory Visit: Payer: Self-pay | Admitting: Family Medicine

## 2024-01-07 ENCOUNTER — Other Ambulatory Visit: Payer: Self-pay | Admitting: Nurse Practitioner

## 2024-01-07 ENCOUNTER — Encounter: Payer: Self-pay | Admitting: Family Medicine

## 2024-01-07 DIAGNOSIS — Z79899 Other long term (current) drug therapy: Secondary | ICD-10-CM

## 2024-01-07 DIAGNOSIS — I10 Essential (primary) hypertension: Secondary | ICD-10-CM

## 2024-01-07 DIAGNOSIS — E781 Pure hyperglyceridemia: Secondary | ICD-10-CM

## 2024-01-07 DIAGNOSIS — R7401 Elevation of levels of liver transaminase levels: Secondary | ICD-10-CM

## 2024-01-07 DIAGNOSIS — K760 Fatty (change of) liver, not elsewhere classified: Secondary | ICD-10-CM

## 2024-01-07 DIAGNOSIS — E119 Type 2 diabetes mellitus without complications: Secondary | ICD-10-CM

## 2024-01-07 DIAGNOSIS — E1169 Type 2 diabetes mellitus with other specified complication: Secondary | ICD-10-CM

## 2024-01-12 ENCOUNTER — Ambulatory Visit: Payer: Managed Care, Other (non HMO) | Admitting: Family Medicine

## 2024-01-12 VITALS — Ht 63.0 in

## 2024-01-12 DIAGNOSIS — E119 Type 2 diabetes mellitus without complications: Secondary | ICD-10-CM

## 2024-01-12 DIAGNOSIS — F909 Attention-deficit hyperactivity disorder, unspecified type: Secondary | ICD-10-CM

## 2024-01-12 DIAGNOSIS — I10 Essential (primary) hypertension: Secondary | ICD-10-CM

## 2024-01-12 DIAGNOSIS — Z7985 Long-term (current) use of injectable non-insulin antidiabetic drugs: Secondary | ICD-10-CM | POA: Diagnosis not present

## 2024-01-12 DIAGNOSIS — Z23 Encounter for immunization: Secondary | ICD-10-CM

## 2024-01-12 DIAGNOSIS — R5383 Other fatigue: Secondary | ICD-10-CM | POA: Diagnosis not present

## 2024-01-12 DIAGNOSIS — R0781 Pleurodynia: Secondary | ICD-10-CM

## 2024-01-12 DIAGNOSIS — E785 Hyperlipidemia, unspecified: Secondary | ICD-10-CM

## 2024-01-12 DIAGNOSIS — E1169 Type 2 diabetes mellitus with other specified complication: Secondary | ICD-10-CM

## 2024-01-12 DIAGNOSIS — R400 Somnolence: Secondary | ICD-10-CM

## 2024-01-12 DIAGNOSIS — Z79891 Long term (current) use of opiate analgesic: Secondary | ICD-10-CM

## 2024-01-12 MED ORDER — AMOXICILLIN-POT CLAVULANATE 875-125 MG PO TABS: 1.0000 | ORAL_TABLET | Freq: Two times a day (BID) | ORAL | 0 refills | Status: DC

## 2024-01-12 MED ORDER — HYDROCODONE-ACETAMINOPHEN 5-325 MG PO TABS: ORAL_TABLET | ORAL | 0 refills | Status: DC

## 2024-01-12 MED ORDER — HYDROCODONE-ACETAMINOPHEN 5-325 MG PO TABS
ORAL_TABLET | ORAL | 0 refills | Status: DC
Start: 2024-01-12 — End: 2024-04-12

## 2024-01-12 NOTE — Progress Notes (Signed)
 Subjective:    Patient ID: Lisa Li, female    DOB: May 21, 1966, 58 y.o.   MRN: [id number]  HPI 3 month follow up HTN, hyperlipidemia  Pain management  This patient was seen today for chronic pain  The medication list was reviewed and updated.   Location of Pain for which the patient has been treated with regarding narcotics: Patient has arthralgias in the hands also pain in the lower legs and knees worse on workdays  Onset of this pain: Present for years   -Compliance with medication: Good compliance  - Number patient states they take daily: Maximum 4/day  -Reason for ongoing use of opioids NSAIDs Tylenol does not do enough  What other measures have been tried outside of opioids NSAIDs, Tylenol  In the ongoing specialists regarding this condition none currently  -when was the last dose patient took?  Earlier today  The patient was advised the importance of maintaining medication and not using illegal substances with these.  Here for refills and follow up  The patient was educated that we can provide 3 monthly scripts for their medication, it is their responsibility to follow the instructions.  Side effects or complications from medications: Denies side effects  Patient is aware that pain medications are meant to minimize the severity of the pain to allow their pain levels to improve to allow for better function. They are aware of that pain medications cannot totally remove their pain.  Due for UDT ( at least once per year) (pain management contract is also completed at the time of the UDT): Will be due on next visit  Scale of 1 to 10 ( 1 is least 10 is most) Your pain level without the medicine: 8 Your pain level with medication 4  Scale 1 to 10 ( 1-helps very little, 10 helps very well) How well does your pain medication reduce your pain so you can function better through out the day?  7  Quality of the pain: Aching throbbing  Persistence of the pain: Present  all the time  Modifying factors: Worse with activity  Discussed the use of AI scribe software for clinical note transcription with the patient, who gave verbal consent to proceed. Discussed the use of AI scribe software for clinical note transcription with the patient, who gave verbal consent to proceed.  History of Present Illness   Lisa Li is a 58 year old female who presents with fatigue and medication follow-up.  She experiences persistent fatigue throughout the day, ongoing for several months, even before her sister's passing in January. Despite sleeping 9 to 12 hours, she still feels tired, noting improvement when sleeping only 6 to 7 hours. No significant snoring or nocturnal awakenings. She sometimes feels sleepy during inappropriate times, impacting her ability to complete daily tasks.  She has sinus congestion with green nasal discharge and a sore throat, worsening after her friend Consuella Lose became ill. Initially attributed to allergies, her symptoms have intensified. She uses Claritin and Nasacort regularly to manage these symptoms.  She reports rib pain that began 6 to 9 months ago, localized and sore to the touch. Initially daily, the pain has become less frequent. She is concerned about the potential impact of previous radiation treatment on her ribs, as a colleague experienced similar issues post-radiation.  She is unable to obtain her prescribed Mounjaro due to high costs, with insurance requiring significant out-of-pocket expenses per injection. She previously tried Ozempic but experienced adverse effects. She is exploring alternative  options for obtaining Mounjaro at a lower cost.  She takes Repatha twice a month with no noticeable side effects. She also takes pain medication three to four times a day, which she finds effective without causing drowsiness or other adverse effects. She has a history of breast cancer and is concerned about the potential for radiation to weaken  her bones.      Patient does ADD symptoms.  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 Symptoms include excessive sleepiness and lack of energy. - Order lab work to investigate potential causes of fatigue. - Consider sleep apnea evaluation in the future if fatigue persists. - Discuss potential for home sleep study when circumstances allow.  Sinusitis Recent sore throat, head congestion, and green nasal discharge, likely secondary to allergies and sinus infection. - Prescribe antibiotics for suspected sinus infection. - Continue using Nasacort and Claritin for allergy management.  Diabetes Mellitus Unable to afford Mounjaro due to high costs. Previous trial of Ozempic was not tolerated due to side effects. Discussion about alternative medications and patient assistance programs. - Discuss with insurance company about coverage for alternative medications like Trulicity. - Explore patient assistance programs for [facility]. Scientist, forensic pharmacy for potential cost savings. - Consider manufacturer coupons and assistance programs.  Left Rib Pain Intermittent left rib pain, possibly related to previous radiation therapy. Differential includes radiation-induced bone changes or other pathology. - Order left rib x-ray to evaluate for bone pathology. - Consider further imaging if x-ray is inconclusive. - Discuss findings with oncologist if necessary.  General Health Maintenance Due for  pneumococcal vaccination and eye examination. Shingles vaccination is up to date. - Administer pneumococcal 20 vaccine. - Advise scheduling an eye examination.     1. Other fatigue (Primary) Check lab work Sleep study may be a good idea for this patient she will think about it - TSH + free T4  2. Controlled type 2 diabetes mellitus without complication, without long-term current use of insulin (HCC) She will do her lab work in the near future and hopefully will get the results relatively soon  3. Hyperlipidemia associated with type 2 diabetes mellitus (HCC) Continue current treatment await lab results  4. Essential hypertension Blood pressure decent control continue current measures  5. Long-term current use of opiate analgesic The patient was seen in followup for chronic pain. A review over at their current pain status was discussed. Drug registry was checked. Prescriptions were given.  Regular follow-up recommended. Discussion was held regarding the importance of compliance with medication as well as pain medication contract.  Patient was informed that medication may cause drowsiness and should not be combined  with other medications/alcohol or street drugs. If the patient feels medication is causing altered alertness then do not drive or operate dangerous equipment.  Should be noted that the patient appears to be meeting appropriate use of opioids and response.  Evidenced by improved function and decent pain control without significant side effects and no  evidence of overt aberrancy issues.  Upon discussion with the patient today they understand that opioid therapy is optional and they feel that the pain has been refractory to reasonable conservative measures and is significant and affecting quality of life enough to warrant ongoing therapy and wishes to continue opioids.  Refills were provided.  [facility] medical Board guidelines regarding the pain medicine has been reviewed.   CDC guidelines most updated 2022 has been reviewed by the prescriber.  PDMP is checked on a regular basis yearly urine drug screen and pain management contract  Treatment plan for this patient includes #1-gentle stretching exercises as shown daily basis 2.  Mild strength exercises 3 times per week #3 continue pain medications #4 notify us if any digression The patient was seen in followup for chronic pain. A review over at their current pain status was discussed. Drug registry was checked. Prescriptions were given.  Regular follow-up recommended. Discussion was held regarding the importance of compliance with medication as well as pain medication contract.  Patient was informed that medication may cause drowsiness and should not be combined  with other medications/alcohol or street drugs. If the patient feels medication is causing altered alertness then do not drive or operate dangerous equipment.  Should be noted that the patient appears to be meeting appropriate use of opioids and response.  Evidenced by improved function and decent pain control without significant side effects and no evidence of overt aberrancy issues.  Upon discussion with the patient today they understand that opioid therapy is optional and they feel that the pain has been refractory to reasonable conservative measures and is significant and affecting quality of life enough to warrant ongoing therapy and wishes to continue opioids.  Refills were provided.  [facility] medical Board guidelines regarding the pain medicine has been reviewed.  CDC guidelines most updated 2022 has been reviewed by the prescriber.  PDMP is checked on a regular basis yearly urine drug screen and pain management contract  Treatment plan for this patient includes #1-gentle stretching exercises as shown daily basis 2.  Mild strength exercises 3 times per week #3 continue pain medications #4 notify us if any digression Patient benefits from the  medicine.  She denies she will doing urine drug screen on next visit  6. Rib pain on left side She has a history of breast cancer there is no nodule there is just tenderness been present for several months plain x-rays first if the pain persist I recommend considering a bone scan  7. Attention deficit hyperactivity disorder (ADHD), unspecified ADHD type Patient has ADD that goes back to elementary days.  In the past medication helped her she is having a hard time focusing 1 month supply was sent and she will give [place] feedback in a few weeks time how that is doing if it is doing well continue this I recommend only on workdays  8. Daytime somnolence Lab work pending Recommend patient to consider sleep study - TSH + free T4  9. Immunization due Needs update on shot - Pneumococcal conjugate vaccine 20-valent (Prevnar 20)

## 2024-01-25 ENCOUNTER — Encounter: Payer: Self-pay | Admitting: Family Medicine

## 2024-01-30 ENCOUNTER — Encounter: Payer: Self-pay | Admitting: Family Medicine

## 2024-02-27 ENCOUNTER — Other Ambulatory Visit: Payer: Self-pay | Admitting: Family Medicine

## 2024-03-28 ENCOUNTER — Other Ambulatory Visit: Payer: Self-pay | Admitting: Family Medicine

## 2024-03-28 DIAGNOSIS — E559 Vitamin D deficiency, unspecified: Secondary | ICD-10-CM

## 2024-04-11 ENCOUNTER — Ambulatory Visit: Payer: Self-pay | Admitting: Family Medicine

## 2024-04-11 LAB — CBC WITH DIFFERENTIAL/PLATELET
Basophils Absolute: 0 10*3/uL (ref 0.0–0.2)
Basos: 1 %
EOS (ABSOLUTE): 0.2 10*3/uL (ref 0.0–0.4)
Eos: 4 %
Hematocrit: 37.9 % (ref 34.0–46.6)
Hemoglobin: 11.7 g/dL (ref 11.1–15.9)
Immature Grans (Abs): 0 10*3/uL (ref 0.0–0.1)
Immature Granulocytes: 0 %
Lymphocytes Absolute: 1.8 10*3/uL (ref 0.7–3.1)
Lymphs: 37 %
MCH: 26 pg — ABNORMAL LOW (ref 26.6–33.0)
MCHC: 30.9 g/dL — ABNORMAL LOW (ref 31.5–35.7)
MCV: 84 fL (ref 79–97)
Monocytes Absolute: 0.3 10*3/uL (ref 0.1–0.9)
Monocytes: 6 %
Neutrophils Absolute: 2.5 10*3/uL (ref 1.4–7.0)
Neutrophils: 52 %
Platelets: 181 10*3/uL (ref 150–450)
RBC: 4.5 x10E6/uL (ref 3.77–5.28)
RDW: 13.4 % (ref 11.7–15.4)
WBC: 4.9 10*3/uL (ref 3.4–10.8)

## 2024-04-11 LAB — COMPREHENSIVE METABOLIC PANEL WITH GFR
ALT: 63 IU/L — ABNORMAL HIGH (ref 0–32)
AST: 59 IU/L — ABNORMAL HIGH (ref 0–40)
Albumin: 4.6 g/dL (ref 3.8–4.9)
Alkaline Phosphatase: 57 IU/L (ref 44–121)
BUN/Creatinine Ratio: 17 (ref 9–23)
BUN: 16 mg/dL (ref 6–24)
Bilirubin Total: 0.3 mg/dL (ref 0.0–1.2)
CO2: 19 mmol/L — ABNORMAL LOW (ref 20–29)
Calcium: 9.9 mg/dL (ref 8.7–10.2)
Chloride: 98 mmol/L (ref 96–106)
Creatinine, Ser: 0.92 mg/dL (ref 0.57–1.00)
Globulin, Total: 2.7 g/dL (ref 1.5–4.5)
Glucose: 220 mg/dL — ABNORMAL HIGH (ref 70–99)
Potassium: 4.5 mmol/L (ref 3.5–5.2)
Sodium: 137 mmol/L (ref 134–144)
Total Protein: 7.3 g/dL (ref 6.0–8.5)
eGFR: 72 mL/min/{1.73_m2} (ref >59)

## 2024-04-11 LAB — LIPID PANEL
Chol/HDL Ratio: 5.1 ratio — ABNORMAL HIGH (ref 0.0–4.4)
Cholesterol, Total: 203 mg/dL — ABNORMAL HIGH (ref 100–199)
HDL: 40 mg/dL (ref >39)
LDL Chol Calc (NIH): 108 mg/dL — ABNORMAL HIGH (ref 0–99)
Triglycerides: 323 mg/dL — ABNORMAL HIGH (ref 0–149)
VLDL Cholesterol Cal: 55 mg/dL — ABNORMAL HIGH (ref 5–40)

## 2024-04-11 LAB — TSH+FREE T4
Free T4: 1.15 ng/dL (ref 0.82–1.77)
TSH: 2.18 u[IU]/mL (ref 0.450–4.500)

## 2024-04-11 LAB — HEMOGLOBIN A1C
Est. average glucose Bld gHb Est-mCnc: 214 mg/dL
Hgb A1c MFr Bld: 9.1 % — ABNORMAL HIGH (ref 4.8–5.6)

## 2024-04-12 ENCOUNTER — Encounter: Payer: Self-pay | Admitting: Family Medicine

## 2024-04-12 ENCOUNTER — Ambulatory Visit: Admitting: Family Medicine

## 2024-04-12 VITALS — BP 132/81 | HR 84 | Temp 97.7°F | Ht 63.0 in | Wt 167.0 lb

## 2024-04-12 DIAGNOSIS — Z79891 Long term (current) use of opiate analgesic: Secondary | ICD-10-CM

## 2024-04-12 DIAGNOSIS — E785 Hyperlipidemia, unspecified: Secondary | ICD-10-CM

## 2024-04-12 DIAGNOSIS — G720 Drug-induced myopathy: Secondary | ICD-10-CM

## 2024-04-12 DIAGNOSIS — K760 Fatty (change of) liver, not elsewhere classified: Secondary | ICD-10-CM

## 2024-04-12 DIAGNOSIS — I10 Essential (primary) hypertension: Secondary | ICD-10-CM | POA: Diagnosis not present

## 2024-04-12 DIAGNOSIS — R21 Rash and other nonspecific skin eruption: Secondary | ICD-10-CM

## 2024-04-12 DIAGNOSIS — E1165 Type 2 diabetes mellitus with hyperglycemia: Secondary | ICD-10-CM

## 2024-04-12 DIAGNOSIS — E1169 Type 2 diabetes mellitus with other specified complication: Secondary | ICD-10-CM

## 2024-04-12 DIAGNOSIS — F909 Attention-deficit hyperactivity disorder, unspecified type: Secondary | ICD-10-CM

## 2024-04-12 DIAGNOSIS — Z7984 Long term (current) use of oral hypoglycemic drugs: Secondary | ICD-10-CM

## 2024-04-12 MED ORDER — HYDROCODONE-ACETAMINOPHEN 5-325 MG PO TABS: ORAL_TABLET | ORAL | 0 refills | Status: DC

## 2024-04-12 MED ORDER — FLUCONAZOLE 150 MG PO TABS: ORAL_TABLET | ORAL | 1 refills | Status: DC

## 2024-04-12 MED ORDER — BETAMETHASONE DIPROPIONATE 0.05 % EX CREA: TOPICAL_CREAM | Freq: Two times a day (BID) | CUTANEOUS | 0 refills | Status: DC

## 2024-04-12 MED ORDER — GLIPIZIDE ER 10 MG PO TB24: 10.0000 mg | ORAL_TABLET | Freq: Every day | ORAL | 5 refills | Status: DC

## 2024-04-12 MED ORDER — AMOXICILLIN-POT CLAVULANATE 875-125 MG PO TABS: 1.0000 | ORAL_TABLET | Freq: Two times a day (BID) | ORAL | 0 refills | Status: DC

## 2024-04-12 MED ORDER — TERCONAZOLE 0.4 % VA CREA: 1.0000 | TOPICAL_CREAM | Freq: Every day | VAGINAL | 1 refills | Status: DC

## 2024-04-12 NOTE — Addendum Note (Signed)
 Addended by: ALPHONSA HAMILTON A on: 04/12/2024 09:28 PM   Modules accepted: Orders

## 2024-04-12 NOTE — Progress Notes (Signed)
 Subjective:    Patient ID: Lisa Li, female    DOB: 1966/08/19, 59 y.o.   MRN: [id number]  HPI  3 month pain management This patient was seen today for chronic pain  The medication list was reviewed and updated. Discussed the use of AI scribe software for clinical note transcription with the patient, who gave verbal consent to proceed.  History of Present Illness   Lisa Li is a 58 year old female who presents with a persistent itchy rash on her right lower leg.  The rash on her right lower leg has been present for approximately three months and is intensely itchy. She initially thought it was an allergic reaction after mowing the lawn, as she is allergic to poisons. Despite using betamethasone  cream, there has been no improvement. An ER doctor was consulted but could not provide a definitive diagnosis. The rash has not spread but has developed thick skin with crusting.  She has diabetes with a recent A1c of 9.1. She is on metformin  and glipizide , with a recent increase in glipizide  dosage from 5 mg to 10 mg. She cannot afford Mounjaro  due to insurance issues and is attempting to manage her diabetes with diet. She takes magnesium glycinate for cramps and a multivitamin with iron for low iron levels. She consumes honey for allergies, believing it to be low glycemic.  She experiences symptoms suggestive of a diverticulitis flare-up, including bloating, decreased appetite, and spongy stools with mucus, but no blood. These symptoms began three weeks ago after eating a corned beef sandwich on rye. She has shooting abdominal pains and is monitoring her symptoms.  She notices mild ankle swelling, particularly after standing all day at work, which decreases when she is off her feet. She has considered using compression stockings.  She is under significant stress following her sister's death in 2023-11-14 and preparing a house for a friend with a terminal illness. She takes Vicodin for pain  and has resumed a small amount of Adderall for work.       Location of Pain for which the patient has been treated with regarding narcotics: Patient has chronic back pain she also has significant knee pain  Onset of this pain: Been present for years   -Compliance with medication: Good compliance with medicine  - Number patient states they take daily: Typically 4/day  -Reason for ongoing use of opioids cannot get adequate relief with Tylenol  or NSAIDs  What other measures have been tried outside of opioids Tylenol  NSAIDs  In the ongoing specialists regarding this condition none currently  -when was the last dose patient took?  Within the past 24 hours  The patient was advised the importance of maintaining medication and not using illegal substances with these.  Here for refills and follow up  The patient was educated that we can provide 3 monthly scripts for their medication, it is their responsibility to follow the instructions.  Side effects or complications from medications: No side effects  Patient is aware that pain medications are meant to minimize the severity of the pain to allow their pain levels to improve to allow for better function. They are aware of that pain medications cannot totally remove their pain.  Due for UDT ( at least once per year) (pain management contract is also completed at the time of the UDT): On next visit  Scale of 1 to 10 ( 1 is least 10 is most) Your pain level without the medicine: 8 Your pain level  with medication 5  Scale 1 to 10 ( 1-helps very little, 10 helps very well) How well does your pain medication reduce your pain so you can function better through out the day?  8  Quality of the pain: Throbbing aching  Persistence of the pain: Present all time  Modifying factors: Worse with activity      Tired, LLQ discomfort, decreased appetite - hx of diverticulitis  Review of Systems     Objective:   Physical Exam  General-in no  acute distress Eyes-no discharge Lungs-respiratory rate normal, CTA CV-no murmurs,RRR Extremities skin warm dry no edema Neuro grossly normal Behavior normal, alert Diabetic foot exam normal      Assessment & Plan:  .Assessment and Plan    Type 2 Diabetes Mellitus with Poor Glycemic Control A1c at 9.1% indicates poor control. Discussed increasing glipizide  and frequent glucose monitoring. Considered alternative options for Mounjaro  or similar medications. Emphasized importance of glycemic control to prevent complications. Patient relates/Discussed potential use of Zepbound , not FDA-approved for diabetics. Consider endocrinology referral if needed. Discussed insulin if control does not improve. - Increase glipizide  dosage from 5 mg to 10 mg. - Monitor blood glucose levels more frequently, especially in the morning and evening. - Consider endocrinology referral if alternative medication options are not feasible. - Explore options for obtaining Mounjaro  or similar medications through [facility] or a specialty pharmacy. - Avoid honey due to its impact on blood glucose levels.  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 Differential includes lichen planus. Dermatology referral recommended. - Refer to dermatology for further evaluation and possible biopsy.  Diverticulitis Symptoms consistent with diverticulitis flare-up. Recommended antibiotics due to symptom duration. Cannot tolerate metronidazole , so Augmentin  will be used. - Prescribe Augmentin  twice daily for 10 days. - Monitor symptoms and contact the office if no improvement within three days. - Consider imaging if symptoms worsen.  Peripheral Edema Mild ankle swelling after prolonged standing, decreases with rest. Attributed to age-related venous changes. Recommended compression stockings. - Use knee-high compression stockings during work to manage swelling.  Chronic Pain  Management Experiences chronic pain, using Vicodin for management. Reports taking full prescribed amount without needing more. - Prescribe Vicodin as needed for pain management.  General Health Maintenance Taking amlodipine , metformin , and Repatha. Reviewed labs, noted potential liver enzyme improvement with better glycemic control. Encouraged continuation of current medications and lifestyle modifications. - Continue amlodipine , metformin , and Repatha as prescribed. - Continue multivitamin with iron and magnesium glycinate. - Re-evaluate blood work in October.  Follow-up Advised updates on diabetes management, dermatology referral, and developments regarding Mounjaro  or alternatives. - Provide an update on diabetes management and medication changes in 2-3 weeks. - Update on dermatology referral and any new developments regarding Mounjaro  or alternative medications.      1. Hyperlipidemia associated with type 2 diabetes mellitus (HCC) (Primary) On Repatha Cannot tolerate statins Under reasonable control  2. Long-term current use of opiate analgesic The patient was seen in followup for chronic pain. A review over at their current pain status was discussed. Drug registry was checked. Prescriptions were given.  Regular follow-up recommended. Discussion was held regarding the importance of compliance with medication as well as pain medication contract.  Patient was informed that medication may cause drowsiness and should not be combined  with other medications/alcohol or street drugs. If the patient feels medication is causing altered alertness then do not drive or operate dangerous equipment.  Should be noted that the  patient appears to be meeting appropriate use of opioids and response.  Evidenced by improved function and decent pain control without significant side effects and no evidence of overt aberrancy issues.  Upon discussion with the patient today they understand that opioid  therapy is optional and they feel that the pain has been refractory to reasonable conservative measures and is significant and affecting quality of life enough to warrant ongoing therapy and wishes to continue opioids.  Refills were provided.  [place]  medical Board guidelines regarding the pain medicine has been reviewed.  CDC guidelines most updated 2022 has been reviewed by the prescriber.  PDMP is checked on a regular basis yearly urine drug screen and pain management contract  Treatment plan for this patient includes #1-gentle stretching exercises as shown daily basis 2.  Mild strength exercises 3 times per week #3 continue pain medications #4 notify us  if any digression   3. Essential hypertension Blood pressure decent control continue current measures  4. Attention deficit hyperactivity disorder (ADHD), unspecified ADHD type Occasional ADD med  5. Fatty liver Minimizing sugars as best as possible get things under better control GLP-1 would certainly help her hepatosteatosis.  6. Poorly controlled type 2 diabetes mellitus (HCC) Bump up dose of glipizide  10 mg Patient will look into trying to get Mounjaro  She will message us  accordingly  7. Rash Recommend referral to dermatology if it does not improve over the next 2 weeks  To do lab work in approximately 3 months

## 2024-04-13 ENCOUNTER — Ambulatory Visit: Payer: Self-pay | Admitting: Nurse Practitioner

## 2024-04-13 ENCOUNTER — Other Ambulatory Visit: Payer: Self-pay

## 2024-04-13 DIAGNOSIS — R7401 Elevation of levels of liver transaminase levels: Secondary | ICD-10-CM

## 2024-04-13 DIAGNOSIS — E1169 Type 2 diabetes mellitus with other specified complication: Secondary | ICD-10-CM

## 2024-04-13 DIAGNOSIS — E1165 Type 2 diabetes mellitus with hyperglycemia: Secondary | ICD-10-CM

## 2024-04-26 ENCOUNTER — Telehealth: Payer: Self-pay

## 2024-04-26 NOTE — Telephone Encounter (Signed)
 Pt stopped by message that was sent through about her flare-up

## 2024-04-27 ENCOUNTER — Other Ambulatory Visit: Payer: Self-pay | Admitting: Family Medicine

## 2024-04-27 MED ORDER — PREDNISONE 20 MG PO TABS: ORAL_TABLET | ORAL | 0 refills | Status: DC

## 2024-05-29 ENCOUNTER — Other Ambulatory Visit: Payer: Self-pay | Admitting: Family Medicine

## 2024-07-18 ENCOUNTER — Ambulatory Visit: Payer: Self-pay | Admitting: Family Medicine

## 2024-07-18 ENCOUNTER — Ambulatory Visit: Admitting: Family Medicine

## 2024-07-18 ENCOUNTER — Encounter: Payer: Self-pay | Admitting: Family Medicine

## 2024-07-18 VITALS — BP 135/82 | HR 87 | Temp 97.5°F | Ht 63.0 in | Wt 166.0 lb

## 2024-07-18 DIAGNOSIS — I10 Essential (primary) hypertension: Secondary | ICD-10-CM | POA: Diagnosis not present

## 2024-07-18 DIAGNOSIS — E1169 Type 2 diabetes mellitus with other specified complication: Secondary | ICD-10-CM | POA: Diagnosis not present

## 2024-07-18 DIAGNOSIS — K760 Fatty (change of) liver, not elsewhere classified: Secondary | ICD-10-CM

## 2024-07-18 DIAGNOSIS — R7401 Elevation of levels of liver transaminase levels: Secondary | ICD-10-CM | POA: Diagnosis not present

## 2024-07-18 DIAGNOSIS — E785 Hyperlipidemia, unspecified: Secondary | ICD-10-CM

## 2024-07-18 DIAGNOSIS — Z79891 Long term (current) use of opiate analgesic: Secondary | ICD-10-CM

## 2024-07-18 DIAGNOSIS — E1165 Type 2 diabetes mellitus with hyperglycemia: Secondary | ICD-10-CM | POA: Diagnosis not present

## 2024-07-18 LAB — HEPATIC FUNCTION PANEL
ALT: 95 IU/L — ABNORMAL HIGH (ref 0–32)
AST: 82 IU/L — ABNORMAL HIGH (ref 0–40)
Albumin: 4.6 g/dL (ref 3.8–4.9)
Alkaline Phosphatase: 54 IU/L (ref 49–135)
Bilirubin Total: 0.3 mg/dL (ref 0.0–1.2)
Bilirubin, Direct: 0.11 mg/dL (ref 0.00–0.40)
Total Protein: 7.2 g/dL (ref 6.0–8.5)

## 2024-07-18 LAB — LIPID PANEL
Chol/HDL Ratio: 3.8 ratio (ref 0.0–4.4)
Cholesterol, Total: 149 mg/dL (ref 100–199)
HDL: 39 mg/dL — ABNORMAL LOW (ref >39)
LDL Chol Calc (NIH): 70 mg/dL (ref 0–99)
Triglycerides: 243 mg/dL — ABNORMAL HIGH (ref 0–149)
VLDL Cholesterol Cal: 40 mg/dL (ref 5–40)

## 2024-07-18 LAB — HEMOGLOBIN A1C
Est. average glucose Bld gHb Est-mCnc: 214 mg/dL
Hgb A1c MFr Bld: 9.1 % — ABNORMAL HIGH (ref 4.8–5.6)

## 2024-07-18 MED ORDER — TERCONAZOLE 0.4 % VA CREA: 1.0000 | TOPICAL_CREAM | Freq: Every day | VAGINAL | 1 refills | Status: AC

## 2024-07-18 MED ORDER — GLIPIZIDE ER 10 MG PO TB24: 10.0000 mg | ORAL_TABLET | Freq: Every day | ORAL | 5 refills | Status: AC

## 2024-07-18 MED ORDER — HYDROCODONE-ACETAMINOPHEN 5-325 MG PO TABS: ORAL_TABLET | ORAL | 0 refills | Status: DC

## 2024-07-18 MED ORDER — FLUOXETINE HCL 10 MG PO CAPS: 10.0000 mg | ORAL_CAPSULE | Freq: Every day | ORAL | 5 refills | Status: AC

## 2024-07-18 MED ORDER — GABAPENTIN 100 MG PO CAPS: ORAL_CAPSULE | ORAL | 5 refills | Status: AC

## 2024-07-18 MED ORDER — BETAMETHASONE DIPROPIONATE 0.05 % EX CREA: TOPICAL_CREAM | Freq: Two times a day (BID) | CUTANEOUS | 0 refills | Status: DC

## 2024-07-18 NOTE — Progress Notes (Signed)
 Subjective:    Patient ID: Lisa Li, female    DOB: 1965/12/08, 58 y.o.   MRN: [id number]  HPI  Pain management and medication refills  A1c results - will need a written prescription for freestyle libre 3 plus monitor or sensors  This patient was seen today for chronic pain  The medication list was reviewed and updated.   Location of Pain for which the patient has been treated with regarding narcotics: Lumbar pain hips knees  Onset of this pain: Present for years   -Compliance with medication: Good compliance  - Number patient states they take daily: Sometimes 3 sometimes 4  -Reason for ongoing use of opioids does not get adequate relief with Tylenol  or NSAIDs  What other measures have been tried outside of opioids Tylenol  NSAIDs  In the ongoing specialists regarding this condition none currently has seen orthopedics in the past  -when was the last dose patient took?  Past 24 hours  The patient was advised the importance of maintaining medication and not using illegal substances with these.  Here for refills and follow up  The patient was educated that we can provide 3 monthly scripts for their medication, it is their responsibility to follow the instructions.  Side effects or complications from medications: Denies side effects  Patient is aware that pain medications are meant to minimize the severity of the pain to allow their pain levels to improve to allow for better function. They are aware of that pain medications cannot totally remove their pain.  Due for UDT ( at least once per year) (pain management contract is also completed at the time of the UDT): November 2023  Patient relates pain medicine does help alleviate her pain get it more under reasonable control denies abusing the medicine  quality of the pain: Throbbing aching  Persistence of the pain: Present all time worse with activity  Modifying factors: Worse with activity  Very nice patient Has  underlying diabetes Cannot afford GLP-1's Is taking her metformin  and glipizide  She is now using a continuous glucose monitor and she states it greatly helps her keep track of how she is doing with her reading Unfortunately her insurance will not cover this because she is not on insulin But she states that her insurance told her that if we would write a letter stating it was medically necessary for her there is a possibility they may approve it     Review of Systems     Objective:   Physical Exam General-in no acute distress Eyes-no discharge Lungs-respiratory rate normal, CTA CV-no murmurs,RRR Extremities skin warm dry no edema Neuro grossly normal Behavior normal, alert        Assessment & Plan:  1. Hyperlipidemia associated with type 2 diabetes mellitus (HCC) (Primary) Continue Repatha Healthy diet   2. Elevated transaminase level On next testing today additional testing to rule out possibility of developing fibrosis  3. Poorly controlled type 2 diabetes mellitus (HCC) Try to get A1c under better control she is using continuous glucose monitors it is very helpful for her in my opinion medically necessary but so far her insurance is not covering it we will see if insurance/pharmacy team might be able to get it approved not sure-slightly pessimistic goal is to get A1c near 7 she feels if she does really good job on her diet and activity she will not have to go up on any medicines so far she cannot afford GLP-1's  4. Essential hypertension Blood pressure  decent control continue current measures  5. Fatty liver Liver enzymes are elevated Will do fibrosis testing on next labs  6. Long-term current use of opiate analgesic The patient was seen in followup for chronic pain. A review over at their current pain status was discussed. Drug registry was checked. Prescriptions were given.  Regular follow-up recommended. Discussion was held regarding the importance of compliance  with medication as well as pain medication contract.  Patient was informed that medication may cause drowsiness and should not be combined  with other medications/alcohol or street drugs. If the patient feels medication is causing altered alertness then do not drive or operate dangerous equipment.  Should be noted that the patient appears to be meeting appropriate use of opioids and response.  Evidenced by improved function and decent pain control without significant side effects and no evidence of overt aberrancy issues.  Upon discussion with the patient today they understand that opioid therapy is optional and they feel that the pain has been refractory to reasonable conservative measures and is significant and affecting quality of life enough to warrant ongoing therapy and wishes to continue opioids.  Refills were provided.  [place]  medical Board guidelines regarding the pain medicine has been reviewed.  CDC guidelines most updated 2022 has been reviewed by the prescriber.  PDMP is checked on a regular basis yearly urine drug screen and pain management contract  Treatment plan for this patient includes #1-gentle stretching exercises as shown daily basis 2.  Mild strength exercises 3 times per week #3 continue pain medications #4 notify us  if any digression Patient was encouraged not to take any additional Tylenol  in addition to her pain meds  Follow-up 3 months labs before that visit

## 2024-08-26 ENCOUNTER — Other Ambulatory Visit: Payer: Self-pay | Admitting: Family Medicine

## 2024-08-26 DIAGNOSIS — E559 Vitamin D deficiency, unspecified: Secondary | ICD-10-CM

## 2024-09-28 LAB — HM MAMMOGRAPHY

## 2024-10-03 ENCOUNTER — Other Ambulatory Visit: Payer: Self-pay | Admitting: Family Medicine

## 2024-10-10 ENCOUNTER — Encounter: Payer: Self-pay | Admitting: Family Medicine

## 2024-10-19 ENCOUNTER — Other Ambulatory Visit: Payer: Self-pay | Admitting: Family Medicine

## 2024-10-24 ENCOUNTER — Ambulatory Visit: Admitting: Family Medicine

## 2024-10-24 VITALS — BP 118/77 | HR 94 | Temp 98.2°F | Ht 63.0 in | Wt 166.5 lb

## 2024-10-24 DIAGNOSIS — K760 Fatty (change of) liver, not elsewhere classified: Secondary | ICD-10-CM | POA: Diagnosis not present

## 2024-10-24 DIAGNOSIS — M792 Neuralgia and neuritis, unspecified: Secondary | ICD-10-CM

## 2024-10-24 DIAGNOSIS — Z7985 Long-term (current) use of injectable non-insulin antidiabetic drugs: Secondary | ICD-10-CM | POA: Diagnosis not present

## 2024-10-24 DIAGNOSIS — R7401 Elevation of levels of liver transaminase levels: Secondary | ICD-10-CM | POA: Diagnosis not present

## 2024-10-24 DIAGNOSIS — K5792 Diverticulitis of intestine, part unspecified, without perforation or abscess without bleeding: Secondary | ICD-10-CM

## 2024-10-24 DIAGNOSIS — E785 Hyperlipidemia, unspecified: Secondary | ICD-10-CM | POA: Diagnosis not present

## 2024-10-24 DIAGNOSIS — Z79891 Long term (current) use of opiate analgesic: Secondary | ICD-10-CM

## 2024-10-24 DIAGNOSIS — I10 Essential (primary) hypertension: Secondary | ICD-10-CM | POA: Diagnosis not present

## 2024-10-24 DIAGNOSIS — E119 Type 2 diabetes mellitus without complications: Secondary | ICD-10-CM

## 2024-10-24 DIAGNOSIS — G720 Drug-induced myopathy: Secondary | ICD-10-CM

## 2024-10-24 DIAGNOSIS — N644 Mastodynia: Secondary | ICD-10-CM | POA: Diagnosis not present

## 2024-10-24 DIAGNOSIS — E1169 Type 2 diabetes mellitus with other specified complication: Secondary | ICD-10-CM | POA: Diagnosis not present

## 2024-10-24 DIAGNOSIS — D472 Monoclonal gammopathy: Secondary | ICD-10-CM

## 2024-10-24 DIAGNOSIS — J019 Acute sinusitis, unspecified: Secondary | ICD-10-CM

## 2024-10-24 DIAGNOSIS — T466X5A Adverse effect of antihyperlipidemic and antiarteriosclerotic drugs, initial encounter: Secondary | ICD-10-CM

## 2024-10-24 MED ORDER — BETAMETHASONE DIPROPIONATE 0.05 % EX CREA: TOPICAL_CREAM | Freq: Two times a day (BID) | CUTANEOUS | 0 refills | Status: DC

## 2024-10-24 MED ORDER — TIRZEPATIDE 2.5 MG/0.5ML ~~LOC~~ SOAJ: 2.5000 mg | SUBCUTANEOUS | 3 refills | Status: AC

## 2024-10-24 MED ORDER — AMOXICILLIN-POT CLAVULANATE 875-125 MG PO TABS: 1.0000 | ORAL_TABLET | Freq: Two times a day (BID) | ORAL | 0 refills | Status: AC

## 2024-10-24 MED ORDER — HYDROCODONE-ACETAMINOPHEN 5-325 MG PO TABS: ORAL_TABLET | ORAL | 0 refills | Status: AC

## 2024-10-24 MED ORDER — BETAMETHASONE DIPROPIONATE 0.05 % EX CREA: TOPICAL_CREAM | Freq: Two times a day (BID) | CUTANEOUS | 2 refills | Status: AC

## 2024-10-24 NOTE — Progress Notes (Signed)
 "  Subjective:    Patient ID: Lisa Li, female    DOB: Feb 26, 1966, 59 y.o.   MRN: [id number]  HPI Patient is in room 7  Patient is here for a 3 month follow up and medication refills  This patient was seen today for chronic pain  The medication list was reviewed and updated.   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  Past 24 hours  The patient was advised the importance of maintaining medication and not using illegal substances with these.  Here for refills and follow up  The patient was educated that we can provide 3 monthly scripts for their medication, it is their responsibility to follow the instructions.  Side effects or complications from medications: No side effects  Patient is aware that pain medications are meant to minimize the severity of the pain to allow their pain levels to improve to allow for better function. They are aware of that pain medications cannot totally remove their pain.  Due for UDT ( at least once per year) (pain management contract is also completed at the time of the UDT): She will need this on the next visit  Scale of 1 to 10 ( 1 is least 10 is most) Your pain level without the medicine: 8-9 Your pain level with medication 4-5  Scale 1 to 10 ( 1-helps very little, 10 helps very well) How well does your pain medication reduce your pain so you can function better through out the day?  8  Quality of the pain: Throbbing aching  stiffness  Persistence of the pain: Present all time  Modifying factors: Worse with activity  Patient has history of MGUS she has not had any worrisome symptoms pointing toward activation of this but needs to have surveillance labs  Patient with left lower quadrant abdominal pain and has a history of diverticulitis symptoms been off and on over the past week  Patient with head congestion sinus pressure sinus soreness along with congestion coughing symptoms over the past couple weeks  Patient with diabetes poor control in the past has not been able to afford advance therapy She is motivated to get this under better control to lessen the risk of long-term disease She takes her current medications  Has hyperlipidemia does not tolerate statins or Repatha  Patient relates neuropathic pain sharp pains in the top of her head and side of her head that come and go and last few seconds at a time does not cause vomiting but sometimes causes nausea denies it in regards to being a true headache this been going on for several weeks now does not wake her up in the melanite  Patient also with fatty liver and liver enzymes elevated which puts her at risk for fibrosis as well cirrhosis is for period time previous elastography back in 2015 did not show cirrhosis or fibrosis but patient at high risk     Patient stated has not got blood work completed   Patient stated  they feel like they are having diverticulitis flare up   Patient also stated they have had sinus issues that have been going on for a week now   Review of Systems     Objective:   Physical Exam General-in no acute distress Eyes-no discharge Lungs-respiratory rate normal, CTA CV-no murmurs,RRR Extremities skin warm dry no edema Neuro grossly normal Behavior normal, alert Abdominal exam tender in the left lower quadrant no guarding or rebound otherwise       Assessment & Plan:  1. Hyperlipidemia associated with type 2 diabetes  mellitus (HCC) (Primary) Continue Repatha to keep cholesterol under good control  2. Diabetes type 2, controlled (HCC) Not under good control currently Needs beyond her current therapy Recommend Mounjaro  Patient did not tolerate Ozempic  but Mounjaro  tends have a better track record hopefully we can get it approved  - tirzepatide  (MOUNJARO ) 2.5 MG/0.5ML Pen; Inject 2.5 mg into the skin once a week.  Dispense: 2 mL; Refill: 3 - Basic metabolic panel with GFR - Hemoglobin A1c - Microalbumin/Creatinine Ratio, Urine  3. Neuropathic pain Neuropathic pain check lab test if ongoing problems or worsening issues consideration for referral to neurology for further evaluation possible Botox - Sedimentation rate - C-reactive protein  4. Fatty liver Patient at high risk of developing fibrosis she has had fatty liver for years unfortunately has complex underlying metabolic diseases GLP-1's would help her Lab test ordered Await results - NASH FibroSure(R) Plus  5. Essential hypertension Blood pressure decent control continue current measures  6. Long-term current use of opiate analgesic The patient was seen in followup for chronic pain. A review over at their current pain status was discussed. Drug registry was checked. Prescriptions were given.  Regular follow-up recommended. Discussion was held regarding the importance of compliance with medication as well as pain medication contract.  Patient was informed that medication may cause drowsiness and should not be combined  with other medications/alcohol or street drugs. If the patient feels medication is causing altered alertness then do not drive or operate dangerous equipment.  Should be noted that the patient appears to be meeting appropriate use of opioids and response.  Evidenced by improved function and decent pain control without significant side effects and no evidence of overt aberrancy issues.  Upon discussion with the patient today  they understand that opioid therapy is optional and they feel that the pain has been refractory to reasonable conservative measures and is significant and affecting quality of life enough to warrant ongoing therapy and wishes to continue opioids.  Refills were provided.  [place]  medical Board guidelines regarding the pain medicine has been reviewed.  CDC guidelines most updated 2022 has been reviewed by the prescriber.  PDMP is checked on a regular basis yearly urine drug screen and pain management contract  Treatment plan for this patient includes #1-gentle stretching exercises as shown daily basis 2.  Mild strength exercises 3 times per week #3 continue pain medications #4 notify us  if any digression  - HYDROcodone -acetaminophen  (NORCO/VICODIN) 5-325 MG tablet; TAKE 1 TABLET BY MOUTH EVERY 4 HOURS AS NEEDED FOR PAIN (MAX 5 PER DAY)  Dispense: 120 tablet; Refill: 0 - HYDROcodone -acetaminophen  (NORCO/VICODIN) 5-325 MG tablet; 1 every 4 hours as needed pain maximum 5/day  Dispense: 120 tablet; Refill: 0 - HYDROcodone -acetaminophen  (NORCO/VICODIN) 5-325 MG tablet; Take one every 4 hours prn pain. Maximum 5 per day  Dispense: 120 tablet; Refill: 0  7. Breast pain She relates breast pain since having a recent biopsy I  told her if this persist she needs to go see the surgeon she related that she might do it diagnostic mammogram or ultrasound I told her that would be fine but if the pain persist she needs to see the surgeon  8. Acute rhinosinusitis Augmentin  twice daily 10 days  9. MGUS (monoclonal gammopathy of unknown significance) Lab work ordered await the results of this.  Warning signs discussed - CBC with Differential/Platelet - Basic metabolic panel with GFR - Sedimentation rate - IFE, PE and FLC, Serum - Kappa/Lambda Light Chains, Free, With Ratio, 24Hr. Urine - Lactate Dehydrogenase  10. Elevated transaminase level Lab work ordered await the results - NASH FibroSure(R)  Plus - CBC with Differential/Platelet  11. 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

## 2024-11-08 ENCOUNTER — Other Ambulatory Visit: Payer: Self-pay | Admitting: Family Medicine

## 2024-11-08 ENCOUNTER — Encounter: Payer: Self-pay | Admitting: Family Medicine

## 2024-11-08 MED ORDER — OSELTAMIVIR PHOSPHATE 75 MG PO CAPS: 75.0000 mg | ORAL_CAPSULE | Freq: Two times a day (BID) | ORAL | 0 refills | Status: AC

## 2024-11-09 ENCOUNTER — Encounter: Payer: Self-pay | Admitting: Family Medicine

## 2024-11-15 ENCOUNTER — Encounter: Payer: Self-pay | Admitting: Family Medicine

## 2024-11-15 NOTE — Telephone Encounter (Signed)
 Please go ahead and give her a work note that excuses her and to allow her to go back to work on Thursday no restrictions

## 2025-01-24 ENCOUNTER — Ambulatory Visit: Admitting: Family Medicine

## 2025-04-24 ENCOUNTER — Ambulatory Visit: Admitting: Family Medicine
